# Patient Record
Sex: Female | Born: 1950 | Race: White | Hispanic: No | Marital: Married | Smoking: Former smoker
Health system: Southern US, Community
[De-identification: ages and names within clinical notes are randomized; demographics above are authoritative.]

## PROBLEM LIST (undated history)

## (undated) DIAGNOSIS — R112 Nausea with vomiting, unspecified: Secondary | ICD-10-CM

## (undated) DIAGNOSIS — K219 Gastro-esophageal reflux disease without esophagitis: Secondary | ICD-10-CM

## (undated) DIAGNOSIS — R51 Headache: Secondary | ICD-10-CM

## (undated) DIAGNOSIS — J302 Other seasonal allergic rhinitis: Secondary | ICD-10-CM

## (undated) DIAGNOSIS — F329 Major depressive disorder, single episode, unspecified: Secondary | ICD-10-CM

## (undated) DIAGNOSIS — J189 Pneumonia, unspecified organism: Secondary | ICD-10-CM

## (undated) DIAGNOSIS — F419 Anxiety disorder, unspecified: Secondary | ICD-10-CM

## (undated) DIAGNOSIS — M1990 Unspecified osteoarthritis, unspecified site: Secondary | ICD-10-CM

## (undated) DIAGNOSIS — D649 Anemia, unspecified: Secondary | ICD-10-CM

## (undated) DIAGNOSIS — Z8489 Family history of other specified conditions: Secondary | ICD-10-CM

## (undated) DIAGNOSIS — Z9889 Other specified postprocedural states: Secondary | ICD-10-CM

## (undated) DIAGNOSIS — M797 Fibromyalgia: Secondary | ICD-10-CM

## (undated) HISTORY — PX: SHOULDER ARTHROSCOPY: SHX128

## (undated) HISTORY — PX: BACK SURGERY: SHX140

## (undated) HISTORY — PX: NECK SURGERY: SHX720

---

## 1982-02-01 HISTORY — PX: ABDOMINAL HYSTERECTOMY: SHX81

## 1998-02-01 DIAGNOSIS — F419 Anxiety disorder, unspecified: Secondary | ICD-10-CM

## 1998-02-01 DIAGNOSIS — F32A Depression, unspecified: Secondary | ICD-10-CM

## 1998-02-01 HISTORY — DX: Depression, unspecified: F32.A

## 1998-02-01 HISTORY — DX: Anxiety disorder, unspecified: F41.9

## 1998-10-15 ENCOUNTER — Encounter: Payer: Self-pay | Admitting: Obstetrics and Gynecology

## 1998-10-15 ENCOUNTER — Ambulatory Visit (HOSPITAL_COMMUNITY): Admission: RE | Admit: 1998-10-15 | Discharge: 1998-10-15 | Payer: Self-pay | Admitting: Obstetrics and Gynecology

## 1999-04-09 ENCOUNTER — Encounter: Payer: Self-pay | Admitting: Neurosurgery

## 1999-04-09 ENCOUNTER — Inpatient Hospital Stay (HOSPITAL_COMMUNITY): Admission: RE | Admit: 1999-04-09 | Discharge: 1999-04-10 | Payer: Self-pay | Admitting: Neurosurgery

## 1999-04-28 ENCOUNTER — Encounter: Payer: Self-pay | Admitting: Neurosurgery

## 1999-04-28 ENCOUNTER — Encounter: Admission: RE | Admit: 1999-04-28 | Discharge: 1999-04-28 | Payer: Self-pay | Admitting: Neurosurgery

## 1999-05-26 ENCOUNTER — Encounter: Admission: RE | Admit: 1999-05-26 | Discharge: 1999-05-26 | Payer: Self-pay | Admitting: Neurosurgery

## 1999-05-26 ENCOUNTER — Encounter: Payer: Self-pay | Admitting: Neurosurgery

## 1999-07-15 ENCOUNTER — Encounter: Admission: RE | Admit: 1999-07-15 | Discharge: 1999-07-15 | Payer: Self-pay | Admitting: Neurosurgery

## 1999-07-15 ENCOUNTER — Encounter: Payer: Self-pay | Admitting: Neurosurgery

## 1999-07-16 ENCOUNTER — Encounter: Admission: RE | Admit: 1999-07-16 | Discharge: 1999-07-16 | Payer: Self-pay | Admitting: Neurosurgery

## 1999-07-16 ENCOUNTER — Encounter: Payer: Self-pay | Admitting: Neurosurgery

## 1999-10-26 ENCOUNTER — Encounter: Admission: RE | Admit: 1999-10-26 | Discharge: 1999-11-13 | Payer: Self-pay | Admitting: Neurosurgery

## 1999-11-20 ENCOUNTER — Ambulatory Visit (HOSPITAL_COMMUNITY): Admission: RE | Admit: 1999-11-20 | Discharge: 1999-11-20 | Payer: Self-pay | Admitting: Obstetrics and Gynecology

## 1999-11-20 ENCOUNTER — Encounter: Payer: Self-pay | Admitting: Obstetrics and Gynecology

## 1999-11-25 ENCOUNTER — Encounter: Admission: RE | Admit: 1999-11-25 | Discharge: 1999-11-25 | Payer: Self-pay | Admitting: Obstetrics and Gynecology

## 1999-11-25 ENCOUNTER — Encounter: Payer: Self-pay | Admitting: Obstetrics and Gynecology

## 2001-02-01 HISTORY — PX: CHOLECYSTECTOMY: SHX55

## 2002-09-14 ENCOUNTER — Encounter: Payer: Self-pay | Admitting: Orthopedic Surgery

## 2002-09-14 ENCOUNTER — Ambulatory Visit (HOSPITAL_COMMUNITY): Admission: RE | Admit: 2002-09-14 | Discharge: 2002-09-14 | Payer: Self-pay | Admitting: Orthopedic Surgery

## 2003-02-02 HISTORY — PX: ERCP W/ SPHINCTEROTOMY AND BALLOON DILATION: SHX1524

## 2003-02-13 ENCOUNTER — Emergency Department (HOSPITAL_COMMUNITY): Admission: EM | Admit: 2003-02-13 | Discharge: 2003-02-13 | Payer: Self-pay | Admitting: Emergency Medicine

## 2003-03-20 ENCOUNTER — Emergency Department (HOSPITAL_COMMUNITY): Admission: EM | Admit: 2003-03-20 | Discharge: 2003-03-20 | Payer: Self-pay | Admitting: Emergency Medicine

## 2003-03-20 ENCOUNTER — Encounter: Payer: Self-pay | Admitting: Gastroenterology

## 2003-03-28 ENCOUNTER — Inpatient Hospital Stay (HOSPITAL_COMMUNITY): Admission: RE | Admit: 2003-03-28 | Discharge: 2003-04-02 | Payer: Self-pay | Admitting: Gastroenterology

## 2003-03-28 ENCOUNTER — Encounter: Payer: Self-pay | Admitting: Gastroenterology

## 2003-04-02 ENCOUNTER — Encounter (INDEPENDENT_AMBULATORY_CARE_PROVIDER_SITE_OTHER): Payer: Self-pay | Admitting: *Deleted

## 2003-05-02 ENCOUNTER — Encounter: Payer: Self-pay | Admitting: Gastroenterology

## 2003-05-02 ENCOUNTER — Ambulatory Visit (HOSPITAL_COMMUNITY): Admission: RE | Admit: 2003-05-02 | Discharge: 2003-05-02 | Payer: Self-pay | Admitting: Gastroenterology

## 2003-10-14 ENCOUNTER — Encounter: Admission: RE | Admit: 2003-10-14 | Discharge: 2003-10-14 | Payer: Self-pay | Admitting: Orthopedic Surgery

## 2003-12-06 ENCOUNTER — Ambulatory Visit: Payer: Self-pay | Admitting: Gastroenterology

## 2004-11-09 ENCOUNTER — Encounter: Admission: RE | Admit: 2004-11-09 | Discharge: 2004-11-09 | Payer: Self-pay | Admitting: Specialist

## 2004-11-19 ENCOUNTER — Ambulatory Visit (HOSPITAL_COMMUNITY): Admission: RE | Admit: 2004-11-19 | Discharge: 2004-11-20 | Payer: Self-pay | Admitting: Specialist

## 2005-10-05 ENCOUNTER — Inpatient Hospital Stay (HOSPITAL_COMMUNITY): Admission: RE | Admit: 2005-10-05 | Discharge: 2005-10-06 | Payer: Self-pay | Admitting: Neurosurgery

## 2006-01-03 ENCOUNTER — Encounter: Admission: RE | Admit: 2006-01-03 | Discharge: 2006-01-03 | Payer: Self-pay | Admitting: Specialist

## 2006-01-21 ENCOUNTER — Encounter: Admission: RE | Admit: 2006-01-21 | Discharge: 2006-01-21 | Payer: Self-pay | Admitting: Specialist

## 2006-03-10 ENCOUNTER — Inpatient Hospital Stay (HOSPITAL_COMMUNITY): Admission: RE | Admit: 2006-03-10 | Discharge: 2006-03-15 | Payer: Self-pay | Admitting: Specialist

## 2007-04-25 ENCOUNTER — Encounter: Payer: Self-pay | Admitting: Gastroenterology

## 2007-10-27 ENCOUNTER — Encounter: Admission: RE | Admit: 2007-10-27 | Discharge: 2007-10-27 | Payer: Self-pay | Admitting: Occupational Medicine

## 2007-11-06 ENCOUNTER — Encounter: Admission: RE | Admit: 2007-11-06 | Discharge: 2007-11-06 | Payer: Self-pay | Admitting: Internal Medicine

## 2007-11-15 ENCOUNTER — Encounter: Admission: RE | Admit: 2007-11-15 | Discharge: 2007-11-15 | Payer: Self-pay | Admitting: Occupational Medicine

## 2007-12-13 ENCOUNTER — Encounter: Admission: RE | Admit: 2007-12-13 | Discharge: 2007-12-13 | Payer: Self-pay | Admitting: Specialist

## 2007-12-20 ENCOUNTER — Encounter: Admission: RE | Admit: 2007-12-20 | Discharge: 2007-12-20 | Payer: Self-pay | Admitting: Specialist

## 2008-11-04 ENCOUNTER — Encounter (INDEPENDENT_AMBULATORY_CARE_PROVIDER_SITE_OTHER): Payer: Self-pay | Admitting: *Deleted

## 2008-11-06 ENCOUNTER — Ambulatory Visit (HOSPITAL_COMMUNITY): Admission: RE | Admit: 2008-11-06 | Discharge: 2008-11-06 | Payer: Self-pay | Admitting: Specialist

## 2008-11-14 DIAGNOSIS — M5030 Other cervical disc degeneration, unspecified cervical region: Secondary | ICD-10-CM | POA: Insufficient documentation

## 2008-11-14 DIAGNOSIS — R51 Headache: Secondary | ICD-10-CM | POA: Insufficient documentation

## 2008-11-14 DIAGNOSIS — Z8659 Personal history of other mental and behavioral disorders: Secondary | ICD-10-CM | POA: Insufficient documentation

## 2008-11-14 DIAGNOSIS — R519 Headache, unspecified: Secondary | ICD-10-CM | POA: Insufficient documentation

## 2008-11-20 ENCOUNTER — Ambulatory Visit: Payer: Self-pay | Admitting: Gastroenterology

## 2008-11-20 DIAGNOSIS — K859 Acute pancreatitis without necrosis or infection, unspecified: Secondary | ICD-10-CM | POA: Insufficient documentation

## 2008-11-20 DIAGNOSIS — N9489 Other specified conditions associated with female genital organs and menstrual cycle: Secondary | ICD-10-CM | POA: Insufficient documentation

## 2009-11-10 IMAGING — CT CT HEAD W/O CM - CT MAXILLOFACIAL W/O CM
3 series · 17 of 47 positions shown, 20 images · non-contrast
Comparison: NONE

CLINICAL DATA: Chronic sinusitis. 

CT PARANASAL SINUSES
TECHNIQUE: Axial, sagittal, and coronal images were obtained.

[Series 3: 3x3 · axial · 0.24mm/px · z∈[+69,+165]mm · 11 of 38 slices shown, 14 images]
[im 3/38  brain]
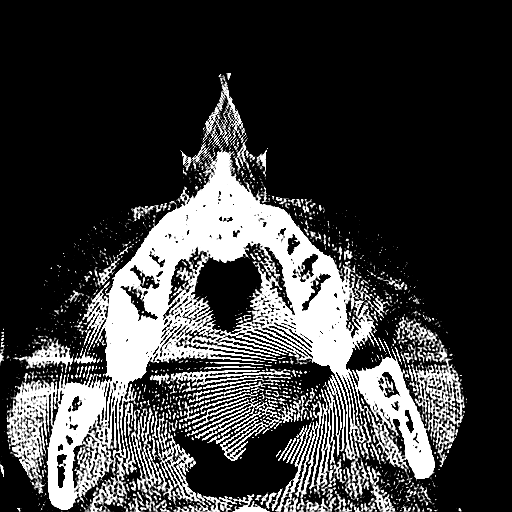
[im 3/38  bone]
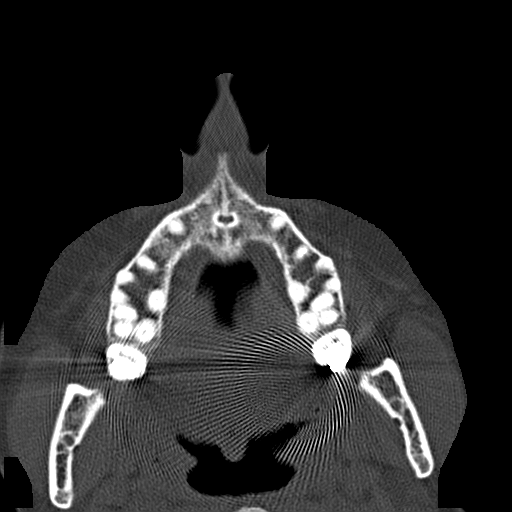
[im 6/38  bone]
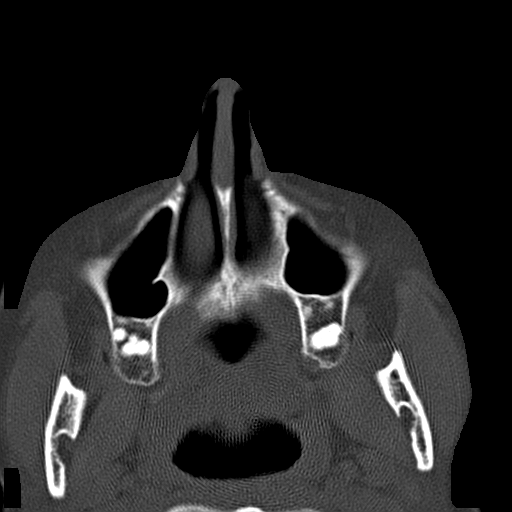
[im 8/38  bone]
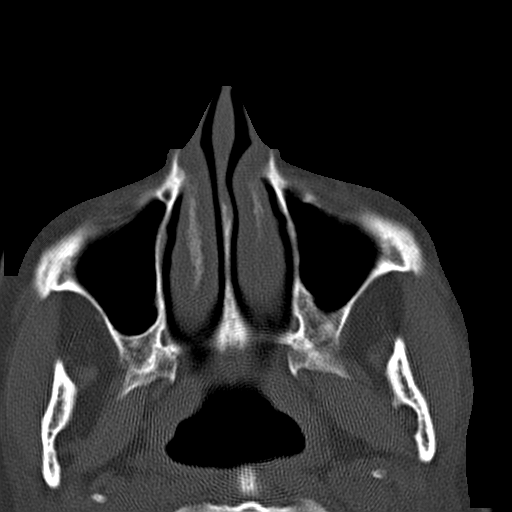
[im 11/38  bone]
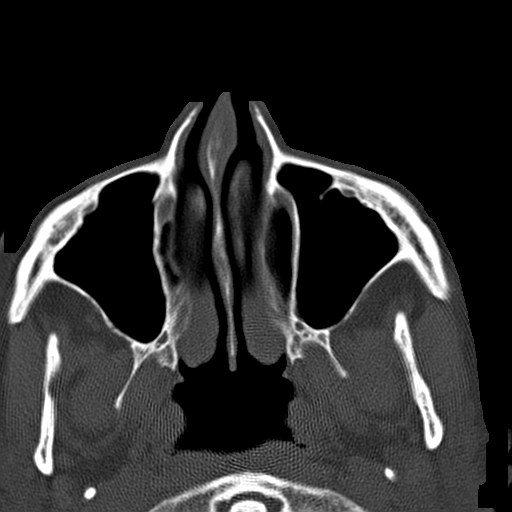
[im 15/38  brain]
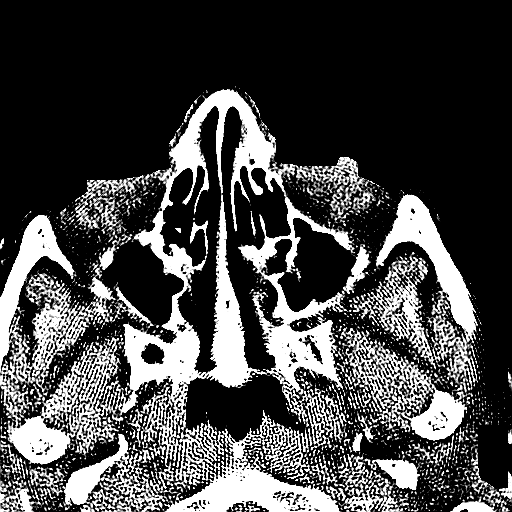
[im 15/38  bone]
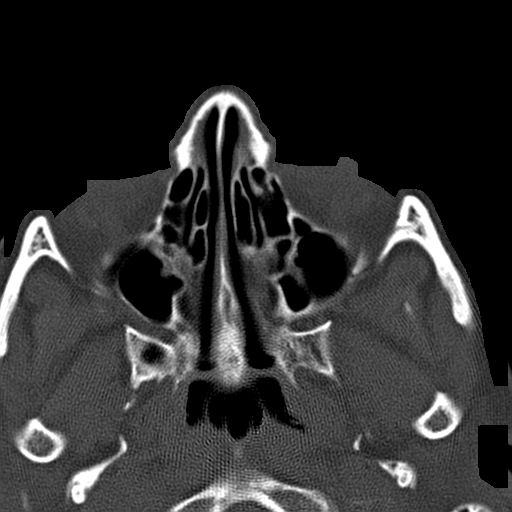
[im 17/38  bone]
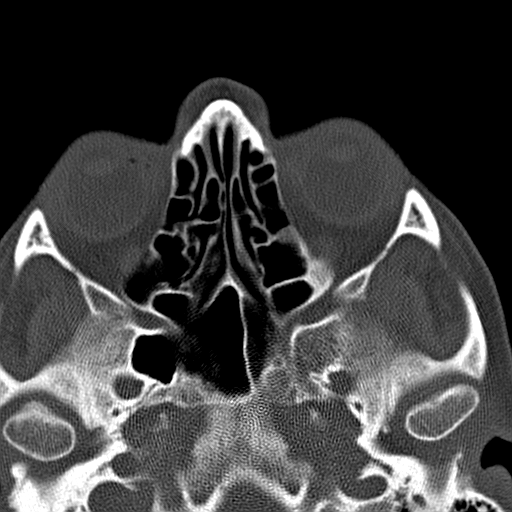
[im 20/38  bone]
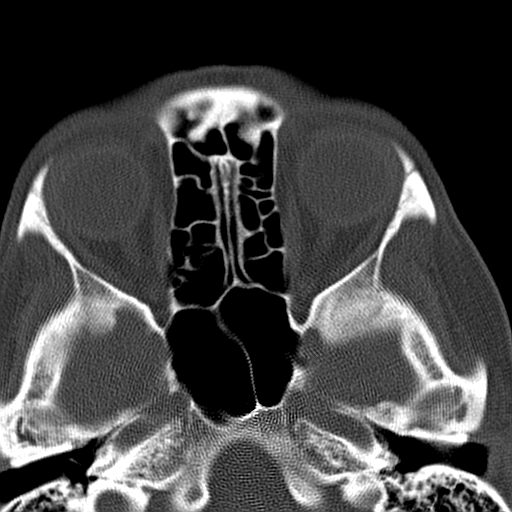
[im 23/38  bone]
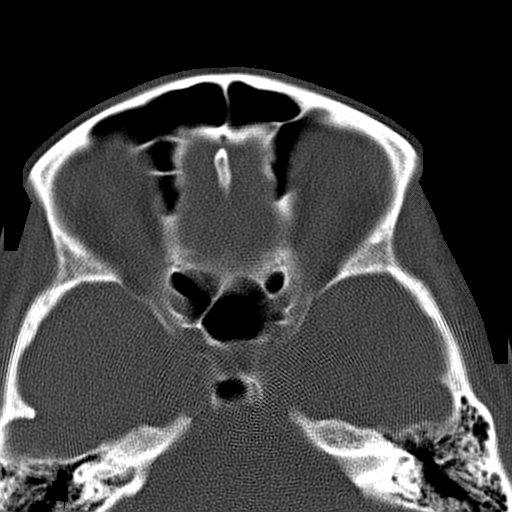
[im 26/38  brain]
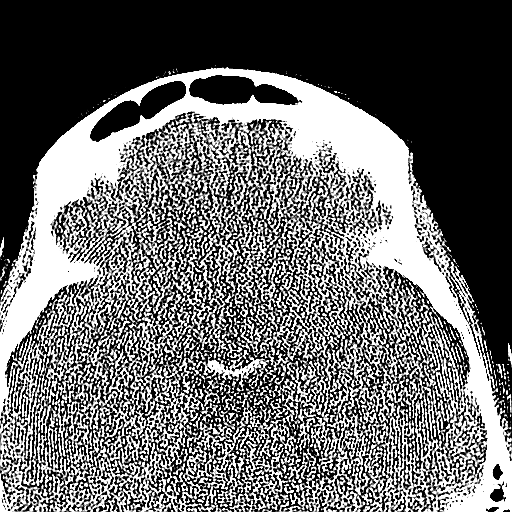
[im 26/38  bone]
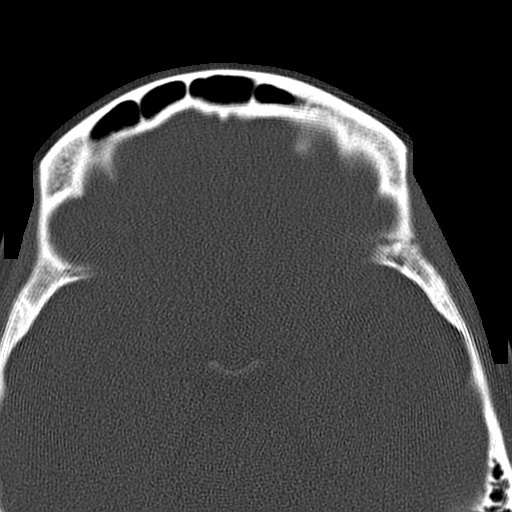
[im 32/38  bone]
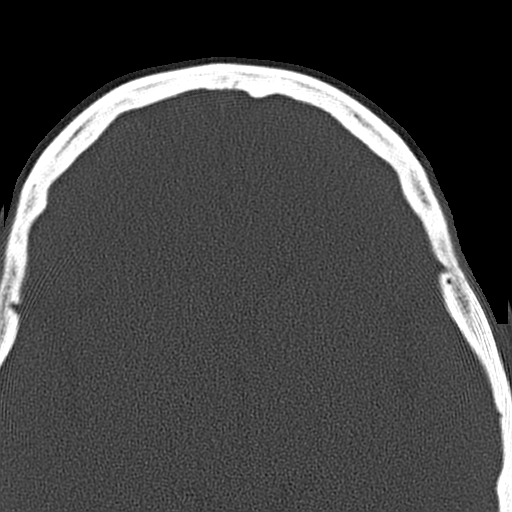
[im 35/38  bone]
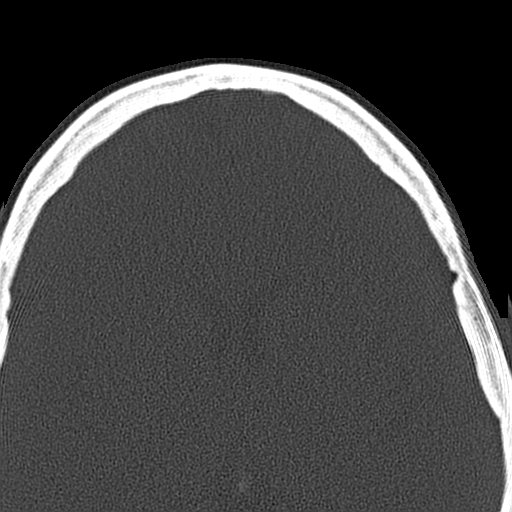

[coronals · coronal · 0.24mm/px · 3 of 32 slices shown]
[im 11/32  bone]
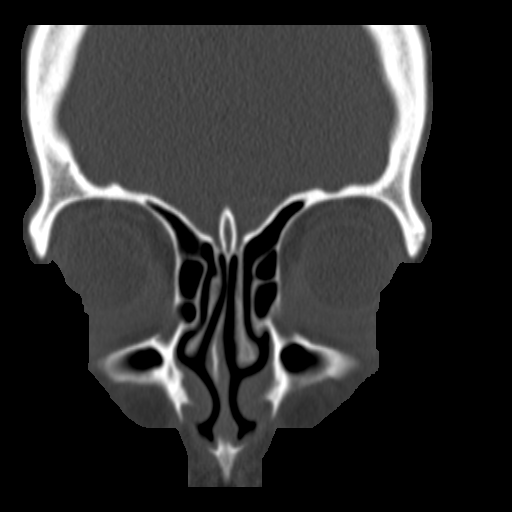
[im 14/32  bone]
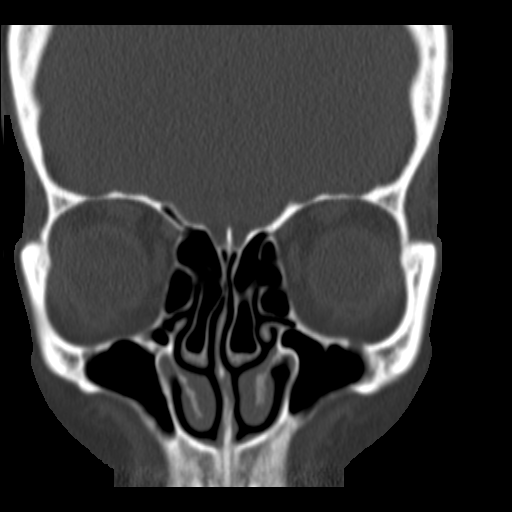
[im 18/32  bone]
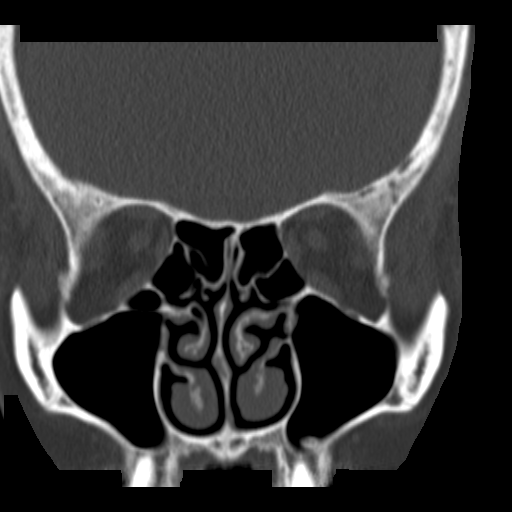

[sagittals · sagittal · 0.24mm/px · 3 of 41 slices shown]
[im 14/41  bone]
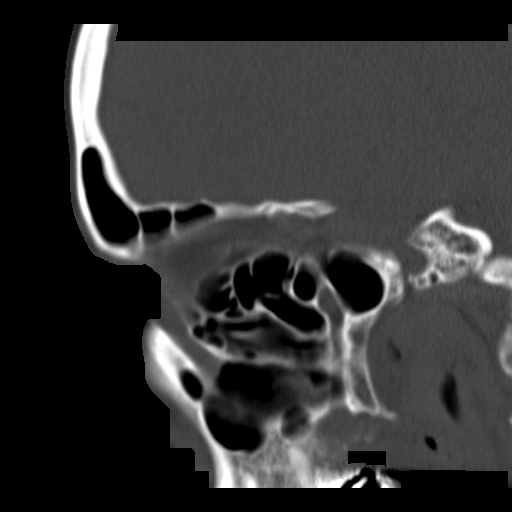
[im 21/41  bone]
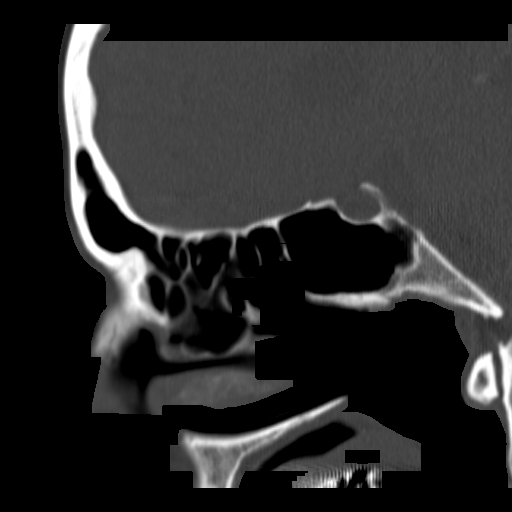
[im 27/41  bone]
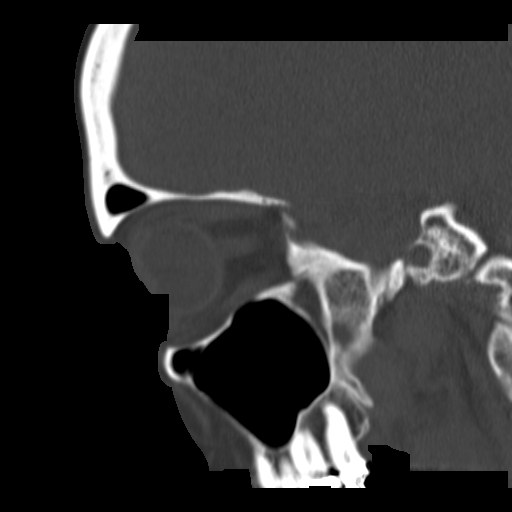

[17 of 47 positions shown; findings below may reference images not displayed]

FINDINGS: No evidence of mucosal thickening or air-fluid level in 
the paranasal sinuses.  Orbits and visualized portions of the 
mastoids are unremarkable. No evidence of occlusion or polyposis 
of the nasal passage.  Small concha bullosa are present.  Minimal 
rightward nasal septal deviation.  Infundibula at the OMC are 
patent bilaterally. No fractures, lytic or blastic lesions are 
identified.
IMPRESSION: Minimal rightward nasal septal deviation.  No 
air-fluid levels or mucosal thickening. Fladby, Hans Ragnar 
electronically reviewed on 08/28/2007 Dict Date: 08/26/2007  Tran 
Date:  08/28/2007 DAS  JLM

## 2010-02-21 ENCOUNTER — Encounter: Payer: Self-pay | Admitting: Gastroenterology

## 2010-05-07 LAB — BASIC METABOLIC PANEL
BUN: 12 mg/dL (ref 6–23)
CO2: 31 mEq/L (ref 19–32)
Calcium: 9.2 mg/dL (ref 8.4–10.5)
Chloride: 101 mEq/L (ref 96–112)
GFR calc Af Amer: >60 mL/min (ref >60)
Glucose, Bld: 111 mg/dL — ABNORMAL HIGH (ref 70–99)
Potassium: 4.6 mEq/L (ref 3.5–5.1)
Sodium: 137 mEq/L (ref 135–145)

## 2010-05-07 LAB — CBC: RDW: 13.1 % (ref 11.5–15.5)

## 2010-06-19 NOTE — H&P (Signed)
[place]  Patient:    [name], [name]                          MRN: [id number] Adm. Date:  [id number] Attending:  Colon Branch                         History and Physical  CHIEF COMPLAINT:  Neck pain and right upper extremity pain and numbness.  HISTORY:  Patient is a 60 year old woman who since the summer has been having more and more neck pain and as the months have been going by, it has been radiating o her right shoulder and down her right arm, usually stopping just past the elbow but she gets numbness into the thumb, index and middle fingers.  She never has left-sided symptoms.  If she moves her head fast in extension and tilting to the left, she can get electric shock-type feeling down her neck and to the right arm and leg.  She has noticed a change in her handwriting, with becoming a bit sloppier, and some problems doing small things with her hands and some instability in her gait and noted some right arm weakness also.  She has been on Vioxx p.r.n., which has not helped things.  Steroid Dosepak has not helped things either. MRI of the neck showed spondylosis at 4-5 and 5-6 with a left disc herniation at 4-5 with spinal stenosis at both levels due to a central disc herniation at 5-6 and a left-sided disc herniation at 4-5 and some foraminal narrowing bilaterally. When compared to films from 1999, there is a lot of worsening from those films. Patient is admitted for surgery.  PAST MEDICAL HISTORY:  Negative.  PREVIOUS SURGERIES:  Hysterectomy in 1986.  Cyst removed from the left arm in the past.  MEDICATIONS:  Paxil, Prevacid, estrogen replacement, Vioxx.  ALLERGIES:  No known drug allergies.  SOCIAL HISTORY:  She says she does not smoke.  REVIEW OF SYSTEMS:  Significant for some low back pain.  FAMILY HISTORY:  Noncontributory.  PHYSICAL EXAMINATION:  GENERAL:  Patient is pleasant.  HEENT:  Unremarkable.  LUNGS:   Clear.  HEART:  Regular rhythm.  ABDOMEN:  Soft and nontender.  EXTREMITIES:  Intact.  No edema.  NEUROLOGIC:  Exam shows range of motion of the neck decreased slightly in flexion and extension due to increased in neck pain.  Spurlings maneuver to the left causes a little left-sided neck pain but no radicular-type symptoms; to the right does cause right arm pain.  She also has some increased numbness in her fingers. There is some right brachial plexus tenderness; negative left.  Motor strength s 4+/5 in right biceps; 5/5 everywhere else.  Sensation:  Slight decrease to pinprick in the right C6 and 7 distribution.  Light touch is intact.  Deep tendon reflexes are 3+/4 in the upper extremities, maybe slightly decreased in the right biceps. No clonus.  Straight leg raise is negative.  Gait is normal.  Tandem gait is done fairly well.  Rapid alternating movements are decreased on the right side.  She is left-handed.  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  Patient is admitted for two-level anterior cervical diskectomy and fusion. DD:  04/09/99 TD:  04/09/99 Job: [id number]

## 2010-06-19 NOTE — H&P (Signed)
[name], [name]                 ACCOUNT NO.:  [id number]   MEDICAL RECORD NO.:  [id number]          PATIENT TYPE:  INP   LOCATION:  NA                           FACILITY:  [facility]   PHYSICIAN:  Jene Every, M.D.    DATE OF BIRTH:  October 06, 1950   DATE OF ADMISSION:  DATE OF DISCHARGE:                              HISTORY & PHYSICAL   CHIEF COMPLAINT:  Hip pain and pain down the right lower extremity.   HISTORY:  [name] is a pleasant 60 year old female who has been seeing  Dr. Shelle Iron for quite some time with back and right lower extremity pain.  She also, during this time, had some issues with her previous fusion in  her cervical spine that was done by Dr. Phoebe Perch and she actually had to  have a revision on this.  We were hopeful that this would rid her of her  symptoms, but unfortunately, it did not.  She had also had a lumbar  decompression back in 2006 and did well for quite some time.  The  patient has had persistent pain.  Multiple studies were ordered which  showed lateral re-stenosis dynamic at L5-S1.  She underwent an epidural  steroid injection with selective nerve root block with minimal relief.  We then proceeded with CT myelogram which showed nitrous gas adjacent to  the S1 nerve root and within the L5-S1 disc space. We also felt at this  time that she would need a discogram.  This was obtained and confirms.  She has a dynamic compression of the L5 nerve root as well as  retrolisthesis at L5-S1.  Exam does show positive straight leg raise  with EHL weakness on the right as compared to the left. She does have  pain with forward flexion and extension of the lumbar spine.  It is felt  at this point that she would benefit from a lumbar fusion utilizing  pedicle screw instrumentation.  The risks and benefits of this were  discussed with the patient and she does elect to proceed.   PAST MEDICAL HISTORY:  1. Gastroesophageal reflux disease.  2. Seasonal allergies.  3.  Migraines.   CURRENT MEDICATIONS:  1. Prilosec 20 mg 1 p.o. daily.  2. Premarin 0.625 mg 1 p.o. daily.  3. Allegra 180 mg 1 p.o. daily.  4. Vitamins.  5. Vicodin 7.5 mg 1-2 p.o. q 4-6 p.r.n. pain.  6. Imitrex 100 mg 1 p.o. p.r.n. migraines.   PREVIOUS SURGERIES:  1. Hysterectomy in 1986.  2. Microdiskectomy in the neck in 2001 as well as revision of her      fusion of the neck in September 2007.  3. ERCP and sphincterotomy in 2005.  4. Microdiskectomy lumbar in 2006.  5. Hemorrhoidectomy in 2000.  6. Cholecystectomy in 2003.   SOCIAL HISTORY:  The patient is divorced.  She works as a Chartered certified accountant.  She denies any tobacco or alcohol history.  She has one child.  She has  multiple care givers following her surgery.  She lives in a one story  home with two entry stairs.  FAMILY HISTORY:  Significant for coronary artery disease, diabetes, CVA  as well as bleeding ulcers.  These are all her mother's ailments.   REVIEW OF SYSTEMS:  GENERAL:  The patient denies any fever, chills,  night sweats or bleeding tendencies.  CNS:  No blurred or double vision,  seizures, headache or paralysis.  RESPIRATORY:  No shortness of breath,  productive cough or hemoptysis.  CARDIOVASCULAR:  Chest no angina or  orthopnea.  GU:  No dysuria, hematuria or discharge.  GI:  No nausea,  vomiting, diarrhea, constipation, melena or bloody stools.  MUSCULOSKELETAL:  Pertinent HPI.   PHYSICAL EXAMINATION:  VITAL SIGNS:  Pulse 60, respiratory 16, blood  pressure 98/68.  GENERAL:  This is a well-developed, well-nourished female in no acute  distress.  HEENT:  Atraumatic, normocephalic.  Pupils equal, round and reactive to  light.  EOMs intact.  NECK:  Supple.  No lymphadenopathy.  The patient does have globally  decreased range of motion.  CHEST:  Clear to auscultation bilaterally and no rhonchi, wheezes or  rales.  HEART:  Regular rate and rhythm without murmurs, rubs or gallops.  ABDOMEN:  Soft, nontender  and non-distended.  Bowel sounds times four.  SKIN:  No rashes or lesions are noted.  MUSCULOSKELETAL:  The patient does have global tenderness along the  lumbosacral junction.  She has a positive straight leg raise on the  right which produces buttock pain.  Motor is 5/5.  Sensation is intact.   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   PLAN:  The patient will be admitted to [facility] to undergo  decompression of L5-S1 with transluminal intra-body fusion using pedicle  screw instrumentation, lateral mass fusion with autologous and allograft  bone.      Roma Schanz, P.A.      Jene Every, M.D.  Electronically Signed    CS/MEDQ  D:  03/04/2006  T:  03/05/2006  Job:  [id number]

## 2010-06-19 NOTE — Op Note (Addendum)
[name], [name]                 ACCOUNT NO.:  [id number]   MEDICAL RECORD NO.:  [id number]          PATIENT TYPE:  INP   LOCATION:  2899                         FACILITY:  [facility]   PHYSICIAN:  Clydene Fake, M.D.  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   PROCEDURES:  Exploration of prior fusion, with removal of previous Codman  plate, with redo anterior cervical decompression, diskectomy and fusion of  C5-6, with Leibinger allograft bone and Eagle anterior cervical plate.   SURGEON:  Clydene Fake, M.D.   ASSISTANT:  Makaela [name], M.D.   ANESTHESIA:  General endotracheal tube anesthesia.   ESTIMATED BLOOD LOSS:  Minimal.   BLOOD GIVEN:  None.   DRAINS:  None.   COMPLICATIONS:  Dural rent occurred, repaired with Duragen and Tisseel.   REASON FOR PROCEDURE:  The patient is a 60 year old woman, who underwent 2-  level AICF in 1990, has had some neck pain since, but over the last year,  there has been an increase in neck pain with right-sided radicular symptoms,  more of the C6 type distribution.  X-rays in flexion and extension, and MRI  C spine showed a nonunion at 5-6, good fusion at 4-5, and some osteophytes  at ____ QA MARKER: 97 ____ at 5-  exploration of prior cervical fusion and probable redo fusion of 5-6.  Of  note, the screws at C6 in the plate on one side looked broken, and the other  had pulled out just slightly, also consistent with nonunion.   PROCEDURE IN DETAIL:  The patient was brought to the operating room and  general anesthesia was induced.  The  patient was placed in 5 pounds halter  traction, prepared and draped in a sterile fashion at the site of incision.  Incision was made at the site of the previous incision in the midline to the  anterior border of the sternocleidomastoid on the left side of the neck.  The incision was taken down to the platysma, and hemostasis was obtained  with the Bovie and was excellent.  The platysma was incised with the Bovie,  and blunt dissection taken through the anterior cervical fascia to the  anterior cervical spine.  When we dissected down to find the plate, it was  covered with some scar tissue.  I incised the scar tissue, carefully  dissected the scar tissue off the screw holes.  We removed the right C6  screw, which was partially already out and loose.  We then found a broken  screw on the left side at just C6, and we took out multiple fragments of the  fractured screwhead and the fractured interlocking screw.  We were not able  to remove the rest of the screw that remained embedded into bone, and we  left that there.  We removed the screws from C4 and removed the plate.  The  4-5 fusion area was explored.  No movement was seen, and good fusion.  C5-6,  on the other hand, had a soft area where I easily placed a needle.  We left  that in place and took an x-ray, and this showed this was at the 5-6 disk  space.  We had some motion around this area, and we incised this area and  did a partial diskectomy with pituitary rongeurs and curets. A self-  retaining retractor was then placed, removing the longus colli laterally  using the Bovie.  Placed distraction pins into C5 and C6 and distracted the  interspace; it easily opened up at the area of nonunion.  A high-speed drill  was then used to drill through this scar tissue down to the posterior  vertebra, where 1 and 2-mm Kerrison punches and nerve hooks and curets were  used to continue the diskectomy.  We found the dura and dissected out  laterally  to the right side, performing a foraminotomy.  As we did this, we  noticed on the left side that a dural rent occurred.  At first, there was  just an outpouching of arachnoid with no CSF.  We did a foraminotomy over  the nerve roots.  When we were finished, we had good decompression of the  central canal and bilateral foramina.  No CSF did eventually leak through  this arachnoid outpouching.  We drilled the endplates back to good bone, and  noted the height of the disk space to be 4 mm.  We had good hemostasis with  Gelfoam and thrombin.  With irrigated with antibiotic solution.  We then  placed a piece of Duragen over the area of the dural rent, and then placed a  small amount of Tisseel over this.  A 4-mm length of allograft was then  tapped into place and countersunk by about 0.5 mm.  And we placed Tisseel in  our posterior interbody gutters over the bone graft.  We had no further leak  of CSF throughout the case.  The distraction pins were removed.  Hemostasis  was obtained with Gelfoam and thrombin.  The weight was removed from the  traction and we had good position of bone graft, and an Eagle anterior  cervical plate was placed over the anterior cervical spine.  Two screws were  placed into C5 and two into C6, and these were all tightened down.  X-rays  were obtained, showing good position of plate and screws and bone plug.  The  distractors were removed.  Hemostasis was obtained with bipolar  cauterization and Gelfoam and thrombin.  The Gelfoam was irrigated out with  ____ QA MARKER: 377 ____, and   plate was removed and clean hemostasis.  When we were finished, we had good  hemostasis.  We irrigated with antibiotic solution and the platysma was then  closed with 3-0 Vicryl interrupted suture.  The subcutaneous tissue was  closed with the same.  The skin was closed with benzoin Steri-Strips. Dressing was placed.  The patient was placed back into a cervical collar,  woken up from  anesthesia and transferred to the recovery room in stable  condition.           ______________________________  Clydene Fake, M.D.     JRH/MEDQ  D:  10/05/2005  T:  10/05/2005  Job:  [id number]   cc:   [name], M.D.

## 2010-06-19 NOTE — Discharge Summary (Signed)
NAME:  [name], [name]                           ACCOUNT NO.:  [id number]   MEDICAL RECORD NO.:  [id number]                   PATIENT TYPE:  INP   LOCATION:  0451                                 FACILITY:  [facility]   PHYSICIAN:  Malcolm T. Russella Dar, M.D. [facility]          DATE OF BIRTH:  24-Jun-1950   DATE OF ADMISSION:  03/28/2003  DATE OF DISCHARGE:  04/02/2003                                 DISCHARGE SUMMARY   ADMISSION DIAGNOSES:  7. A 60 year old female with acute post ERCP pancreatitis.  2. History of biliary dyskinesia with elevated liver function studies     associated with colicky abdominal pain, now status post ERCP,     sphincterotomy, and balloon pull through earlier on the day of admission.  3. History of depression.  4. History of peripheral neuropathy.  5. Gastroesophageal reflux disease with significant symptom history,     breakthrough symptoms, and intermittent dysphagia consistent with     stricture.  6. Status post cholecystectomy in 2000.   DISCHARGE DIAGNOSES:  1. Resolved acute post ERCP pancreatitis.  2. Anemia, normocytic, secondary to above.  3. Low grade fever, resolved, secondary to above.   CONSULTATIONS:  None.   PROCEDURE:  None.   BRIEF HISTORY:  [name] is a pleasant 60 year old white female with long  history of GERD and has been treated with proton-pump inhibitors for the  past seven years. She has also been on Reglan over the past six months,  complains of intermittent heartburn and indigestion despite this, and also  has intermittent dysphagia. She was recently evaluated in the office by Dr.  Arlyce Dice for a more acute problem of episodic epigastric pain associated with  nausea, vomiting, and diaphoresis. The patient had an initial episode the  week before Christmas and then in mid February had another bout of very  similar symptoms. She was seen in the emergency room at [facility] where  she had an abdominal ultrasound showing her post cholecystectomy  state; no  other abnormalities.  She was noted to have mildly elevated LFTs with an  alkaline phosphatase of 123, AST 222, ALT 103, and a bilirubin of 0.5. She  was referred to Dr. Arlyce Dice, seen and evaluated, and felt to have symptoms  consistent with biliary dyskinesia. She was therefore set up for ERCP with  sphincterotomy which was done on the day of this admission. The procedure  was uncomplicated. Sphincterotomy was done and balloon sweep of the bile  duct. There was no evidence of choledocholithiasis. The patient developed  acute abdominal pain fairly immediately post procedure which she described  as being similar to her prior attacks. She was given pain medication and  antiemetics without relief, and was therefore admitted to the hospital with  probable post ERCP pancreatitis for supportive management and pain control.   LABORATORY STUDIES:  On March 28, 2003, WBC of 11.9, hemoglobin 11.9,  hematocrit 35.8, MCV 96.2, platelet count  212,000. Serial values were  obtained on April 01, 2003. WBC was 8, hemoglobin 9.9, hematocrit 29.6.  On April 02, 2003, WBC 6.5, hemoglobin 10.4, hematocrit 31.5, platelet count  314,000. On admission, March 28, 2003, potassium was 3.2 and this was  corrected to 4.6, albumin 3.8. Liver function studies on admission were  within normal limits. Amylase on admission was 1015, lipase 1450.  Follow-up  on March 29, 2003, lipase of 690; follow-up of March 31, 2003, lipase  of 20 and amylase 28; follow-up on April 02, 2003, lipase of 22.   X-RAY STUDIES:  KUB on admission was negative. There was gaseous distention  of the stomach and a few loops of small bowel felt related to insufflation  at the time of ERCP and mild bibasilar atelectasis.   HOSPITAL COURSE:  The patient was admitted to the service of Dr. Melvia Heaps. She was initially kept NPO. She was continued on IV fluids at 200 cc  an hour. She was given a Demerol PCA and Phenergan as  needed for nausea as  well as IV Reglan.  Her initial labs were consistent with post ERCP  pancreatitis. She had a fairly benign hospital course. She felt much better  the following day. Her PCA was discontinued and she was given p.r.n. Demerol  for pain control. On March 30, 2003, she continued to complain of  abdominal pain, had a low grade temperature of 100, and was not felt ready  for discharge due to continued pain diet was backed off.  On March 31, 2003, she was improving. She had a low grade temperature, but was overall  feeling stronger. She was placed back on clear liquids. On April 01, 2003, she was advance to full liquids, seemed to tolerate this without  difficulty, remained afebrile, and was able to be up and ambulatory. On  April 02, 2003, she was feeling much better, tolerating oral Vicodin for pain  and full liquids without difficulty. She was afebrile, white count normal at  6.5, and lipase normalized at 22. She was discharged in stable and improved  condition with instructions to follow up with Dr. Arlyce Dice in the office on  April 23, 2003, to call for any problems in the interim. She is to maintain  a  low fat diet.   MEDICATIONS:  Vicodin 5/500 one p.o. q.6h. p.r.n. pain, Phenergan 25 q.6h.  p.r.n., and her other home medications as previous including Nexium,  imipramine, Neurontin, Reglan, and Premarin.   She is advised to remain out of work for the remainder of the week.     [name], P.A.-C. [facility]                Malcolm T. Russella Dar, M.D. [facility]    AE/MEDQ  D:  04/08/2003  T:  04/08/2003  Job:  [id number]

## 2010-06-19 NOTE — Op Note (Signed)
[place]  Patient:    [name], [name]                          MRN: [id number] Proc. Date: 04/09/99 Adm. Date:  [id number] Disc. 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  PROCEDURE:  Anterior cervical diskectomy and fusion at C4-5 and 5-6, allograft anterior cervical plate and tong traction.  SURGEON:  Clydene Fake, M.D.  ANESTHESIA:  INDICATIONS FOR PROCEDURE:  The patient has had progressive symptoms of neck pain, and some right-sided radiculopathy, and mild myelopathic problems with change in hearing, and mild gait instability.  Gotten progressive over the last couple of  years.  MRI has shown worsening canal stenosis and central herniation that is causing pressure on the cord, and the patient is brought in for decompressive surgery.  DESCRIPTION OF PROCEDURE:  The patient was brought to the operating room and general anesthesia was induced.  The patient was placed in Gardner-Wells tongs, and was prepped and draped in a sterile fashion.  The site of the incision on the left side of the neck was injected with 8 cc of 1% lidocaine with epinephrine.  An incision was then made from the midline to the anterior border of the sternocleidomastoid and the left side of the neck.  Dissection taken down to the platysma.  Hemostasis was obtained with Bovie cauterization.  The Bovie was used to incise the platysma, and then blunt dissection was taken down through the cervical fascia to the anterior cervical spine.  A needle was placed in the disk space and x-ray obtained showing this was the 4-5 interspace.  This interspace was then incised with a 15  blade and partial diskectomy performed.  The longus colli muscle was then elevated bilaterally, and reflected laterally on each side from C4 through C6.  Self-retaining retractor system was then placed, and then both C4-5 and 5-6 disk spaces were incised with a 15 blade and diskectomy performed with pituitary rongeurs.  The Cloward drill guide was placed over the 4-5 interspace and then  10 mm power drill was used to drill through the disk space into the endplates, leaving a thin rim of posterior cortex.  This was repeated at the C5-6 level.  The microscope was brought into the case at this point for the microdissection.  The rest of the diskectomy was then performed using curets and pituitary rongeurs, and 1 mm and 2 mm Kerrison punches were then used to remove the thin rim of the  posterior cortex, posterior longitudinal ligament, and to complete decompressing the spinal cord, and then performing bilateral foraminotomies.  After this, there was no further pressure centrally, and both foramen were open.  This was done at C4-5 and then repeated at 5-6.  A depth gauge was used to measure the depth of vertebral bodies at both levels, and a 12 mm bone dial was cut to a few millimeters smaller in size for each level, and then tapped into place with traction on the  Gardner-Wells tongs.  We then checked under the bone graft and there was plenty of  room between dura and back of bone graft at each level.  The wound was irrigated with antibiotic solution.  We then used 37 mm Synthes small statured plate, placed over the C4 through C6 vertebral bodies, and two screws ere placed in C4 and two in the C6.  X-rays were obtained showing good position of oth bone grafts, plate, and screws.  Locking screws were then placed.  The wound was again irrigated with antibiotic solution, retractors removed, and hemostasis obtained with Gelfoam and thrombin, and bipolar cauterization.  The Gelfoam  was  then irrigated out with antibiotic solution.  The platysma was then closed with  3-0 Vicryl interrupted sutures, and the subcutaneous tissue was closed with the  same, and the skin was closed with a running 4-0 subcuticular Vicryl suture.  Steri-Strips were placed and a dressing was placed.  The patient had a soft cervical collar placed, the Gardner-Wells tongs removed, and she was awakened from anesthesia, and transferred to the recovery room in stable condition.DD: 04/09/99 TD:  04/11/99 Job: [id number]

## 2010-06-19 NOTE — Op Note (Signed)
[name], [name]                 ACCOUNT NO.:  [id number]   MEDICAL RECORD NO.:  [id number]          PATIENT TYPE:  AMB   LOCATION:  DAY                          FACILITY:  [facility]   PHYSICIAN:  [name], M.D.    DATE OF BIRTH:  01-07-1951   DATE OF PROCEDURE:  11/19/2004  DATE OF DISCHARGE:                                 OPERATIVE REPORT   PREOPERATIVE DIAGNOSIS:  Spinal stenosis and herniated nucleus pulposus at  L5-S1, right.   POSTOPERATIVE DIAGNOSIS:  Spinal stenosis and herniated nucleus pulposus at  L5-S1, right, epidural venous plexus.   PROCEDURES PERFORMED:  1.  Lateral recess decompression and foraminotomy of L5-S1, right.  2.  Microdiscectomy of L5-S1, right.  3.  Lysis of epidural venous plexus at L5-S1, right.   ANESTHESIA:  General.   ASSISTANT:  [name]. [name], M.D.   INDICATIONS:  This is a 60 year old with persistent right lower extremity  radicular pain in the S1 nerve root distribution with positive neural  tension sign refractory to conservative treatment for 2 years.  An epidural  steroid injection at L5-S1 temporarily relieved her symptoms.  MRI and  myelogram indicating a small disc protrusion at L5-S1 paracentral left  contacting the S1 nerve root.  The patient had persistent disabling  symptomatology, and based on known severe compression of the S1 nerve root  noted by CT myelogram and MRI, her clinical symptomatology, temporary relief  from a selective nerve root block, and a persistent disabling  symptomatology, I offered her a decompression at L5-S1 to the right.  I felt  that with the increased lumbosacral angle with slight retrolisthesis at L5  on L1, she most likely was demonstrating more positional neural compression.  The risks and benefits were discussed including infection, hemorrhage,  respiratory, CSF leakage, epidural fibrosis, need for fusion, anesthetic  complications, etc.   TECHNIQUE:  With the patient in the supine  position and after the induction  of adequate anesthesia and 1 g of Kefzol, she was placed peon on the [place]  frame.  All bony prominences were well padded.  The lumbar region was  prepped and draped in the usual sterile fashion.  Two 18-gauge spinal  needles were utilized to localize the L5-S1 interspace, confirmed with x-  ray.  The incision was made from spinous process of L5-S1.  The subcutaneous  tissue was dissected and electrocautery used to achieve hemostasis.  The  dorsolumbar fascia identified and divided in line with the skin incision.  Paraspinous muscles elevated from lamina at L5-S1.  A McCullough retractor  was placed and Penfield 4 in the intralaminar space, confirmed with x-ray.  Operating microscope was draped and brought onto the surgical field.  Ligamentum of flavum detached from the cephalad edge of S1 utilizing the  straight curette.  Foraminotomy at S1 was performed.  Epidural lipomatosis  was noted and significant ligamentum flavum in the lateral recess,  compressing the S1 nerve root.  Following the foraminotomy, ligamentum  flavum removed from the interspace with the neural elements well protected.  He nerve root was gently mobilized medially  and extensive epidural venous  plexus was noted, tethering the S1 nerve root.  This was cauterized with  bipolar electrocautery and divided.  Following removal of the ligamentum,  there was a small focal HNP noted as well.  Annulotomy performed.  Copious  portions of disc material are removed from the disc space with straight and  upbiting pituitary and a micropituitary from the subannular space, further  mobilized with a nerve hook.  After thorough discectomy of herniated  material, I examined the axilla of the nerve root and the shoulder beneath  the thecal sac.  No evidence of residual compression upon the root.  I can  see it is freely into the S1 and L5 foramen.  She had at least 1 cm of  excursion of the S1 nerve root  medial to the pedicle without difficulty.   Next, disc space copiously irrigated with antibiotic irrigation.  No  evidence of CSF leakage or active bleeding.  Epidural fat was placed over  the laminotomy defect with thrombin-soaked Gelfoam over that.  The  McCullough retractor was removed and paraspinous muscles inspected with no  evidence of active bleeding.  Dorsolumbar fascia reapproximated with 0  Vicryl interrupted figure-of-8 sutures.  The subcutaneous tissue  reapproximated with 2-0 Vicryl subcuticular sutures.  The skin is  reapproximated with 4-0 subcuticular Prolene.  Steri-Strips and sterile  dressing applied.  She was placed supine on the hospital bed, extubated  without difficulty, and transported to the recovery room in satisfactory  condition.   The patient tolerated the procedure well, no complication.  Minimal blood  loss.      [name], M.D.  Electronically Signed     JB/MEDQ  D:  11/19/2004  T:  11/19/2004  Job:  [id number]

## 2010-06-19 NOTE — Discharge Summary (Signed)
[place]  Patient:    [name], [name]                          MRN: [id number] Adm. Date:  [id number] Disc. Date: [id number] Attending:  Colon Branch                           Discharge Summary  ADMISSION DIAGNOSIS:  Spondylosis and herniated nucleus pulposus at C4-5 and C5-6 causing myelopathy and right-sided radiculopathy.  DISCHARGE DIAGNOSIS:  Spondylosis and herniated nucleus pulposus at C4-5 and C5-6 causing myelopathy and right-sided radiculopathy.  PROCEDURES:  Anterior cervical diskectomy and fusion at C4-5 and C5-6 with allograft anterior cervical plate and tong traction.  REASON FOR ADMISSION:  The patient is a 60 year old woman who continues to have neck pain, right arm pain and numbness, and some mild myelopathic symptoms.  MRI showed spondylosis at C4-5 and C5-6 with a left-sided disk herniation at C4-5 abutting the spinal cord with loss of CSF around the cord there and a C5-6 central disk herniation more to the right with flattening of the spinal cord.  There was some frontal narrowing bilaterally.  The patient was admitted for surgery.  HOSPITAL COURSE:  The patient was admitted the day of surgery and underwent the procedure named above without complication.  Postoperatively, the patient was transferred to the recovery room and then to the floor.  There she ate dinner, has been up ambulating, and on the first postoperative day here was eating breakfast.  She complains of a little bit of soreness in her throat and a little posterior neck pain, but the arms are feeling better as far as less numbness and pain.  She has done.  The dressing is clean, dry, and intact. She will be discharged home in stable condition.  DISCHARGE MEDICATIONS:  Same as prehospitalization, except no NSAIDs plus Vicodin ES one to two p.o. q.4-6h., 61 given, and Robaxin 500 mg p.o. q.6h. p.r.n. spasms.  ACTIVITY:  Keep the C-collar on at all times.  May  shower.  Keep incision dry for five days.  No heavy lifting.  No driving with collar on.  DIET:  As tolerated.  FOLLOW-UP:  Will be in three weeks in my office. DD:  04/10/99 TD:  04/11/99 Job: [id number]

## 2010-06-19 NOTE — Op Note (Signed)
NAMETYRIA, SPRINGER                 ACCOUNT NO.:  [id number]   MEDICAL RECORD NO.:  [id number]          PATIENT TYPE:  INP   LOCATION:  5028                         FACILITY:  [facility]   PHYSICIAN:  Jene Every, M.D.    DATE OF BIRTH:  December 06, 1950   DATE OF PROCEDURE:  03/10/2006  DATE OF DISCHARGE:                               OPERATIVE REPORT   PREOPERATIVE DIAGNOSES:  Recurrent disk herniation, L5-1, progressive  disk degeneration, L5-S1.   POSTOPERATIVE DIAGNOSES:  Recurrent disk herniation, L5-1, progressive  disk degeneration, L5-S1.   PROCEDURES PERFORMED:  1. Redo decompression, L5-S1.  2. Transforaminal lumbar interbody fusion, L5-S1, utilizing a 9-mm      spacer with autologous and autograft bone graft.  3. Pedicle screw instrumentation, L5-S1.  4. Lateral mass fusion utilizing autologous and autograft bone.  5. Continuous free-running electromyographic neural monitoring, 3      hours.   SURGEON:  Jene Every, M.D.   ASSISTANT:  Kerrin Champagne, M.D.   ANESTHESIA:  General.   BRIEF HISTORY AND INDICATIONS:  This is a 60 year old female, who was  status post decompression at L5-S1 with recurrent L5-S1 radiculopathy  and neural foraminal stenosis.  Diskogram positive at L5-S1, negative at  L4-5.  Due to disabling symptomatology, she was indicated for  decompression and fusion.  The risks and benefits were discussed,  including bleeding, infection, damage to vital structures, CSF leak,  epidural fibrosis, adjacent segments needing fusion in the future,  anesthetic complications, etc.   TECHNIQUE:  The patient was placed in supine position.  After the  induction of adequate general anesthesia and 1 g of Kefzol, she was  placed prone on the [place] frame on the [place] table.  All bony  prominences were well padded.  Neural monitoring was engaged on the  upper and lower extremities.  The lumbar region was prepped and draped  in the usual sterile fashion.  An incision  was made from the spinous  process of 4 to S1.  The subcutaneous tissue was dissected.  Electrocautery was utilized to achieve hemostasis.  The dorsolumbar  fascia was identified and divided in line with the skin incision.  The  paraspinous muscle was elevated from the laminae of 4, 5 and S1.  We  obtained a confirmatory radiograph with a Kocher on the spinous process  of 5.  We skeletonized the facets at L5-S1.  We performed a medial  decompression of L5-S1 on the right with a 2 and a 3-mm Kerrison.  We  used an osteotome and removed the inferior process of 5 and the superior  articulating process of S1, exposing the foramen.  We further  decompressed this with a 2 and 3-mm Kerrison, decompressing the pressure  on the L5 nerve root.  We mobilized fibrotic tissue.  We identified the  5 and the S1 pedicles.  We performed an S1 foraminotomy.  We felt that  the 5 and S1 nerve roots were adequately decompressed.  We then exposed  the 5-1 disk.  Bipolar electrocautery was utilized to achieved  hemostasis.  We used an osteotome  down to the disk space, removing a  posterior osteophyte off of 5.  We then entered the disk space with a  pituitary and an 8-mm distractor.  This was then distracted to 9 mm,  which was felt to be optimal.  We used the C-arm for this evaluation.  We curetted the disk space utilizing up and downward curets, and we  fully removed the endplates and any disk material, and we copiously  irrigated this.  We used autologous and G[place]on bone graft chips and the  bone graft with BMP.  This was placed into the disk space in front of  the TLIF device.  The bone was placed in the TLIF.  This was impacted  into the space utilizing a lamina spreader to help distract the disk  space.  We placed it across the midline satisfactorily in the A/P and  lateral planes on x-ray.  This opened up the foramina, which we felt was  contributing to her pathology.  The L5 and the S1 nerve roots  were  freed.  The wound was copiously irrigated.  Next, we identified the  pedicles of 5 and 1 bilaterally, coming out over the facets, identifying  the transverse process and the ala of 5 and 1.  I then used a high-speed  bur and an awl to engage the starting point for the 5 pedicle at the  junction of the TP and the lateral aspect of the facet.  At 5  bilaterally, we placed 45 x 6.5-mm screws; and at S1, 7.0 x 40-mm  screws, after the appropriate drilling, tapping, measurement with a ball-  tipped probe.  We were in at the pedicle at all times, converging  appropriately, and we used C-arm x-ray for localization.  Excellent  purchase was obtained with all screws.  We tested each of the screws and  they were over 35 mA bilaterally.  Prior to placing the screws finally,  we decorticated the ala and the transverse process, and we placed  autologous and autograft bone out laterally.  Both screws were connected  with a short rod.  They were compressed bilaterally and torques were  utilized to torque down over the screwheads.  This was after the screw  caps were placed.  Again, the hockey stick probe passed freely out both  foramina.  Prior to that, we had decorticated the left-sided facet,  rongeured it as well, and left that bone graft in situ.  Extra bone  graft was placed out over the rods bilaterally, beneath the muscle.  We  reexamined the nerve roots; they were free.  The wound was copiously  irrigated.  No evidence of active bleeding or CSF leakage.  On A/P and  lateral planes, the pedicle planes and instrumentation were felt to be  satisfactory.  I removed the retractor.  Electrocautery was utilized to  achieve hemostasis.  I placed a Hemovac and brought it out through a  lateral stab wound in the skin.  I then repaired the dorsolumbar fascia  with #1 Vicryl interrupted, figure-of-eight sutures through the interspinous ligament of 4, 5 and at S1.  Then, the subcutaneous tissues  were  reapproximated with 2-0 Vicryl subcuticular.  The skin was  reapproximated with staples.  The wound was dressed sterilely.  She was  placed supine on the hospital bed and extubated without difficulty, and  transported to the recovery room in satisfactory condition.   The patient tolerated the procedure well with no complications.  There  was 250 mL of blood loss.  Dr. Vira Browns was the assistant.      Jene Every, M.D.  Electronically Signed     JB/MEDQ  D:  03/10/2006  T:  03/11/2006  Job:  [id number]

## 2010-06-19 NOTE — H&P (Signed)
NAME:  [name], [name]                           ACCOUNT NO.:  [id number]   MEDICAL RECORD NO.:  [id number]                   PATIENT TYPE:  AMB   LOCATION:  ENDO                                 FACILITY:  [facility]   PHYSICIAN:  Barbette Hair. Arlyce Dice, M.D. [facility]          DATE OF BIRTH:  05-11-1950   DATE OF ADMISSION:  03/28/2003  DATE OF DISCHARGE:                                HISTORY & PHYSICAL   REASON FOR ADMISSION:  Acute abdominal pain following ERCP with  sphincterotomy, probable post-ERCP pancreatitis.   HISTORY OF PRESENT ILLNESS:  [name] is a 60 year old white female with a  long history of gastroesophageal reflux disease treated with proton pump  inhibitors for the past 7 years, and also treated with Reglan 4 times day  for the past 6 months.  She still gets intermittent heartburn and  indigestion despite these medications, and has had intermittent dysphagia.  The patient was sent to Dr. Melvia Heaps for evaluation of a more acute  problem.  He saw her in the office on March 25, 2003.  About a week  before Christmas, the patient had the first episode of acute epigastric pain  associated with nausea and vomiting and diaphoresis.  It subsided after  about 3 hours, and this occurred while she was [place] the beach.  On March 20, 2003, she had another bout of the same symptoms, and went to the  emergency room at [facility], where she had an ultrasound showing her post-  cholecystectomy state.  The gallbladder had been removed in 2002.  It did  not show any other abnormalities.  However, her alkaline phosphatase was  123, her AST was 222, and her ALT was 103, with a total bilirubin normal at  0.5.  She was referred to Dr. Arlyce Dice, and he felt that she had biliary  dyskinesia and set her up for sphincterotomy on March 28, 2003.  This  procedure was performed and took approximately 45 minutes to complete.  He  performed an ERCP with sphincterotomy and balloon sweeps.  On balloon  sweep,  no debris or stones emerged.  In the postoperative area, upon awakening, she  developed acute epigastric pain, similar to her prior attacks described  above.  However, at this time, she is not complaining of nausea, just severe  pain.  During the procedure itself, she had received 175 mg of fentanyl and  10 of Versed.  When the pain recurred, she got another 25 of fentanyl  without relief.  She was given 4 mg of morphine without relief, and finally,  after 50 mg of Demerol the pain finally subsided.  Her gallbladder was  removed in the year 2000, but I am not sure where this was done, because  there is no record of it in the [facility] chart system, and  she has been in such pain that she is not able  to tell us where this was  done.  However, from the office note from Dr. Arlyce Dice, it is related that she  had been told that her gallbladder was dysfunctional, and that she did not  have stones at the time of her gallbladder surgery.   PAST MEDICAL HISTORY:  1. Gastroesophageal reflux disease.  2. Status post hemorrhoidectomy in 2000.  3. Status post hysterectomy in 1985.  4. Para 1 depression, treated in 1999.  5. Chronic headaches.  6. Cervical herniated nucleus pulposus.  7. Status post C4-5 and C5-6 diskectomy.  8. Peripheral neuropathy treated with Neurontin and imipramine by her     primary care doctor, Dr. Chauncy Passy in [place].  9. Status post cholecystectomy in 2002.   MEDICATIONS:  1. Nexium 40 mg daily.  2. Imipramine 50 mg daily.  3. Neurontin 600 mg q.h.s.  4. Reglan 10 mg p.o. q.i.d.  5. Premarin 0.625 mg daily.   ALLERGIES:  No known drug allergies.   REVIEW OF SYSTEMS:  Difficult to obtain, as the patient was in a lot of  pain, and once she got her pain medications, she was quite sleepy, but she  does have a history of stress urinary incontinence and tingling neuropathic-  type pain in her feet.   FAMILY HISTORY:  There is no history  of gallbladder disease, colon cancer,  peptic ulcer disease, or anemia.  Mother has had coronary artery disease,  and an aunt had breast cancer.   SOCIAL HISTORY:  Remotely smoked.  Does not consume alcohol.  She is  divorced and has an attentive boyfriend; however, they do not live together.  She has one adult child.   PHYSICAL EXAMINATION:  GENERAL:  The patient is a distress white female  moaning in pain.  VITAL SIGNS:  Blood pressure 122/77, pulse 100, saturations on room air 95%,  respirations 16.  HEENT:  No scleral icterus.  No conjunctival pallor.  Oropharynx - mucosa is  moist and clear.  Teeth in fair repair.  NECK:  Without adenopathy, thyromegaly, or JVD.  CHEST:  Clear to auscultation and percussion, though poor breath sounds  secondary to poor effort.  COR:  Regular rate and rhythm.  No murmurs, rubs, or gallops audible.  ABDOMEN:  Soft with active bowel sounds.  She is diffusely tender.  No  guarding or rebound associated with this.  No hepatosplenomegaly, no masses.  RECTAL/GU/BREAST EXAMS:  Deferred.  EXTREMITIES:  No clubbing, cyanosis, or edema.  DERMATOLOGIC:  No spider angiomata.  No significant purpura.  No petechiae.  No rashes.  SKIN:  The skin does look as if it has been exposed excessively to sun with  significant tan present for this time of year.  NEUROLOGIC:  The patient was distracted by her pain during the exam, not  answering her questions well, but moving all 4 and able to sit up and lay  down on the bed without assistance.  PSYCHIATRIC:  She is distressed, fretful, and moaning.  However, once she  got the Demerol, she was laying down snoring, and difficult to arouse, but  saturations on room air were well into the 90's.   LABORATORY DATA:  All pending at this time, and include lipase, amylase,  CMET, CBC.  Labs from the office on March 25, 2003 showed her LFT's to have normalized with total bilirubin 0.4, alkaline phosphatase 82, AST of  22,  and ALT of 28, and lipase of 18.   IMPRESSION:  1. Post ERCP  pancreatitis is the most likely etiology of her acute symptoms.  2. History of biliary dyskinesia with elevated liver function tests     associated with colicky-type pain.  She is now status post ERCP and     sphincterotomy with balloon sweep earlier today.  3. History of depression.  4. Peripheral neuropathy.  5. Gastroesophageal reflux disease with significant symptom history and     breakthrough symptoms despite PPI's and Reglan therapy.  At ERCP survey,     there was no acid peptic-related injury noted.   PLAN:  1. The patient is to be admitted for supportive care including a PCA Demerol     pump, as this medication gave her the most relief of her symptoms.  2. The patient is to be kept NPO except ice chips and continuation of     necessary outpatient medications.  3. The patient received Cipro prior to her ERCP, and we will continue this     for the time being.  4. The patient to have acute abdominal series to right upper possible     perforation associated with a sphincterotomy.     Jennye Moccasin, P.A. [facility]                   Robert D. Arlyce Dice, M.D. [facility]    SG/MEDQ  D:  03/28/2003  T:  03/28/2003  Job:  [id number]

## 2010-06-19 NOTE — Discharge Summary (Signed)
NAMEASTRA, GREGG                 ACCOUNT NO.:  [id number]   MEDICAL RECORD NO.:  [id number]          PATIENT TYPE:  INP   LOCATION:  5028                         FACILITY:  [facility]   PHYSICIAN:  Roma Schanz, P.A.DATE OF BIRTH:  04/25/1950   DATE OF ADMISSION:  03/10/2006  DATE OF DISCHARGE:  03/15/2006                               DISCHARGE SUMMARY   ADMISSION DIAGNOSES:  1. Degenerative disk disease.  2. Spondylostenosis L5-S1.  3. Seasonal allergies.  4. Gastroesophageal reflux disease.   DISCHARGE DIAGNOSES:  1. Degenerative disk disease.  2. Spondylostenosis L5-S1.  3. Seasonal allergies.  4. Gastroesophageal reflux disease.  5. Status post decompression L5-S1 transluminal interbody fusion      utilizing pedicle screw instrumentation __________ fusion.  Purpose      pain control.   CONSULTS:  1. PT.  2. OT.  3. Care management.   PROCEDURES:  The patient was taken to the OR on March 10, 2006 to  undergo decompression L5-S1 with transluminal interbody fusion posterior  lateral fusion utilizing pedicle screw instrumentation.   SURGEON:  Dr. Jene Every.   ASSISTANT:  Everlene Balls.   ANESTHESIA:  General.   SPECIMENS:  None.   ESTIMATED BLOOD LOSS:  250 mL.   HISTORY:  Mrs. Salome is a pleasant 60 year old female with a long  standing history of lower extremity pain.  She has undergone  decompression in the past with a short term relief of her symptoms but  had progressive back and lower extremity pain.  Discogram studies  confirmed reported pain at L5-S1.  It was felt at this point due to her  disabling symptoms that she would benefit from a lumbar decompression.  The risks and benefits of this were discussed with the patient.  She  does elect to proceed.   LABORATORY DATA:  Preoperative CBC shows a white cell count of 7.1.  Hemoglobin 12.8.  Hematocrit 36.8.  These were followed throughout the  hospital course.  White cell count remained normal.   Hemoglobin did drop  to lowest of 9.4.  Hematocrit 27.1 but the patient remained  asymptomatic.  At the time of discharge the hemoglobin was 9.5.  Hematocrit 28.1 and coagulation staged on admission were normal.  Routine chemistries were down pre and postoperatively which remained  within normal range.  Urinalysis done preoperatively was negative but  was negative.  Blood type is A+.  Preoperative chest x-ray showed no  acute findings.  Preoperative EKG showed normal sinus rhythm.   HOSPITAL COURSE:  The patient was admitted.  Taken to the OR.  She  underwent the above stated procedure without difficulty.  She was then  transferred to the PACU and then to the Orthopedic Floor for continued  postoperative care.  Postoperatively she was placed on PCA for her pain  relief.  One Hemovac drain was also placed intraoperatively.  Neurovascular vital signs were __________ .  Postoperative day 1 patient  was doing fairly well.  She had been out of bed with chair with minimal  difficulty.  We discontinued her Foley as well as her Hemovac  at this  time.  Diet was slowly advanced to liquid only.  Postoperative day 2,  the patient continued to do well.  She noted some numbness in the right  lower extremity but overall no significant pain.  Vital signs and  laboratory studies remained stable.  PCA was discontinued.  Diet again  was advanced.  PT/OT was initiated.  Patient continued to do fairly  well.  Postoperative day 5 it was felt she was stable to be going home.  She was ambulating without significant trouble.  She had been out of  bed.  Had bowel movement.  Pain was well controlled on p.o. analgesics.  Incision was clean, dry and intact.  Motor and neurovascular function  was intact to the lower extremities.   DISPOSITION:  The patient discharged home with home health, PT, OT.   DIET:  Is as tolerated.  High fiber as recommended.   DISCHARGE MEDICATIONS:  1. Include Prilosec.  2. Premarin.   3. Allegra.  4. Imitrex.  5. Multivitamin.  6. Vicodin 1-2 p.o. q.4-6 p.r.n. pain.  7. Robaxin 500 mg 1 p.o. q.8 p.r.n. spasm.  8. Colace 1 p.o. b.i.d.   WOUND CARE:  Change her dressing daily.  It is okay for her to shower.  She should not bath and get the incision saturated.   ACTIVITY:  She is to walk as tolerated utilizing brace when out of bed.  She is obey __________ back precautions.   CONDITION ON DISCHARGE:  Stable.   FINAL DIAGNOSES:  __________ lumbar decompression lumbar fusion at L5-  S1.      Roma Schanz, P.A.     CS/MEDQ  D:  05/12/2006  T:  05/12/2006  Job:  [id number]

## 2011-12-29 DIAGNOSIS — Z90710 Acquired absence of both cervix and uterus: Secondary | ICD-10-CM | POA: Insufficient documentation

## 2011-12-29 DIAGNOSIS — G8929 Other chronic pain: Secondary | ICD-10-CM | POA: Insufficient documentation

## 2011-12-29 DIAGNOSIS — D649 Anemia, unspecified: Secondary | ICD-10-CM | POA: Insufficient documentation

## 2011-12-29 DIAGNOSIS — B009 Herpesviral infection, unspecified: Secondary | ICD-10-CM | POA: Insufficient documentation

## 2012-02-21 DIAGNOSIS — R002 Palpitations: Secondary | ICD-10-CM | POA: Insufficient documentation

## 2012-05-12 NOTE — Progress Notes (Signed)
Surgery scheduled for 05/31/12.  Preop appointment on 05/22/12 at 1030am. Need orders in EPIC.  Thanks.

## 2012-05-14 ENCOUNTER — Other Ambulatory Visit: Payer: Self-pay | Admitting: Orthopedic Surgery

## 2012-05-15 ENCOUNTER — Encounter (HOSPITAL_COMMUNITY): Payer: Self-pay

## 2012-05-22 ENCOUNTER — Encounter (HOSPITAL_COMMUNITY)
Admission: RE | Admit: 2012-05-22 | Discharge: 2012-05-22 | Disposition: A | Discharge status: Home / Self Care | Payer: Worker's Compensation | Source: Ambulatory Visit | Attending: Specialist | Admitting: Specialist

## 2012-05-22 ENCOUNTER — Ambulatory Visit (HOSPITAL_COMMUNITY)
Admission: RE | Admit: 2012-05-22 | Discharge: 2012-05-22 | Disposition: A | Discharge status: Home / Self Care | Payer: Worker's Compensation | Source: Ambulatory Visit | Attending: Orthopedic Surgery | Admitting: Orthopedic Surgery

## 2012-05-22 ENCOUNTER — Encounter (HOSPITAL_COMMUNITY): Payer: Self-pay

## 2012-05-22 DIAGNOSIS — M48061 Spinal stenosis, lumbar region without neurogenic claudication: Secondary | ICD-10-CM | POA: Diagnosis present

## 2012-05-22 DIAGNOSIS — G96198 Other disorders of meninges, not elsewhere classified: Secondary | ICD-10-CM | POA: Diagnosis not present

## 2012-05-22 DIAGNOSIS — Z981 Arthrodesis status: Secondary | ICD-10-CM | POA: Insufficient documentation

## 2012-05-22 DIAGNOSIS — Z01812 Encounter for preprocedural laboratory examination: Secondary | ICD-10-CM | POA: Diagnosis not present

## 2012-05-22 DIAGNOSIS — K219 Gastro-esophageal reflux disease without esophagitis: Secondary | ICD-10-CM | POA: Diagnosis not present

## 2012-05-22 HISTORY — DX: Other seasonal allergic rhinitis: J30.2

## 2012-05-22 HISTORY — DX: Gastro-esophageal reflux disease without esophagitis: K21.9

## 2012-05-22 HISTORY — DX: Major depressive disorder, single episode, unspecified: F32.9

## 2012-05-22 HISTORY — DX: Other specified postprocedural states: R11.2

## 2012-05-22 HISTORY — DX: Anxiety disorder, unspecified: F41.9

## 2012-05-22 HISTORY — DX: Headache: R51

## 2012-05-22 HISTORY — DX: Other specified postprocedural states: Z98.890

## 2012-05-22 HISTORY — DX: Anemia, unspecified: D64.9

## 2012-05-22 LAB — BASIC METABOLIC PANEL
Calcium: 9.8 mg/dL (ref 8.4–10.5)
GFR calc non Af Amer: >90 mL/min (ref >90)
Sodium: 140 mEq/L (ref 135–145)

## 2012-05-22 LAB — SURGICAL PCR SCREEN: Staphylococcus aureus: NEGATIVE

## 2012-05-22 LAB — CBC
Platelets: 269 10*3/uL (ref 150–400)
RBC: 3.91 MIL/uL (ref 3.87–5.11)
WBC: 5.4 10*3/uL (ref 4.0–10.5)

## 2012-05-22 NOTE — Patient Instructions (Signed)
[address] ARAIYAH CUMPTON  05/22/2012   Your procedure is scheduled on: 05/31/12  Report to [facility] at 0730 AM.  Call this number if you have problems the morning of surgery [phone]: [phone]   Remember:   Do not eat food or drink liquids After Midnight.     Take these medicines the morning of surgery with A SIP OF WATER: allegra, gabapentin, norco if needed   Do not wear jewelry, make-up or nail polish.  Do not wear lotions, powders, or perfumes. You may wear deodorant.  Do not shave 48 hours prior to surgery. Men may shave face and neck.  Do not bring valuables to the hospital.  Contacts, dentures or bridgework may not be worn into surgery.  Leave suitcase in the car. After surgery it may be brought to your room.  For patients admitted to the hospital, checkout time is 11:00 AM the day of discharge.    Please read over the following fact sheets that you were given: MRSA Information, incentive spirometry fact sheet Birdie Sons, RN  pre op nurse call if needed [phone]    FAILURE TO FOLLOW THESE INSTRUCTIONS MAY RESULT IN CANCELLATION OF YOUR SURGERY   Patient Signature: ___________________________________________

## 2012-05-22 NOTE — Progress Notes (Signed)
EKG 02/21/12 on chart

## 2012-05-30 ENCOUNTER — Other Ambulatory Visit: Payer: Self-pay | Admitting: Orthopedic Surgery

## 2012-05-30 NOTE — H&P (Signed)
[name] is an 61 y.o. female.   Chief Complaint: back and right leg pain HPI: Pt with ongoing R sided back pain and R leg pain x years. She reports pain, aching, throbbing, stiffness, weakness, numbness, burning, urinary incontinence, leg pain, pain with lying and pain with sitting. Refractory to home exercise program, pain medications and ESI. The following medication has been used for pain control: Hydrocodone and lidoderm patch.  After her injection (ESI L4-5), she had complete numbness in the distribution of her leg pain following the epidural at 4-5 on the right. The pain then returned and she has had persistent radiating pain. Her pain radiates into the buttocks, thigh, and into the dorsum of the foot including the great toe. She has minimal back pain.  Past Medical History  Diagnosis Date  . Seasonal allergies   . Anxiety 2000  . Depression 2000  . GERD (gastroesophageal reflux disease)   . Headache   . Anemia   . PONV (postoperative nausea and vomiting)     Past Surgical History  Procedure Laterality Date  . Abdominal hysterectomy  1984  . Cholecystectomy  2003  . Neck surgery  2001,2007    herniated disc  . Ercp w/ sphincterotomy and balloon dilation  2005    pancreatitis   . Back surgery  2006, 2008    lower back, fusion in l5-s1  . Shoulder arthroscopy Bilateral 2010, 2011    No family history on file. Social History:  reports that she quit smoking about 40 years ago. Her smoking use included Cigarettes. She has a 1.5 pack-year smoking history. She has never used smokeless tobacco. She reports that she does not drink alcohol or use illicit drugs.  Allergies: No Active Allergies   (Not in a hospital admission)  No results found for this or any previous visit (from the past 48 hour(s)). No results found.  Review of Systems  Constitutional: Negative.   HENT: Negative.   Eyes: Negative.   Respiratory: Negative.   Cardiovascular: Negative.    Gastrointestinal: Negative.   Genitourinary: Negative.   Musculoskeletal: Positive for back pain.  Skin: Negative.   Neurological: Positive for sensory change and focal weakness.  Endo/Heme/Allergies: Negative.   Psychiatric/Behavioral: Negative.     There were no vitals taken for this visit. Physical Exam  Constitutional: She is oriented to person, place, and time. She appears well-developed and well-nourished. She appears distressed.  HENT:  Head: Normocephalic and atraumatic.  Eyes: Conjunctivae and EOM are normal. Pupils are equal, round, and reactive to light.  Neck: Normal range of motion. Neck supple.  Cardiovascular: Normal rate and regular rhythm.   Respiratory: Effort normal and breath sounds normal.  GI: Soft. Bowel sounds are normal.  Musculoskeletal:  On exam mild distress. She walks with an antalgic gait. Straight leg raise produces buttock, thigh, and calf pain exacerbated with dorsal augmentation maneuver. EHL is 5-/5. She has pain with extension.  Lumbar spine exam reveals no evidence of soft tissue swelling, no evidence of soft tissue swelling or deformity or skin ecchymosis. On palpation there is no tenderness of the lumbar spine. No flank pain with percussion. The abdomen is soft and nontender. Nontender over the trochanters. No cellulitis or lymphadenopathy.  Motor is 5/5 including tibialis anterior, plantarflexion, quadriceps and hamstrings. The patient is normoreflexic. There is no Babinski or clonus. Sensory exam is intact to light touch. The patient has good distal pulses. No DVT. No pain and normal range of motion without instability of   the hips, knees and ankles.  Neurological: She is alert and oriented to person, place, and time. She has normal reflexes.  Skin: Skin is warm and dry.  Psychiatric: She has a normal mood and affect.    MRI shows lateral recess stenosis at 4-5 affecting the 5 root as it courses underneath the 4-5 facet and out the foramen  at 5-1, underneath the facet, which is abutted by the pedicle screw.  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 Neural tension signs, EHL weakness, myotomal weakness, dermatomal dysesthesias.  I had an extensive discussion with the patient regarding pathology, relevant anatomy, and treatment options, either living with the symptoms or considering decompression of the 5 root, which would include the adjacent segment at 4-5 and into 5-1 where she had her previous surgery and foraminotomy, possible hardware removal on the right. Hopefully that can be avoided. We can use microscopic decompression. I do feel that this is related to her initial injury as the 5 root courses out onto the facet at 4-5 and at the foramen at 5-1 where previous surgery was, and I feel that is where the area of compression is and it is common in adjacent segment injury. Certainly in the supine position, she does have this degree of stenosis, which is confirmed by her block, her diagnostic block. MRI does tend to underestimate the degree of stenosis, that is if it is in the unloaded position. Certainly if additional information is required, an upright flexion and extension, lateral bending CT myelogram would be appropriate. However, given the invasive procedure and the diagnostic test, the epidural and her symptomatology, I feel that we can forego that at this point.  I do not feel that an adjacent fusion is necessary given her disc at 4-5.  Dr. Beane previously discussed risks, complications, alternatives including but not limited to DVT, PE, infx, bleeding, failure of procedure, need for secondary procedure, CSF leak, dural tear, nerve injury, anesthesia risk.  Plan decompression L4-5 and L5-S1, possible hardware removal   Owenn Rothermel M. for Dr. Beane 05/30/2012, 9:19 PM    

## 2012-05-31 ENCOUNTER — Encounter (HOSPITAL_COMMUNITY)
Admission: RE | Disposition: A | Discharge status: Home / Self Care | Payer: Self-pay | Source: Ambulatory Visit | Attending: Specialist

## 2012-05-31 ENCOUNTER — Ambulatory Visit (HOSPITAL_COMMUNITY): Payer: Worker's Compensation | Admitting: Anesthesiology

## 2012-05-31 ENCOUNTER — Encounter (HOSPITAL_COMMUNITY): Payer: Self-pay | Admitting: *Deleted

## 2012-05-31 ENCOUNTER — Encounter (HOSPITAL_COMMUNITY): Payer: Self-pay | Admitting: Anesthesiology

## 2012-05-31 ENCOUNTER — Ambulatory Visit (HOSPITAL_COMMUNITY): Payer: Worker's Compensation

## 2012-05-31 ENCOUNTER — Ambulatory Visit (HOSPITAL_COMMUNITY)
Admission: RE | Admit: 2012-05-31 | Discharge: 2012-06-01 | Disposition: A | Discharge status: Home / Self Care | Payer: Worker's Compensation | Source: Ambulatory Visit | Attending: Specialist | Admitting: Specialist

## 2012-05-31 DIAGNOSIS — K219 Gastro-esophageal reflux disease without esophagitis: Secondary | ICD-10-CM | POA: Insufficient documentation

## 2012-05-31 DIAGNOSIS — Z01812 Encounter for preprocedural laboratory examination: Secondary | ICD-10-CM | POA: Insufficient documentation

## 2012-05-31 DIAGNOSIS — Z981 Arthrodesis status: Secondary | ICD-10-CM | POA: Insufficient documentation

## 2012-05-31 DIAGNOSIS — G96198 Other disorders of meninges, not elsewhere classified: Secondary | ICD-10-CM | POA: Insufficient documentation

## 2012-05-31 DIAGNOSIS — M48061 Spinal stenosis, lumbar region without neurogenic claudication: Secondary | ICD-10-CM | POA: Insufficient documentation

## 2012-05-31 HISTORY — PX: LUMBAR LAMINECTOMY/DECOMPRESSION MICRODISCECTOMY: SHX5026

## 2012-05-31 SURGERY — LUMBAR LAMINECTOMY/DECOMPRESSION MICRODISCECTOMY 2 LEVELS
Anesthesia: General | Site: Spine Lumbar | Wound class: Clean

## 2012-05-31 MED ORDER — VITAMIN C 500 MG PO TABS
500.0000 mg | ORAL_TABLET | Freq: Every day | ORAL | Status: DC
  Administered 2012-05-31 – 2012-06-01 (×2): 500 mg via ORAL
  Filled 2012-05-31 (×2): qty 1

## 2012-05-31 MED ORDER — SODIUM CHLORIDE 0.9 % IV SOLN: 250.0000 mL | INTRAVENOUS | Status: DC

## 2012-05-31 MED ORDER — THROMBIN 5000 UNITS EX SOLR
CUTANEOUS | Status: AC
  Filled 2012-05-31: qty 10000

## 2012-05-31 MED ORDER — CEFAZOLIN SODIUM-DEXTROSE 2-3 GM-% IV SOLR
2.0000 g | INTRAVENOUS | Status: AC
  Administered 2012-05-31: 2 g via INTRAVENOUS

## 2012-05-31 MED ORDER — FENTANYL CITRATE 0.05 MG/ML IJ SOLN
INTRAMUSCULAR | Status: DC | PRN
  Administered 2012-05-31: 50 ug via INTRAVENOUS
  Administered 2012-05-31: 100 ug via INTRAVENOUS
  Administered 2012-05-31: 50 ug via INTRAVENOUS

## 2012-05-31 MED ORDER — BUPIVACAINE HCL (PF) 0.5 % IJ SOLN
INTRAMUSCULAR | Status: AC
  Filled 2012-05-31: qty 30

## 2012-05-31 MED ORDER — HYDROMORPHONE HCL PF 1 MG/ML IJ SOLN: 0.2500 mg | INTRAMUSCULAR | Status: DC | PRN

## 2012-05-31 MED ORDER — ONDANSETRON HCL 4 MG/2ML IJ SOLN
INTRAMUSCULAR | Status: DC | PRN
  Administered 2012-05-31: 4 mg via INTRAVENOUS

## 2012-05-31 MED ORDER — CLINDAMYCIN PHOSPHATE 600 MG/50ML IV SOLN
600.0000 mg | Freq: Once | INTRAVENOUS | Status: AC
  Administered 2012-05-31: 600 mg via INTRAVENOUS
  Filled 2012-05-31: qty 50

## 2012-05-31 MED ORDER — PROMETHAZINE HCL 25 MG/ML IJ SOLN: 6.2500 mg | INTRAMUSCULAR | Status: DC | PRN

## 2012-05-31 MED ORDER — METHOCARBAMOL 500 MG PO TABS
500.0000 mg | ORAL_TABLET | Freq: Four times a day (QID) | ORAL | Status: DC | PRN
  Administered 2012-05-31 – 2012-06-01 (×4): 500 mg via ORAL
  Filled 2012-05-31 (×5): qty 1

## 2012-05-31 MED ORDER — OXYBUTYNIN CHLORIDE ER 15 MG PO TB24
15.0000 mg | ORAL_TABLET | Freq: Every day | ORAL | Status: DC
  Administered 2012-05-31 – 2012-06-01 (×2): 15 mg via ORAL
  Filled 2012-05-31 (×2): qty 1

## 2012-05-31 MED ORDER — METHOCARBAMOL 100 MG/ML IJ SOLN: 500.0000 mg | Freq: Four times a day (QID) | INTRAVENOUS | Status: DC | PRN

## 2012-05-31 MED ORDER — OXYCODONE-ACETAMINOPHEN 7.5-325 MG PO TABS: 1.0000 | ORAL_TABLET | ORAL | Status: DC | PRN

## 2012-05-31 MED ORDER — DEXAMETHASONE SODIUM PHOSPHATE 4 MG/ML IJ SOLN
INTRAMUSCULAR | Status: DC | PRN
  Administered 2012-05-31: 8 mg via INTRAVENOUS

## 2012-05-31 MED ORDER — GABAPENTIN 300 MG PO CAPS
300.0000 mg | ORAL_CAPSULE | Freq: Three times a day (TID) | ORAL | Status: DC
  Administered 2012-05-31 – 2012-06-01 (×3): 300 mg via ORAL
  Filled 2012-05-31 (×5): qty 1

## 2012-05-31 MED ORDER — LORATADINE 10 MG PO TABS
10.0000 mg | ORAL_TABLET | Freq: Every day | ORAL | Status: DC
  Administered 2012-06-01: 10 mg via ORAL
  Filled 2012-05-31: qty 1

## 2012-05-31 MED ORDER — METHOCARBAMOL 500 MG PO TABS: 500.0000 mg | ORAL_TABLET | Freq: Three times a day (TID) | ORAL | Status: DC

## 2012-05-31 MED ORDER — HYDROCODONE-ACETAMINOPHEN 5-325 MG PO TABS
1.0000 | ORAL_TABLET | ORAL | Status: DC | PRN
  Administered 2012-05-31 – 2012-06-01 (×7): 2 via ORAL
  Filled 2012-05-31 (×7): qty 2

## 2012-05-31 MED ORDER — OXYCODONE-ACETAMINOPHEN 5-325 MG PO TABS: 1.0000 | ORAL_TABLET | ORAL | Status: DC | PRN

## 2012-05-31 MED ORDER — HYDROMORPHONE HCL PF 1 MG/ML IJ SOLN: 0.5000 mg | INTRAMUSCULAR | Status: DC | PRN

## 2012-05-31 MED ORDER — THROMBIN 5000 UNITS EX SOLR
CUTANEOUS | Status: AC
  Filled 2012-05-31: qty 5000

## 2012-05-31 MED ORDER — KCL IN DEXTROSE-NACL 20-5-0.45 MEQ/L-%-% IV SOLN
INTRAVENOUS | Status: DC
  Administered 2012-05-31: 16:00:00 via INTRAVENOUS
  Filled 2012-05-31 (×2): qty 1000

## 2012-05-31 MED ORDER — MIDAZOLAM HCL 5 MG/5ML IJ SOLN
INTRAMUSCULAR | Status: DC | PRN
  Administered 2012-05-31: 2 mg via INTRAVENOUS

## 2012-05-31 MED ORDER — PHENYLEPHRINE HCL 10 MG/ML IJ SOLN
INTRAMUSCULAR | Status: DC | PRN
  Administered 2012-05-31: 60 ug via INTRAVENOUS

## 2012-05-31 MED ORDER — ACETAMINOPHEN 10 MG/ML IV SOLN
INTRAVENOUS | Status: DC | PRN
  Administered 2012-05-31: 1000 mg via INTRAVENOUS

## 2012-05-31 MED ORDER — EPHEDRINE SULFATE 50 MG/ML IJ SOLN
INTRAMUSCULAR | Status: DC | PRN
  Administered 2012-05-31 (×3): 5 mg via INTRAVENOUS

## 2012-05-31 MED ORDER — PHENOL 1.4 % MT LIQD: 1.0000 | OROMUCOSAL | Status: DC | PRN

## 2012-05-31 MED ORDER — SUCCINYLCHOLINE CHLORIDE 20 MG/ML IJ SOLN
INTRAMUSCULAR | Status: DC | PRN
  Administered 2012-05-31: 100 mg via INTRAVENOUS

## 2012-05-31 MED ORDER — HEMOSTATIC AGENTS (NO CHARGE) OPTIME
TOPICAL | Status: DC | PRN
  Administered 2012-05-31: 1 via TOPICAL

## 2012-05-31 MED ORDER — ACETAMINOPHEN 650 MG RE SUPP: 650.0000 mg | RECTAL | Status: DC | PRN

## 2012-05-31 MED ORDER — SODIUM CHLORIDE 0.9 % IJ SOLN: 3.0000 mL | INTRAMUSCULAR | Status: DC | PRN

## 2012-05-31 MED ORDER — GLYCOPYRROLATE 0.2 MG/ML IJ SOLN
INTRAMUSCULAR | Status: DC | PRN
  Administered 2012-05-31: 0.6 mg via INTRAVENOUS

## 2012-05-31 MED ORDER — ACETAMINOPHEN 10 MG/ML IV SOLN
INTRAVENOUS | Status: AC
  Filled 2012-05-31: qty 100

## 2012-05-31 MED ORDER — ACETAMINOPHEN 325 MG PO TABS: 650.0000 mg | ORAL_TABLET | ORAL | Status: DC | PRN

## 2012-05-31 MED ORDER — CEFAZOLIN SODIUM-DEXTROSE 2-3 GM-% IV SOLR
INTRAVENOUS | Status: AC
  Filled 2012-05-31: qty 50

## 2012-05-31 MED ORDER — LACTATED RINGERS IV SOLN
INTRAVENOUS | Status: DC
  Administered 2012-05-31 (×2): via INTRAVENOUS

## 2012-05-31 MED ORDER — NEOSTIGMINE METHYLSULFATE 1 MG/ML IJ SOLN
INTRAMUSCULAR | Status: DC | PRN
  Administered 2012-05-31: 3 mg via INTRAVENOUS

## 2012-05-31 MED ORDER — ROCURONIUM BROMIDE 100 MG/10ML IV SOLN
INTRAVENOUS | Status: DC | PRN
  Administered 2012-05-31: 20 mg via INTRAVENOUS
  Administered 2012-05-31 (×2): 10 mg via INTRAVENOUS

## 2012-05-31 MED ORDER — METOCLOPRAMIDE HCL 5 MG/ML IJ SOLN
INTRAMUSCULAR | Status: DC | PRN
  Administered 2012-05-31: 10 mg via INTRAVENOUS

## 2012-05-31 MED ORDER — CEFAZOLIN SODIUM-DEXTROSE 2-3 GM-% IV SOLR
2.0000 g | Freq: Three times a day (TID) | INTRAVENOUS | Status: AC
  Administered 2012-05-31 – 2012-06-01 (×2): 2 g via INTRAVENOUS
  Filled 2012-05-31 (×2): qty 50

## 2012-05-31 MED ORDER — BUPIVACAINE-EPINEPHRINE (PF) 0.5% -1:200000 IJ SOLN
INTRAMUSCULAR | Status: AC
  Filled 2012-05-31: qty 10

## 2012-05-31 MED ORDER — BUPIVACAINE-EPINEPHRINE 0.5% -1:200000 IJ SOLN
INTRAMUSCULAR | Status: DC | PRN
  Administered 2012-05-31: 5 mL

## 2012-05-31 MED ORDER — THROMBIN 5000 UNITS EX SOLR
CUTANEOUS | Status: DC | PRN
  Administered 2012-05-31: 10000 [IU] via TOPICAL

## 2012-05-31 MED ORDER — SODIUM CHLORIDE 0.9 % IJ SOLN: 3.0000 mL | Freq: Two times a day (BID) | INTRAMUSCULAR | Status: DC

## 2012-05-31 MED ORDER — BUPIVACAINE HCL (PF) 0.25 % IJ SOLN
INTRAMUSCULAR | Status: AC
  Filled 2012-05-31: qty 30

## 2012-05-31 MED ORDER — DOCUSATE SODIUM 100 MG PO CAPS
100.0000 mg | ORAL_CAPSULE | Freq: Two times a day (BID) | ORAL | Status: DC
  Administered 2012-05-31 – 2012-06-01 (×2): 100 mg via ORAL

## 2012-05-31 MED ORDER — SODIUM CHLORIDE 0.9 % IR SOLN
Status: DC | PRN
  Administered 2012-05-31: 11:00:00

## 2012-05-31 MED ORDER — SCOPOLAMINE 1 MG/3DAYS TD PT72
MEDICATED_PATCH | TRANSDERMAL | Status: DC | PRN
  Administered 2012-05-31: 1 via TRANSDERMAL

## 2012-05-31 MED ORDER — ONDANSETRON HCL 4 MG/2ML IJ SOLN: 4.0000 mg | INTRAMUSCULAR | Status: DC | PRN

## 2012-05-31 MED ORDER — PROPOFOL 10 MG/ML IV BOLUS
INTRAVENOUS | Status: DC | PRN
  Administered 2012-05-31: 150 mg via INTRAVENOUS

## 2012-05-31 MED ORDER — KETOROLAC TROMETHAMINE 30 MG/ML IJ SOLN: 15.0000 mg | Freq: Once | INTRAMUSCULAR | Status: DC | PRN

## 2012-05-31 MED ORDER — MENTHOL 3 MG MT LOZG: 1.0000 | LOZENGE | OROMUCOSAL | Status: DC | PRN

## 2012-05-31 MED ORDER — SCOPOLAMINE 1 MG/3DAYS TD PT72
MEDICATED_PATCH | TRANSDERMAL | Status: AC
  Filled 2012-05-31: qty 1

## 2012-05-31 SURGICAL SUPPLY — 52 items
APL SKNCLS STERI-STRIP NONHPOA (GAUZE/BANDAGES/DRESSINGS) ×2
BAG SPEC THK2 15X12 ZIP CLS (MISCELLANEOUS) ×1
BAG ZIPLOCK 12X15 (MISCELLANEOUS) ×2 IMPLANT
BENZOIN TINCTURE PRP APPL 2/3 (GAUZE/BANDAGES/DRESSINGS) ×4 IMPLANT
CHLORAPREP W/TINT 26ML (MISCELLANEOUS) IMPLANT
CLEANER TIP ELECTROSURG 2X2 (MISCELLANEOUS) ×2 IMPLANT
CLOTH 2% CHLOROHEXIDINE 3PK (PERSONAL CARE ITEMS) ×1 IMPLANT
CLOTH BEACON ORANGE TIMEOUT ST (SAFETY) ×2 IMPLANT
DECANTER SPIKE VIAL GLASS SM (MISCELLANEOUS) ×2 IMPLANT
DRAPE MICROSCOPE LEICA (MISCELLANEOUS) ×2 IMPLANT
DRAPE POUCH INSTRU U-SHP 10X18 (DRAPES) ×2 IMPLANT
DRAPE SURG 17X11 SM STRL (DRAPES) ×2 IMPLANT
DRSG AQUACEL AG ADV 3.5X 6 (GAUZE/BANDAGES/DRESSINGS) ×1 IMPLANT
DRSG EMULSION OIL 3X3 NADH (GAUZE/BANDAGES/DRESSINGS) IMPLANT
DRSG PAD ABDOMINAL 8X10 ST (GAUZE/BANDAGES/DRESSINGS) IMPLANT
DRSG TELFA 4X5 ISLAND ADH (GAUZE/BANDAGES/DRESSINGS) IMPLANT
DURAPREP 26ML APPLICATOR (WOUND CARE) ×2 IMPLANT
ELECT REM PT RETURN 9FT ADLT (ELECTROSURGICAL) ×2
ELECTRODE REM PT RTRN 9FT ADLT (ELECTROSURGICAL) ×1 IMPLANT
GLOVE BIOGEL PI IND STRL 7.5 (GLOVE) ×1 IMPLANT
GLOVE BIOGEL PI IND STRL 8 (GLOVE) ×1 IMPLANT
GLOVE BIOGEL PI INDICATOR 7.5 (GLOVE) ×1
GLOVE BIOGEL PI INDICATOR 8 (GLOVE) ×1
GLOVE SURG SS PI 7.5 STRL IVOR (GLOVE) ×2 IMPLANT
GLOVE SURG SS PI 8.0 STRL IVOR (GLOVE) ×4 IMPLANT
GOWN PREVENTION PLUS LG XLONG (DISPOSABLE) ×2 IMPLANT
GOWN STRL REIN XL XLG (GOWN DISPOSABLE) ×4 IMPLANT
KIT BASIN OR (CUSTOM PROCEDURE TRAY) ×2 IMPLANT
KIT POSITIONING SURG ANDREWS (MISCELLANEOUS) ×2 IMPLANT
MANIFOLD NEPTUNE II (INSTRUMENTS) ×2 IMPLANT
NDL SPNL 18GX3.5 QUINCKE PK (NEEDLE) ×3 IMPLANT
NEEDLE SPNL 18GX3.5 QUINCKE PK (NEEDLE) ×6 IMPLANT
PATTIES SURGICAL .5 X.5 (GAUZE/BANDAGES/DRESSINGS) IMPLANT
PATTIES SURGICAL .75X.75 (GAUZE/BANDAGES/DRESSINGS) IMPLANT
PATTIES SURGICAL 1X1 (DISPOSABLE) IMPLANT
SPONGE SURGIFOAM ABS GEL 100 (HEMOSTASIS) ×2 IMPLANT
STAPLER VISISTAT (STAPLE) IMPLANT
STRIP CLOSURE SKIN 1/2X4 (GAUZE/BANDAGES/DRESSINGS) ×1 IMPLANT
SUT PROLENE 3 0 PS 2 (SUTURE) ×1 IMPLANT
SUT VIC AB 0 CT1 27 (SUTURE)
SUT VIC AB 0 CT1 27XBRD ANTBC (SUTURE) IMPLANT
SUT VIC AB 1 CT1 27 (SUTURE) ×2
SUT VIC AB 1 CT1 27XBRD ANTBC (SUTURE) ×1 IMPLANT
SUT VIC AB 1-0 CT2 27 (SUTURE) ×1 IMPLANT
SUT VIC AB 2-0 CT1 27 (SUTURE) ×2
SUT VIC AB 2-0 CT1 TAPERPNT 27 (SUTURE) ×1 IMPLANT
SUT VIC AB 2-0 CT2 27 (SUTURE) ×5 IMPLANT
SUT VICRYL 0 27 CT2 27 ABS (SUTURE) ×4 IMPLANT
SUT VICRYL 0 UR6 27IN ABS (SUTURE) IMPLANT
SYRINGE 10CC LL (SYRINGE) ×4 IMPLANT
TRAY LAMINECTOMY (CUSTOM PROCEDURE TRAY) ×2 IMPLANT
YANKAUER SUCT BULB TIP NO VENT (SUCTIONS) ×2 IMPLANT

## 2012-05-31 NOTE — Brief Op Note (Signed)
05/31/2012  11:56 AM  PATIENT:  [name]  62 y.o. female  PRE-OPERATIVE DIAGNOSIS:  Stenosis L4 - L5  POST-OPERATIVE DIAGNOSIS:  Stenosis L4 - L5  PROCEDURE:  Procedure(s): MICRO LUMBAR DECOMPRESSION L4 - L5, L5 - S1  (N/A)  SURGEON:  Surgeon(s) and Role:    * Javier Docker, MD - Primary  PHYSICIAN ASSISTANT:   ASSISTANTS: Dawayne Cirri ANESTHESIA:   general  EBL:  Total I/O In: 1000 [I.V.:1000] Out: 70 [Urine:70]  BLOOD ADMINISTERED:none  DRAINS: none   LOCAL MEDICATIONS USED:  MARCAINE     SPECIMEN:  No Specimen  DISPOSITION OF SPECIMEN:  N/A  COUNTS:  YES  TOURNIQUET:  * No tourniquets in log *  DICTATION: .Other Dictation: Dictation Number    [id number]  PLAN OF CARE: Admit for overnight observation  PATIENT DISPOSITION:  PACU - hemodynamically stable.   Delay start of Pharmacological VTE agent (>24hrs) due to surgical blood loss or risk of bleeding: yes

## 2012-05-31 NOTE — Transfer of Care (Signed)
Immediate Anesthesia Transfer of Care Note  Patient: [name]  Procedure(s) Performed: Procedure(s): MICRO LUMBAR DECOMPRESSION L4 - L5, L5 - S1  (N/A)  Patient Location: PACU  Anesthesia Type:General  Level of Consciousness: awake, patient cooperative and responds to stimulation  Airway & Oxygen Therapy: Patient Spontanous Breathing and Patient connected to face mask oxygen  Post-op Assessment: Report given to PACU RN, Post -op Vital signs reviewed and stable and Patient moving all extremities X 4  Post vital signs: Reviewed and stable  Complications: No apparent anesthesia complications

## 2012-05-31 NOTE — Preoperative (Signed)
Beta Blockers   Reason not to administer Beta Blockers:Not Applicable, not on home BB 

## 2012-05-31 NOTE — Anesthesia Preprocedure Evaluation (Signed)

## 2012-05-31 NOTE — H&P (View-Only) (Signed)
[name] is an 62 y.o. female.   Chief Complaint: back and right leg pain HPI: Pt with ongoing R sided back pain and R leg pain x years. She reports pain, aching, throbbing, stiffness, weakness, numbness, burning, urinary incontinence, leg pain, pain with lying and pain with sitting. Refractory to home exercise program, pain medications and ESI. The following medication has been used for pain control: Hydrocodone and lidoderm patch.  After her injection (ESI L4-5), she had complete numbness in the distribution of her leg pain following the epidural at 4-5 on the right. The pain then returned and she has had persistent radiating pain. Her pain radiates into the buttocks, thigh, and into the dorsum of the foot including the great toe. She has minimal back pain.  Past Medical History  Diagnosis Date  . Seasonal allergies   . Anxiety 2000  . Depression 2000  . GERD (gastroesophageal reflux disease)   . Headache   . Anemia   . PONV (postoperative nausea and vomiting)     Past Surgical History  Procedure Laterality Date  . Abdominal hysterectomy  1984  . Cholecystectomy  2003  . Neck surgery  2001,2007    herniated disc  . Ercp w/ sphincterotomy and balloon dilation  2005    pancreatitis   . Back surgery  2006, 2008    lower back, fusion in l5-s1  . Shoulder arthroscopy Bilateral 2010, 2011    No family history on file. Social History:  reports that she quit smoking about 40 years ago. Her smoking use included Cigarettes. She has a 1.5 pack-year smoking history. She has never used smokeless tobacco. She reports that she does not drink alcohol or use illicit drugs.  Allergies: No Active Allergies   (Not in a hospital admission)  No results found for this or any previous visit (from the past 48 hour(s)). No results found.  Review of Systems  Constitutional: Negative.   HENT: Negative.   Eyes: Negative.   Respiratory: Negative.   Cardiovascular: Negative.    Gastrointestinal: Negative.   Genitourinary: Negative.   Musculoskeletal: Positive for back pain.  Skin: Negative.   Neurological: Positive for sensory change and focal weakness.  Endo/Heme/Allergies: Negative.   Psychiatric/Behavioral: Negative.     There were no vitals taken for this visit. Physical Exam  Constitutional: She is oriented to person, place, and time. She appears well-developed and well-nourished. She appears distressed.  HENT:  Head: Normocephalic and atraumatic.  Eyes: Conjunctivae and EOM are normal. Pupils are equal, round, and reactive to light.  Neck: Normal range of motion. Neck supple.  Cardiovascular: Normal rate and regular rhythm.   Respiratory: Effort normal and breath sounds normal.  GI: Soft. Bowel sounds are normal.  Musculoskeletal:  On exam mild distress. She walks with an antalgic gait. Straight leg raise produces buttock, thigh, and calf pain exacerbated with dorsal augmentation maneuver. EHL is 5-/5. She has pain with extension.  Lumbar spine exam reveals no evidence of soft tissue swelling, no evidence of soft tissue swelling or deformity or skin ecchymosis. On palpation there is no tenderness of the lumbar spine. No flank pain with percussion. The abdomen is soft and nontender. Nontender over the trochanters. No cellulitis or lymphadenopathy.  Motor is 5/5 including tibialis anterior, plantarflexion, quadriceps and hamstrings. The patient is normoreflexic. There is no Babinski or clonus. Sensory exam is intact to light touch. The patient has good distal pulses. No DVT. No pain and normal range of motion without instability of  the hips, knees and ankles.  Neurological: She is alert and oriented to person, place, and time. She has normal reflexes.  Skin: Skin is warm and dry.  Psychiatric: She has a normal mood and affect.    MRI shows lateral recess stenosis at 4-5 affecting the 5 root as it courses underneath the 4-5 facet and out the foramen  at 5-1, underneath the facet, which is abutted by the pedicle screw.  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 Neural tension signs, EHL weakness, myotomal weakness, dermatomal dysesthesias.  I had an extensive discussion with the patient regarding pathology, relevant anatomy, and treatment options, either living with the symptoms or considering decompression of the 5 root, which would include the adjacent segment at 4-5 and into 5-1 where she had her previous surgery and foraminotomy, possible hardware removal on the right. Hopefully that can be avoided. We can use microscopic decompression. I do feel that this is related to her initial injury as the 5 root courses out onto the facet at 4-5 and at the foramen at 5-1 where previous surgery was, and I feel that is where the area of compression is and it is common in adjacent segment injury. Certainly in the supine position, she does have this degree of stenosis, which is confirmed by her block, her diagnostic block. MRI does tend to underestimate the degree of stenosis, that is if it is in the unloaded position. Certainly if additional information is required, an upright flexion and extension, lateral bending CT myelogram would be appropriate. However, given the invasive procedure and the diagnostic test, the epidural and her symptomatology, I feel that we can forego that at this point.  I do not feel that an adjacent fusion is necessary given her disc at 4-5.  Dr. Shelle Iron previously discussed risks, complications, alternatives including but not limited to DVT, PE, infx, bleeding, failure of procedure, need for secondary procedure, CSF leak, dural tear, nerve injury, anesthesia risk.  Plan decompression L4-5 and L5-S1, possible hardware removal   [name], [name] M. for Dr. Shelle Iron 05/30/2012, 9:19 PM

## 2012-05-31 NOTE — Progress Notes (Signed)
Pt has a case Production designer, theatre/television/film from worker's comp handling DME and any home health d/c needs. Domenic Moras at [phone] can be contacted with any of these needs or questions.

## 2012-05-31 NOTE — Interval H&P Note (Signed)
History and Physical Interval Note:  05/31/2012 7:27 AM  [name]  has presented today for surgery, with the diagnosis of Stenosis L4 - L5  The various methods of treatment have been discussed with the patient and family. After consideration of risks, benefits and other options for treatment, the patient has consented to  Procedure(s): MICRO LUMBAR DECOMPRESSION L4 - L5, L5 - S1 POSSIBLE HARDWARE REMOVAL 2 LEVELS (N/A) as a surgical intervention .  The patient's history has been reviewed, patient examined, no change in status, stable for surgery.  I have reviewed the patient's chart and labs.  Questions were answered to the patient's satisfaction.     Clarice Zulauf C

## 2012-06-01 ENCOUNTER — Encounter (HOSPITAL_COMMUNITY): Payer: Self-pay | Admitting: Specialist

## 2012-06-01 DIAGNOSIS — M48061 Spinal stenosis, lumbar region without neurogenic claudication: Secondary | ICD-10-CM | POA: Diagnosis not present

## 2012-06-01 NOTE — Care Management Note (Signed)
    Page 1 of 2   06/01/2012     3:30:03 PM   CARE MANAGEMENT NOTE 06/01/2012  Patient:  NEVEA, SPIEWAK   Account Number:  [id number]  Date Initiated:  06/01/2012  Documentation initiated by:  Colleen Can  Subjective/Objective Assessment:   dx stenosis L4-L5; redo lumbar decompression  Worker's comp claim thru [place]  'Worker's comp case manager;  Twanna Hy [phone] fx [phone]     Action/Plan:   CM spoke with patient. plans are for her to return to her home in [place] [facility] where she will have caregiver. She will need RW, 3n1, shower/tub seat, reacher, sock aid, long shoe horn, long bath sponge.   Anticipated DC Date:  06/01/2012   Anticipated DC Plan:  HOME/SELF CARE      DC Planning Services  CM consult      PAC Choice  DURABLE MEDICAL EQUIPMENT   Choice offered to / List presented to:  C-1 Patient   DME arranged  3-N-1  WALKER - ROLLING  TUB BENCH  OTHER - SEE COMMENT      DME agency  [facility]        Status of service:  Completed, signed off Medicare Important Message given?   (If response is "NO", the following Medicare IM given date fields will be blank) Date Medicare IM given:   Date Additional Medicare IM given:    Discharge Disposition:  HOME/SELF CARE  Per UR Regulation:  Reviewed for med. necessity/level of care/duration of stay  If discussed at Long Length of Stay Meetings, dates discussed:    Comments:  06/01/2012 [facility] Reshawn Ostlund BSN RN CCM [phone] ORDERS FOR DME AND ADAPTIVE EQUIPENT FAXED AS REQUESTED. 1300 RECEIVED CALL FROM WORKER'S COMP CM WHO advised that she has gotten approval from adjuster ans would be calling back regarding who woud be delivering DME. 1315 received call from Daniell at 1 call managemnt who requested information contact number for [facility]. She will call back with results. 1400 DME was delivered to patient's room via [facility].  06/01/2012 Colleen Can  BSN RN CCM [phone] Tct Worker's comp case manager-Teresa Hartsell-notified of equipmenyt needs. CM is requesteing that orders be faxed to [phone]. She will handle dMe needs. CM will follow.

## 2012-06-01 NOTE — Evaluation (Signed)
Occupational Therapy Evaluation Patient Details Name: [name] MRN: [id number] DOB: April 04, 1950 Today's Date: 06/01/2012 Time: 1660-6301 OT Time Calculation (min): 33 min  OT Assessment / Plan / Recommendation Clinical Impression  Pt is recovering from lumbar decompression, foraminectomies, and lysis of adhesions.  Educated in back precautions.  Pt is performing at a supervision level in mobility and ADL.  Will have sister available for a few days upon d/c.      OT Assessment  Patient does not need any further OT services    Follow Up Recommendations  No OT follow up;Supervision - Intermittent    Barriers to Discharge      Equipment Recommendations  3 in 1 bedside comode;Tub/shower seat (reacher, sock aide, long shoe horn, long bath sponge, RW)    Recommendations for Other Services    Frequency       Precautions / Restrictions Precautions Precautions: Back Precaution Booklet Issued: Yes (comment) Precaution Comments: Pt recalled bending, twisting, and lifting precautions, cues for arching. Restrictions Weight Bearing Restrictions: No   Pertinent Vitals/Pain Back, did not rate, premedicated, repositioned    ADL  Eating/Feeding: Independent Where Assessed - Eating/Feeding: Chair Grooming: Wash/dry hands;Modified independent Where Assessed - Grooming: Unsupported standing Upper Body Bathing: Set up Where Assessed - Upper Body Bathing: Unsupported sitting Lower Body Bathing: Minimal assistance Where Assessed - Lower Body Bathing: Unsupported sitting;Supported sit to stand Upper Body Dressing: Set up Where Assessed - Upper Body Dressing: Unsupported sitting;Supported sit to stand Lower Body Dressing: Minimal assistance Where Assessed - Lower Body Dressing: Unsupported sitting;Supported sit to stand Toilet Transfer: Modified independent Toilet Transfer Method: Sit to stand Toilet Transfer Equipment: Raised toilet seat with arms (or 3-in-1 over toilet) Toileting -  Clothing Manipulation and Hygiene: Supervision/safety (verbal cues to avoid twist) Where Assessed - Toileting Clothing Manipulation and Hygiene: Sit on 3-in-1 or toilet Equipment Used: Rolling walker;Reacher;Long-handled sponge;Long-handled shoe horn;Sock aid;Gait belt Transfers/Ambulation Related to ADLs: supervision with RW, verbal cues to avoid twisting ADL Comments: Instructed in back precautions related to ADL  and IADL and mobility during ADL. Instructed in use of AE for LB ADL, pt unable to access feet using alternative technique.    OT Diagnosis:    OT Problem List:   OT Treatment Interventions:     OT Goals    Visit Information  Last OT Received On: 06/01/12 Assistance Needed: +1    Subjective Data  Subjective: "I have had multiple back surgeries." Patient Stated Goal: Home with sister's assist for a few days.   Prior Functioning     Home Living Lives With: Alone Available Help at Discharge: Family Type of Home: House Home Access: Stairs to enter Secretary/administrator of Steps: 2 Entrance Stairs-Rails: Right Home Layout: One level Bathroom Shower/Tub: Forensic psychologist:  (unsure if she still has RW) Prior Function Level of Independence: Independent Able to Take Stairs?: Yes Driving: Yes Vocation: Full time employment Communication Communication: No difficulties Dominant Hand: Right         Vision/Perception     Cognition  Cognition Arousal/Alertness: Awake/alert Behavior During Therapy: WFL for tasks assessed/performed Overall Cognitive Status: Within Functional Limits for tasks assessed    Extremity/Trunk Assessment Right Upper Extremity Assessment RUE ROM/Strength/Tone: [facility] for tasks assessed Left Upper Extremity Assessment LUE ROM/Strength/Tone: WFL for tasks assessed Right Lower Extremity Assessment RLE ROM/Strength/Tone: [facility] for tasks assessed Left Lower Extremity Assessment LLE  ROM/Strength/Tone: [facility] for tasks assessed Trunk Assessment Trunk Assessment: Normal  Mobility Bed Mobility Bed Mobility: Not assessed Transfers Transfers: Sit to Stand;Stand to Sit Sit to Stand: With upper extremity assist;From chair/3-in-1;5: Supervision Stand to Sit: With upper extremity assist;To chair/3-in-1;5: Supervision Details for Transfer Assistance: min cues for use of UEs to self assist     Exercise     Balance     End of Session OT - End of Session Activity Tolerance: Patient limited by pain Patient left: in chair;with call bell/phone within reach  GO Functional Assessment Tool Used: clinical judgment Functional Limitation: Self care Self Care Current Status (R6045): At least 1 percent but less than 20 percent impaired, limited or restricted Self Care Goal Status (W0981): At least 1 percent but less than 20 percent impaired, limited or restricted Self Care Discharge Status [phone]): At least 1 percent but less than 20 percent impaired, limited or restricted   Evern Bio 06/01/2012, 9:29 AM [phone]

## 2012-06-01 NOTE — Op Note (Signed)
[name], [name]                 ACCOUNT NO.:  [id number]  MEDICAL RECORD NO.:  [id number]  LOCATION:  1606                         FACILITY:  [facility]  PHYSICIAN:  Jene Every, M.D.    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  PROCEDURE PERFORMED:  Redo lumbar decompression at L4-5, left foraminotomies of 4 and 5.  Lysis of adhesions at 4-5 on the right, and evaluation of fusion.  ANESTHESIA:  General.  ASSISTANT:  Lanna Poche, PA.  HISTORY:  This is a 62 year old with a history of lumbar fusion at 5-1 who has persisted right lower extremity radicular pain and myelogram and MRI indicating adjacent segment stenosis at 4-5.  She had a history of pedicle screws placed at 5-1 and solid fusion was noted and temporarily relief from L5 selective nerve root block.  It was felt that adjacent segment ligamentum flavum hypertrophy and lateral recess stenosis noted and he was refractory to conservative treatment, neurotension signs, and EHL weakness.  He had temporary relief from a fibrotic indicated for decompression, possible hardware removal if pedicle screw was noted, the impingement on the 5 root, evaluation of the fusion.  Risk and benefits were discussed with bleeding, infection, damage to neurovascular structure, no change in symptoms, worsening symptoms, need for adjacent segment fusion.  TECHNIQUE:  With the patient in supine position, after induction of adequate general anesthesia, 2 g Kefzol, included due to history of surgery, inpatient hospitalization, and instrumentation, placed prone on the Elkins frame.  All bony  prominences were well padded.  Two 18-gauge spinal needles were utilized, localized with the spinous process of L5- S1.  Subcutaneous tissue was dissected by electrocautery to achieve hemostasis.  Scar tissue was encountered.  We incised the dorsal lumbar fascia in the midline from L4 to below L5.  We elevated paraspinous musculature of 4 and 5.  Scar tissue was encountered at the L4-5 interspace into the lamina of 4.  We confirmed the spinous process 4 and 5 by x-ray.  Operating microscope was draped along the surgical field. Removed a hemilaminotomy with caudad edge of 4 was performed with 2 mm Kerrison, the cephalad edge of remaining lamina of 5 with a straight curette.  Ligamentum flavum removed from the interspace as well as the scar tissue and epidural fibrosis with a combination of the 2.  We skeletonized the facet medially.  We detached the ligamentum flavum from the attachment on the caudad edge of 4.  We decompressed lateral recess to medial border pedicle secondary to ligamentum flavum hypertrophy remainder at the L4-5 interspace in the lateral recess in combination with facet hypertrophy compressing the 5 root and also extending out into the 4 root.  We performed foraminotomies of 4 and a generous foraminotomy of 5.  Removed ligamentum flavum from the interspace and gently mobilized the 5 root from the epidural fibrosis.  There  was no epidural fibrosis encasing the 4 root out.  The disk was identified. There was no evidence of rupture.  There was generous epidural venous plexus and this was cauterized, further mobilized the 5 root as this was tethered by the plexus.  It was felt the pedicle L5 medially, superiorly, and inferiorly, there was no evidence of hardware encroachment to the pedicle of L4 and onto the 5 root.  There was no evidence of loosening of hardware.  Kocher placed in the spinous process of 5 showed it to be no motion between 5-1 indicated solid fusion. There  was no hardware medially in the site for compression at 4-5 facet medially and I felt therefore that additional dissection for hardware removal was not necessary.  It would only increase the morbidity of the procedure.  Felt that the pathology noted in the lateral recess of the foramen of 5 was significant and consistent with her symptoms in the study was noted but 1-cm excursion of the 5 root medial pedicle without tension following decompression and probe was passed easily out of the foramen of 4 and 5.  No evidence of CSF leakage or active bleeding in the disk herniation.  After copious irrigation, thrombin-soaked Gelfoam was placed in the laminotomy defect.  Confirmatory radiograph obtained with probes in the foramen of 4 and 5.  McCullough retractor was removed.  Paraspinous muscles inspected and electrocautery was used to achieve hemostasis.  Copiously irrigated and closed with 1 Vicryl in a figure-of-eight sutures, subcu with 2-0.  Skin was reapproximated with 4-0 subcuticular Prolene.  Wound reinforced with Steri-Strips.  Sterile dressing applied.  Placed supine on the hospital bed, extubated without difficulty, and transported to recovery in satisfactory.  The patient tolerated the procedure well.  No complications.  BLOOD LOSS:  50 mL.     Jene Every, M.D.     Cordelia Pen  D:  05/31/2012  T:  06/01/2012  Job:  [id number]

## 2012-06-01 NOTE — Progress Notes (Signed)
Subjective: 1 Day Post-Op Procedure(s) (LRB): MICRO LUMBAR DECOMPRESSION L4 - L5, L5 - S1  (N/A) Patient reports pain as mild.  Notes incisional back pain this AM. No leg pain, numbness, tingling. Voiding without difficulty. No other c/o. Had not been OOB yet prior to AM rounds, has since done PT which she tolerated well.  Objective: Vital signs in last 24 hours: Temp:  [96.8 F (36 C)-98.5 F (36.9 C)] 97.7 F (36.5 C) (05/01 0526) Pulse Rate:  [62-99] 62 (05/01 0526) Resp:  [13-20] 16 (05/01 0526) BP: (92-118)/(43-75) 117/75 mmHg (05/01 0526) SpO2:  [97 %-100 %] 97 % (05/01 0526) Weight:  [66.588 kg (146 lb 12.8 oz)] 66.588 kg (146 lb 12.8 oz) (04/30 1317)  Intake/Output from previous day: 04/30 0701 - 05/01 0700 In: 3307.5 [P.O.:840; I.V.:2367.5; IV Piggyback:100] Out: 3820 [Urine:3770; Blood:50] Intake/Output this shift: Total I/O In: 240 [P.O.:240] Out: 225 [Urine:225]  No results found for this basename: HGB,  in the last 72 hours No results found for this basename: WBC, RBC, HCT, PLT,  in the last 72 hours No results found for this basename: NA, K, CL, CO2, BUN, CREATININE, GLUCOSE, CALCIUM,  in the last 72 hours No results found for this basename: LABPT, INR,  in the last 72 hours  Neurologically intact ABD soft Neurovascular intact Sensation intact distally Intact pulses distally Dorsiflexion/Plantar flexion intact Incision: dressing C/D/I and no drainage No cellulitis present Compartment soft No calf pain or sign of DVT  Assessment/Plan: 1 Day Post-Op Procedure(s) (LRB): MICRO LUMBAR DECOMPRESSION L4 - L5, L5 - S1  (N/A) Advance diet Up with therapy D/C IV fluids Discussed D/C instructions D/C home today  [name] M. 06/01/2012, 10:04 AM

## 2012-06-01 NOTE — Evaluation (Signed)
Physical Therapy Evaluation Patient Details Name: [name] MRN: [id number] DOB: 04/22/1950 Today's Date: 06/01/2012 Time: 8657-8469 PT Time Calculation (min): 22 min  PT Assessment / Plan / Recommendation Clinical Impression  Pt s/p L4-5 Lumbar decompression presents at supervision with assist device for ambulation and mobility.  Pt with good knowledge of precautions and limitations 2* several prior surgeries and suitable for d/c to home with family assist.    PT Assessment  Patent does not need any further PT services    Follow Up Recommendations  No PT follow up    Does the patient have the potential to tolerate intense rehabilitation      Barriers to Discharge        Equipment Recommendations  None recommended by PT    Recommendations for Other Services OT consult   Frequency      Precautions / Restrictions Precautions Precautions: Back Precaution Booklet Issued: Yes (comment) Precaution Comments: Pt able to recall 2/4 back precautions without cues Restrictions Weight Bearing Restrictions: No   Pertinent Vitals/Pain 3/10 with activity; pt premedicated      Mobility  Bed Mobility Bed Mobility: Rolling Left;Supine to Sit Rolling Left: 5: Supervision Supine to Sit: 5: Supervision Details for Bed Mobility Assistance: MIn VC for technique Transfers Transfers: Sit to Stand;Stand to Sit Sit to Stand: From bed;5: Supervision Stand to Sit: 5: Supervision;To chair/3-in-1 Details for Transfer Assistance: min cues for use of UEs to self assist Ambulation/Gait Ambulation/Gait Assistance: 4: Min guard;5: Supervision Ambulation Distance (Feet): 400 Feet Assistive device: Rolling walker Ambulation/Gait Assistance Details: min cues for position from RW Gait Pattern: Within Functional Limits Stairs:  (Reviewed verbally, pt comfortable with ability)    Exercises     PT Diagnosis:    PT Problem List:   PT Treatment Interventions:     PT Goals    Visit  Information  Last PT Received On: 06/01/12 Assistance Needed: +1    Subjective Data  Subjective: My leg feels much better than before surgery Patient Stated Goal: Resume previous lifestyle with decreased pain   Prior Functioning  Home Living Lives With: Alone Available Help at Discharge: Family Type of Home: House Home Access: Stairs to enter Secretary/administrator of Steps: 2 Entrance Stairs-Rails: Right Home Layout: One level Home Adaptive Equipment: Walker - rolling Prior Function Level of Independence: Independent Able to Take Stairs?: Yes Driving: Yes Vocation: On disability Communication Communication: No difficulties    Cognition  Cognition Arousal/Alertness: Awake/alert Behavior During Therapy: WFL for tasks assessed/performed Overall Cognitive Status: Within Functional Limits for tasks assessed    Extremity/Trunk Assessment Right Upper Extremity Assessment RUE ROM/Strength/Tone: [facility] for tasks assessed Left Upper Extremity Assessment LUE ROM/Strength/Tone: WFL for tasks assessed Right Lower Extremity Assessment RLE ROM/Strength/Tone: WFL for tasks assessed Left Lower Extremity Assessment LLE ROM/Strength/Tone: WFL for tasks assessed Trunk Assessment Trunk Assessment: Normal   Balance    End of Session PT - End of Session Activity Tolerance: Patient tolerated treatment well Patient left: in chair;with call bell/phone within reach Nurse Communication: Mobility status  GP Functional Assessment Tool Used: clinical judgement Functional Limitation: Mobility: Walking and moving around Mobility: Walking and Moving Around Current Status (G2952): At least 1 percent but less than 20 percent impaired, limited or restricted Mobility: Walking and Moving Around Goal Status [phone]): At least 1 percent but less than 20 percent impaired, limited or restricted Mobility: Walking and Moving Around Discharge Status [phone]): At least 1 percent but less than 20 percent impaired,  limited or restricted  River Mckercher 06/01/2012, 8:55 AM

## 2012-06-02 NOTE — Discharge Summary (Signed)
Physician Discharge Summary   Patient ID: [name] MRN: [id number] DOB/AGE: Mar 14, 1950 62 y.o.  Admit date: 05/31/2012 Discharge date: 06/02/2012  Primary Diagnosis:   Stenosis L4 - L5  Admission Diagnoses:  Past Medical History  Diagnosis Date  . Seasonal allergies   . Anxiety 2000  . Depression 2000  . GERD (gastroesophageal reflux disease)   . Headache   . Anemia   . PONV (postoperative nausea and vomiting)    Discharge Diagnoses:   Principal Problem:   Spinal stenosis, lumbar region, without neurogenic claudication  Procedure:  Procedure(s) (LRB): MICRO LUMBAR DECOMPRESSION L4 - L5, L5 - S1  (N/A)   Consults: None  HPI:  see H&P    Laboratory Data: Hospital Outpatient Visit on 05/22/2012  Component Date Value Range Status  . MRSA, PCR 05/22/2012 NEGATIVE  NEGATIVE Final  . Staphylococcus aureus 05/22/2012 NEGATIVE  NEGATIVE Final   Comment:                                 The Xpert SA Assay (FDA                          approved for NASAL specimens                          in patients over 29 years of age),                          is one component of                          a comprehensive surveillance                          program.  Test performance has                          been validated by [employer] for patients greater                          than or equal to 85 year old.                          It is not intended                          to diagnose infection nor to                          guide or monitor treatment.  . Sodium 05/22/2012 140  135 - 145 mEq/L Final  . Potassium 05/22/2012 4.9  3.5 - 5.1 mEq/L Final  . Chloride 05/22/2012 105  96 - 112 mEq/L Final  . CO2 05/22/2012 30  19 - 32 mEq/L Final  . Glucose, Bld 05/22/2012 95  70 - 99 mg/dL Final  . BUN 40/98/1191 8  6 - 23 mg/dL Final  . Creatinine, Ser 05/22/2012 0.76  0.50 - 1.10 mg/dL Final  .  Calcium 05/22/2012 9.8  8.4 - 10.5 mg/dL Final  .  GFR calc non Af Amer 05/22/2012 >90  >90 mL/min Final  . GFR calc Af Amer 05/22/2012 >90  >90 mL/min Final   Comment:                                 The eGFR has been calculated                          using the CKD EPI equation.                          This calculation has not been                          validated in all clinical                          situations.                          eGFR's persistently                          <90 mL/min signify                          possible Chronic Kidney Disease.  . WBC 05/22/2012 5.4  4.0 - 10.5 K/uL Final  . RBC 05/22/2012 3.91  3.87 - 5.11 MIL/uL Final  . Hemoglobin 05/22/2012 12.7  12.0 - 15.0 g/dL Final  . HCT 62/13/0865 39.2  36.0 - 46.0 % Final  . MCV 05/22/2012 100.3* 78.0 - 100.0 fL Final  . MCH 05/22/2012 32.5  26.0 - 34.0 pg Final  . MCHC 05/22/2012 32.4  30.0 - 36.0 g/dL Final  . RDW 78/46/9629 12.4  11.5 - 15.5 % Final  . Platelets 05/22/2012 269  150 - 400 K/uL Final   No results found for this basename: HGB,  in the last 72 hours No results found for this basename: WBC, RBC, HCT, PLT,  in the last 72 hours No results found for this basename: NA, K, CL, CO2, BUN, CREATININE, GLUCOSE, CALCIUM,  in the last 72 hours No results found for this basename: LABPT, INR,  in the last 72 hours  X-Rays:Dg Lumbar Spine 2-3 Views  05/22/2012  *RADIOLOGY REPORT*  Clinical Data: Preoperative films.  Patient for lumbar decompression.  LUMBAR SPINE - 2-3 VIEW  Comparison: Post-myelogram CT scan 12/13/2007.  Findings: The patient is status post L5-S1 laminectomy and fusion. Vertebral body height and alignment are maintained.  Intervertebral disc space height appears maintained.  IMPRESSION: No acute finding.  Status post L5-S1 fusion.   Original Report Authenticated By: Holley Dexter, M.D.    Dg Spine Portable 1 View  05/31/2012  *RADIOLOGY REPORT*  Clinical Data: Intraoperative localization.  PORTABLE SPINE - 1 VIEW  Comparison:  Previous films, same date.  Findings: There are surgical instruments marking the L4-5 and L5-S1 disc spaces.  IMPRESSION: L4-5 and L5-S1 marked intraoperatively.   Original Report Authenticated By: Rudie Meyer, M.D.    Dg Spine Portable 1 View  05/31/2012  *RADIOLOGY REPORT*  Clinical Data: Lumbar surgery.  PORTABLE SPINE - 1 VIEW  Comparison:  Intraoperative exam 05/31/2012 and preoperative CT 12/13/2007.  Findings: Prior fusion L5-S1.  Level assignment as on prior exams.  Superior metallic probe L4 spinous process level.  Metallic rakes immediately posterior to this region.  Inferior metallic probe L5 spinous process level.  IMPRESSION: Prior fusion L5-S1.  Level assignment as on prior exams.  Superior metallic probe L4 spinous process level.  Metallic rakes immediately posterior to this region.  Inferior metallic probe L5 spinous process level.   Original Report Authenticated By: Lacy Duverney, M.D.    Dg Spine Portable 1 View  05/31/2012  *RADIOLOGY REPORT*  Clinical Data: Intraoperative localization for spine surgery.  PORTABLE SPINE - 1 VIEW  Comparison: 05/22/2012.  Findings: Surgical fusion hardware noted at L5-S1.  There is a spinal needle projecting at the L3-4 disc space level and a second needle projecting at the L5-S1 level.  IMPRESSION: L3-4 and L5-S1 localized.   Original Report Authenticated By: Rudie Meyer, M.D.     EKG:No orders found for this or any previous visit.   Hospital Course: Patient was admitted to [facility] and taken to the OR and underwent the above state procedure without complications.  Patient tolerated the procedure well and was later transferred to the recovery room and then to the orthopaedic floor for postoperative care.  They were given PO and IV analgesics for pain control following their surgery.  They were given 24 hours of postoperative antibiotics.   PT was consulted postop to assist with mobility and transfers.  The patient was allowed to be WBAT with  therapy and was taught back precautions. Discharge planning was consulted to help with postop disposition and equipment needs.  Patient had a fair night on the evening of surgery and started to get up OOB with therapy on day one. Patient was seen in rounds and was ready to go home on day one.  They were given discharge instructions and dressing directions.  They were instructed on when to follow up in the office with Dr. Shelle Iron.  Discharge Medications: Prior to Admission medications   Medication Sig Start Date End Date Taking? Authorizing Provider  Calcium Carbonate-Vitamin D (CALCIUM 500 + D PO) Take 1 tablet by mouth daily.   Yes Historical Provider, MD  cholecalciferol (VITAMIN D) 1000 UNITS tablet Take 1,000 Units by mouth daily.   Yes Historical Provider, MD  estrogens, conjugated, (PREMARIN) 0.625 MG tablet Take 0.625 mg by mouth every morning.   Yes Historical Provider, MD  fexofenadine (ALLEGRA) 180 MG tablet Take 180 mg by mouth daily.   Yes Historical Provider, MD  fish oil-omega-3 fatty acids 1000 MG capsule Take 2 g by mouth daily.   Yes Historical Provider, MD  gabapentin (NEURONTIN) 300 MG capsule Take 300 mg by mouth 3 (three) times daily.   Yes Historical Provider, MD  lidocaine (LIDODERM) 5 % Place 1 patch onto the skin daily. Remove & Discard patch within 12 hours or as directed by MD   Yes Historical Provider, MD  niacin (SLO-NIACIN) 500 MG tablet Take 500 mg by mouth daily. Vitamin B 3   Yes Historical Provider, MD  oxybutynin (DITROPAN XL) 15 MG 24 hr tablet Take 15 mg by mouth daily.   Yes Historical Provider, MD  POTASSIUM GLUCONATE PO Take 1 tablet by mouth daily.   Yes Historical Provider, MD  vitamin C (ASCORBIC ACID) 500 MG tablet Take 500 mg by mouth daily.   Yes Historical Provider, MD  methocarbamol (ROBAXIN) 500 MG tablet Take 1 tablet (  500 mg total) by mouth 3 (three) times daily. 05/31/12   Javier Docker, MD  oxyCODONE-acetaminophen (PERCOCET) 7.5-325 MG per tablet  Take 1-2 tablets by mouth every 4 (four) hours as needed for pain. 05/31/12   Javier Docker, MD    Diet: low sodium heart healthy Activity:WBAT Follow-up:in 10-14 days Disposition - Home Discharged Condition: good   Discharge Orders   Future Orders Complete By Expires     Call MD / Call 911  As directed     Comments:      If you experience chest pain or shortness of breath, CALL 911 and be transported to the hospital emergency room.  If you develope a fever above 101 F, pus (white drainage) or increased drainage or redness at the wound, or calf pain, call your surgeon's office.    Constipation Prevention  As directed     Comments:      Drink plenty of fluids.  Prune juice may be helpful.  You may use a stool softener, such as Colace (over the counter) 100 mg twice a day.  Use MiraLax (over the counter) for constipation as needed.    Diet - low sodium heart healthy  As directed     Increase activity slowly as tolerated  As directed         Medication List    STOP taking these medications       HYDROcodone-acetaminophen 7.5-325 MG per tablet  Commonly known as:  NORCO      TAKE these medications       CALCIUM 500 + D PO  Take 1 tablet by mouth daily.     cholecalciferol 1000 UNITS tablet  Commonly known as:  VITAMIN D  Take 1,000 Units by mouth daily.     estrogens (conjugated) 0.625 MG tablet  Commonly known as:  PREMARIN  Take 0.625 mg by mouth every morning.     fexofenadine 180 MG tablet  Commonly known as:  ALLEGRA  Take 180 mg by mouth daily.     fish oil-omega-3 fatty acids 1000 MG capsule  Take 2 g by mouth daily.     gabapentin 300 MG capsule  Commonly known as:  NEURONTIN  Take 300 mg by mouth 3 (three) times daily.     lidocaine 5 %  Commonly known as:  LIDODERM  Place 1 patch onto the skin daily. Remove & Discard patch within 12 hours or as directed by MD     methocarbamol 500 MG tablet  Commonly known as:  ROBAXIN  Take 1 tablet (500 mg total)  by mouth 3 (three) times daily.     niacin 500 MG tablet  Commonly known as:  SLO-NIACIN  Take 500 mg by mouth daily. Vitamin B 3     oxybutynin 15 MG 24 hr tablet  Commonly known as:  DITROPAN XL  Take 15 mg by mouth daily.     oxyCODONE-acetaminophen 7.5-325 MG per tablet  Commonly known as:  PERCOCET  Take 1-2 tablets by mouth every 4 (four) hours as needed for pain.     POTASSIUM GLUCONATE PO  Take 1 tablet by mouth daily.     vitamin C 500 MG tablet  Commonly known as:  ASCORBIC ACID  Take 500 mg by mouth daily.           Follow-up Information   Follow up with BEANE,JEFFREY C, MD In 2 weeks.   Contact information:   [address] [place]  [id number] [phone]       Signed: Dorothy Spark. 06/02/2012, 7:35 AM

## 2012-06-08 NOTE — Anesthesia Postprocedure Evaluation (Signed)
  Anesthesia Post-op Note  Patient: [name]  Procedure(s) Performed: Procedure(s) (LRB): MICRO LUMBAR DECOMPRESSION L4 - L5, L5 - S1  (N/A)  Patient Location: PACU  Anesthesia Type: General  Level of Consciousness: awake and alert   Airway and Oxygen Therapy: Patient Spontanous Breathing  Post-op Pain: mild  Post-op Assessment: Post-op Vital signs reviewed, Patient's Cardiovascular Status Stable, Respiratory Function Stable, Patent Airway and No signs of Nausea or vomiting  Last Vitals:  Filed Vitals:   06/01/12 1019  BP: 126/75  Pulse: 69  Temp: 36.5 C  Resp: 16    Post-op Vital Signs: stable   Complications: No apparent anesthesia complications

## 2012-12-07 ENCOUNTER — Other Ambulatory Visit: Payer: Self-pay

## 2012-12-09 ENCOUNTER — Emergency Department (HOSPITAL_BASED_OUTPATIENT_CLINIC_OR_DEPARTMENT_OTHER)
Admission: EM | Admit: 2012-12-09 | Discharge: 2012-12-09 | Disposition: A | Discharge status: Home / Self Care | Payer: Worker's Compensation | Attending: Emergency Medicine | Admitting: Emergency Medicine

## 2012-12-09 ENCOUNTER — Encounter (HOSPITAL_BASED_OUTPATIENT_CLINIC_OR_DEPARTMENT_OTHER): Payer: Self-pay | Admitting: Emergency Medicine

## 2012-12-09 DIAGNOSIS — F411 Generalized anxiety disorder: Secondary | ICD-10-CM | POA: Insufficient documentation

## 2012-12-09 DIAGNOSIS — Z79899 Other long term (current) drug therapy: Secondary | ICD-10-CM | POA: Insufficient documentation

## 2012-12-09 DIAGNOSIS — S81009A Unspecified open wound, unspecified knee, initial encounter: Secondary | ICD-10-CM | POA: Insufficient documentation

## 2012-12-09 DIAGNOSIS — Z87891 Personal history of nicotine dependence: Secondary | ICD-10-CM | POA: Insufficient documentation

## 2012-12-09 DIAGNOSIS — Y990 Civilian activity done for income or pay: Secondary | ICD-10-CM | POA: Insufficient documentation

## 2012-12-09 DIAGNOSIS — Z8709 Personal history of other diseases of the respiratory system: Secondary | ICD-10-CM | POA: Insufficient documentation

## 2012-12-09 DIAGNOSIS — Y9289 Other specified places as the place of occurrence of the external cause: Secondary | ICD-10-CM | POA: Insufficient documentation

## 2012-12-09 DIAGNOSIS — W268XXA Contact with other sharp object(s), not elsewhere classified, initial encounter: Secondary | ICD-10-CM | POA: Insufficient documentation

## 2012-12-09 DIAGNOSIS — W208XXA Other cause of strike by thrown, projected or falling object, initial encounter: Secondary | ICD-10-CM | POA: Insufficient documentation

## 2012-12-09 DIAGNOSIS — S81812A Laceration without foreign body, left lower leg, initial encounter: Secondary | ICD-10-CM

## 2012-12-09 DIAGNOSIS — Z23 Encounter for immunization: Secondary | ICD-10-CM | POA: Insufficient documentation

## 2012-12-09 DIAGNOSIS — F329 Major depressive disorder, single episode, unspecified: Secondary | ICD-10-CM | POA: Insufficient documentation

## 2012-12-09 DIAGNOSIS — F3289 Other specified depressive episodes: Secondary | ICD-10-CM | POA: Insufficient documentation

## 2012-12-09 DIAGNOSIS — Z862 Personal history of diseases of the blood and blood-forming organs and certain disorders involving the immune mechanism: Secondary | ICD-10-CM | POA: Insufficient documentation

## 2012-12-09 DIAGNOSIS — Z8719 Personal history of other diseases of the digestive system: Secondary | ICD-10-CM | POA: Insufficient documentation

## 2012-12-09 DIAGNOSIS — Y939 Activity, unspecified: Secondary | ICD-10-CM | POA: Insufficient documentation

## 2012-12-09 MED ORDER — TETANUS-DIPHTH-ACELL PERTUSSIS 5-2.5-18.5 LF-MCG/0.5 IM SUSP
0.5000 mL | Freq: Once | INTRAMUSCULAR | Status: AC
  Administered 2012-12-09: 0.5 mL via INTRAMUSCULAR
  Filled 2012-12-09: qty 0.5

## 2012-12-09 NOTE — ED Notes (Signed)
Suture cart is at the bedside set up and ready for the doctor to use. 

## 2012-12-09 NOTE — ED Provider Notes (Signed)
CSN: [id number]     Arrival date & time 12/09/12  1452 History  This chart was scribed for Geoffery Lyons, MD by Dorothey Baseman, ED Scribe. This patient was seen in room MH01/MH01 and the patient's care was started at 4:20 PM.    Chief Complaint  Patient presents with  . Extremity Laceration   The history is provided by the patient. No language interpreter was used.   HPI Comments: [name] is a 62 y.o. female who presents to the Emergency Department complaining of a laceration to the left, lower leg that occurred PTA on a metal bracket when she states that a cart fell over onto her leg while she was at work. A bandage was applied PTA by EMS and the bleeding is well-controlled at this time. Patient reports an associated pain to the area. She states that she has been ambulatory since the incident, but that it seems to aggravate the bleeding. Patient reports that she does not know when her last tetanus vaccination was. She denies any allergies to medications. Patient reports a history of anemia.   Past Medical History  Diagnosis Date  . Seasonal allergies   . Anxiety 2000  . Depression 2000  . GERD (gastroesophageal reflux disease)   . Headache(784.0)   . Anemia   . PONV (postoperative nausea and vomiting)    Past Surgical History  Procedure Laterality Date  . Abdominal hysterectomy  1984  . Cholecystectomy  2003  . Neck surgery  2001,2007    herniated disc  . Ercp w/ sphincterotomy and balloon dilation  2005    pancreatitis   . Back surgery  2006, 2008    lower back, fusion in l5-s1  . Shoulder arthroscopy Bilateral 2010, 2011  . Lumbar laminectomy/decompression microdiscectomy N/A 05/31/2012    Procedure: MICRO LUMBAR DECOMPRESSION L4 - L5, L5 - S1 ;  Surgeon: Javier Docker, MD;  Location: [facility] ORS;  Service: Orthopedics;  Laterality: N/A;   No family history on file. History  Substance Use Topics  . Smoking status: Former Smoker -- 0.50 packs/day for 3 years    Types:  Cigarettes    Quit date: 02/02/1972  . Smokeless tobacco: Never Used  . Alcohol Use: No   OB History   Grav Para Term Preterm Abortions TAB SAB Ect Mult Living                 Review of Systems  A complete 10 system review of systems was obtained and all systems are negative except as noted in the HPI and PMH.   Allergies  Review of patient's allergies indicates no active allergies.  Home Medications   Current Outpatient Rx  Name  Route  Sig  Dispense  Refill  . Calcium Carbonate-Vitamin D (CALCIUM 500 + D PO)   Oral   Take 1 tablet by mouth daily.         . cholecalciferol (VITAMIN D) 1000 UNITS tablet   Oral   Take 1,000 Units by mouth daily.         Marland Kitchen estrogens, conjugated, (PREMARIN) 0.625 MG tablet   Oral   Take 0.625 mg by mouth every morning.         . fexofenadine (ALLEGRA) 180 MG tablet   Oral   Take 180 mg by mouth daily.         . fish oil-omega-3 fatty acids 1000 MG capsule   Oral   Take 2 g by mouth daily.         Marland Kitchen  gabapentin (NEURONTIN) 300 MG capsule   Oral   Take 300 mg by mouth 3 (three) times daily.         Marland Kitchen lidocaine (LIDODERM) 5 %   Transdermal   Place 1 patch onto the skin daily. Remove & Discard patch within 12 hours or as directed by MD         . methocarbamol (ROBAXIN) 500 MG tablet   Oral   Take 1 tablet (500 mg total) by mouth 3 (three) times daily.   40 tablet   1   . niacin (SLO-NIACIN) 500 MG tablet   Oral   Take 500 mg by mouth daily. Vitamin B 3         . oxybutynin (DITROPAN XL) 15 MG 24 hr tablet   Oral   Take 15 mg by mouth daily.         Marland Kitchen oxyCODONE-acetaminophen (PERCOCET) 7.5-325 MG per tablet   Oral   Take 1-2 tablets by mouth every 4 (four) hours as needed for pain.   60 tablet   0   . POTASSIUM GLUCONATE PO   Oral   Take 1 tablet by mouth daily.         . vitamin C (ASCORBIC ACID) 500 MG tablet   Oral   Take 500 mg by mouth daily.          Triage Vitals: BP 132/66  Temp(Src)  98.1 F (36.7 C) (Oral)  Resp 18  Ht 5\' 5"  (1.651 m)  Wt 150 lb (68.04 kg)  BMI 24.96 kg/m2  SpO2 99%  Physical Exam  Nursing note and vitals reviewed. Constitutional: She is oriented to person, place, and time. She appears well-developed and well-nourished. No distress.  HENT:  Head: Normocephalic and atraumatic.  Eyes: Conjunctivae are normal.  Neck: Normal range of motion. Neck supple.  Pulmonary/Chest: Effort normal. No respiratory distress.  Abdominal: She exhibits no distension.  Musculoskeletal: Normal range of motion.  Neurological: She is alert and oriented to person, place, and time.  Skin: Skin is warm and dry.  2.5 cm laceration to the anterior, left, lower leg without active bleeding.   Psychiatric: She has a normal mood and affect. Her behavior is normal.    ED Course  Procedures (including critical care time)  DIAGNOSTIC STUDIES: Oxygen Saturation is 99% on room air, normal by my interpretation.    COORDINATION OF CARE: 4:23 PM- Will irrigate the wound and repair it with sutures. Advised patient to keep the area clean and dry and to apply topical antibiotics. Advised patient to have the sutures removed in about 10 days. Advised patient to follow up if there are any new or worsening symptoms, especially pus drainage, fever, or red streaking. Discussed treatment plan with patient at bedside and patient verbalized agreement.   LACERATION REPAIR PROCEDURE NOTE The patient's identification was confirmed and consent was obtained. This procedure was performed by Geoffery Lyons, MD at 4:24 PM. Site: left, anterior, lower leg Sterile procedures observed Anesthetic used (type and amt): 2% lidocaine without epinephrine, 5 cc Suture type/size: 4.0 Ethylon  Length: 2.5 cm # of Sutures: 4 Technique: simple interrupted  Complexity: simple Antibx ointment applied Tetanus ordered Site anesthetized, irrigated with NS, explored without evidence of foreign body, wound well  approximated, site covered with dry, sterile dressing.  Patient tolerated procedure well without complications. Instructions for care discussed verbally and patient provided with additional written instructions for homecare and f/u.   Labs Review Labs Reviewed - No data  to display Imaging Review No results found.  EKG Interpretation   None       MDM  No diagnosis found. Laceration repaired with sutures. Please see procedure note and as recorded above. She is advised to perform local wound care and have sutures removed in the next 10 days. She is return as needed for signs or symptoms of infection. Tetanus updated.  I personally performed the services described in this documentation, which was scribed in my presence. The recorded information has been reviewed and is accurate.       Geoffery Lyons, MD 12/09/12 2144

## 2012-12-09 NOTE — ED Notes (Signed)
Sustained laceration to left lower leg-a cart fell over onto her leg and a metal bracket caused the laceration.  Bleeding controlled, bandage in place from EMS.

## 2012-12-23 ENCOUNTER — Encounter (HOSPITAL_BASED_OUTPATIENT_CLINIC_OR_DEPARTMENT_OTHER): Payer: Self-pay | Admitting: Emergency Medicine

## 2012-12-23 ENCOUNTER — Emergency Department (HOSPITAL_BASED_OUTPATIENT_CLINIC_OR_DEPARTMENT_OTHER)
Admission: EM | Admit: 2012-12-23 | Discharge: 2012-12-23 | Disposition: A | Discharge status: Home / Self Care | Payer: Worker's Compensation | Attending: Emergency Medicine | Admitting: Emergency Medicine

## 2012-12-23 DIAGNOSIS — Z87891 Personal history of nicotine dependence: Secondary | ICD-10-CM | POA: Insufficient documentation

## 2012-12-23 DIAGNOSIS — F329 Major depressive disorder, single episode, unspecified: Secondary | ICD-10-CM | POA: Insufficient documentation

## 2012-12-23 DIAGNOSIS — Z4801 Encounter for change or removal of surgical wound dressing: Secondary | ICD-10-CM | POA: Insufficient documentation

## 2012-12-23 DIAGNOSIS — Z79899 Other long term (current) drug therapy: Secondary | ICD-10-CM | POA: Insufficient documentation

## 2012-12-23 DIAGNOSIS — T8131XA Disruption of external operation (surgical) wound, not elsewhere classified, initial encounter: Secondary | ICD-10-CM

## 2012-12-23 DIAGNOSIS — F411 Generalized anxiety disorder: Secondary | ICD-10-CM | POA: Insufficient documentation

## 2012-12-23 DIAGNOSIS — IMO0001 Reserved for inherently not codable concepts without codable children: Secondary | ICD-10-CM | POA: Insufficient documentation

## 2012-12-23 DIAGNOSIS — F3289 Other specified depressive episodes: Secondary | ICD-10-CM | POA: Insufficient documentation

## 2012-12-23 DIAGNOSIS — Z862 Personal history of diseases of the blood and blood-forming organs and certain disorders involving the immune mechanism: Secondary | ICD-10-CM | POA: Insufficient documentation

## 2012-12-23 NOTE — ED Provider Notes (Signed)
CSN: [id number]     Arrival date & time 12/23/12  8295 History   First MD Initiated Contact with Patient 12/23/12 1012     Chief Complaint  Patient presents with  . Wound Check   (Consider location/radiation/quality/duration/timing/severity/associated sxs/prior Treatment) HPI Comments: Patient is here for a wound check on a laceration that she sustained on November 8. She was seen on November 8 after a cart fell on her leg and part of the metal corner caused a laceration to her left pretibial area. The laceration was repaired with sutures on November 8. She subsequently had them removed on November 10. She states at that time she asked them to put Steri-Strips across the wound but they wouldn't and today he she noticed that the wound is separating. She's been cleaning the wound regularly and using antibiotic ointment. She still has some swelling and pain to her left leg since the accident that happened at work. She's requesting a referral to followup with her orthopedist for a recheck. She did state she had x-rays done of her leg by her workers comp physician that were negative.Her TDAP has been updated.  Patient is a 62 y.o. female presenting with wound check.  Wound Check    Past Medical History  Diagnosis Date  . Seasonal allergies   . Anxiety 2000  . Depression 2000  . GERD (gastroesophageal reflux disease)   . Headache(784.0)   . Anemia   . PONV (postoperative nausea and vomiting)    Past Surgical History  Procedure Laterality Date  . Abdominal hysterectomy  1984  . Cholecystectomy  2003  . Neck surgery  2001,2007    herniated disc  . Ercp w/ sphincterotomy and balloon dilation  2005    pancreatitis   . Back surgery  2006, 2008    lower back, fusion in l5-s1  . Shoulder arthroscopy Bilateral 2010, 2011  . Lumbar laminectomy/decompression microdiscectomy N/A 05/31/2012    Procedure: MICRO LUMBAR DECOMPRESSION L4 - L5, L5 - S1 ;  Surgeon: Javier Docker, MD;  Location: [facility]  [facility];  Service: Orthopedics;  Laterality: N/A;   No family history on file. History  Substance Use Topics  . Smoking status: Former Smoker -- 0.50 packs/day for 3 years    Types: Cigarettes    Quit date: 02/02/1972  . Smokeless tobacco: Never Used  . Alcohol Use: No   OB History   Grav Para Term Preterm Abortions TAB SAB Ect Mult Living                 Review of Systems  Constitutional: Negative for fever.  Gastrointestinal: Negative for nausea and vomiting.  Musculoskeletal: Positive for myalgias. Negative for joint swelling.  Skin: Positive for wound.  Neurological: Negative for weakness and numbness.    Allergies  Review of patient's allergies indicates no active allergies.  Home Medications   Current Outpatient Rx  Name  Route  Sig  Dispense  Refill  . Calcium Carbonate-Vitamin D (CALCIUM 500 + D PO)   Oral   Take 1 tablet by mouth daily.         . cholecalciferol (VITAMIN D) 1000 UNITS tablet   Oral   Take 1,000 Units by mouth daily.         Marland Kitchen estrogens, conjugated, (PREMARIN) 0.625 MG tablet   Oral   Take 0.625 mg by mouth every morning.         . fexofenadine (ALLEGRA) 180 MG tablet   Oral   Take 180  mg by mouth daily.         . fish oil-omega-3 fatty acids 1000 MG capsule   Oral   Take 2 g by mouth daily.         Marland Kitchen gabapentin (NEURONTIN) 300 MG capsule   Oral   Take 300 mg by mouth 3 (three) times daily.         Marland Kitchen lidocaine (LIDODERM) 5 %   Transdermal   Place 1 patch onto the skin daily. Remove & Discard patch within 12 hours or as directed by MD         . methocarbamol (ROBAXIN) 500 MG tablet   Oral   Take 1 tablet (500 mg total) by mouth 3 (three) times daily.   40 tablet   1   . niacin (SLO-NIACIN) 500 MG tablet   Oral   Take 500 mg by mouth daily. Vitamin B 3         . oxybutynin (DITROPAN XL) 15 MG 24 hr tablet   Oral   Take 15 mg by mouth daily.         Marland Kitchen oxyCODONE-acetaminophen (PERCOCET) 7.5-325 MG per tablet    Oral   Take 1-2 tablets by mouth every 4 (four) hours as needed for pain.   60 tablet   0   . POTASSIUM GLUCONATE PO   Oral   Take 1 tablet by mouth daily.         . vitamin C (ASCORBIC ACID) 500 MG tablet   Oral   Take 500 mg by mouth daily.          BP 133/79  Pulse 81  Temp(Src) 97.8 F (36.6 C) (Oral)  Resp 18  Ht 5' 5.5" (1.664 m)  Wt 145 lb (65.772 kg)  BMI 23.75 kg/m2  SpO2 98% Physical Exam  Constitutional: She is oriented to person, place, and time. She appears well-developed and well-nourished.  Cardiovascular: Normal rate.   Pulmonary/Chest: Effort normal.  Musculoskeletal:  There is a 2 cm healing laceration to the left lower leg. There is no signs of infection. No erythema or drainage from the wound. There is a small amount of dehiscence to the skin layer. There is some ecchymosis along the medial aspect of the lower leg and some tenderness primarily along the musculature in the pretibial area. There's also some tenderness along the mid tibia. No step-offs or deformities are noted.  Neurological: She is alert and oriented to person, place, and time.    ED Course  Procedures (including critical care time) Labs Review Labs Reviewed - No data to display Imaging Review No results found.  EKG Interpretation   None       MDM   1. Wound dehiscence, initial encounter    Steri-Strips were applied to the wound although I did advise the patient that it's going to have to continue healing by secondary intention. She's to continue her ongoing wound care. She states she's had recent x-rays of the leg so I  did not feel that repeat imaging is needed today. She is requesting a referral to follow with her orthopedist I did give her that. Advised to return here as needed for any worsening symptoms or signs of infection.    Rolan Bucco, MD 12/23/12 1029

## 2012-12-23 NOTE — ED Notes (Signed)
Patient here to have previous sutured laceration to left lower leg rechecked. Had sutures 2 weeks ago and sutures removed after 10 days. Reports itching from wound, no bleeding or drainage noted. Small area at top of laceration appears superficially dehisced. Has been using antibiotic ointment as prescribed

## 2012-12-23 NOTE — ED Notes (Signed)
MD at bedside. 

## 2012-12-25 ENCOUNTER — Emergency Department (HOSPITAL_BASED_OUTPATIENT_CLINIC_OR_DEPARTMENT_OTHER)
Admission: EM | Admit: 2012-12-25 | Discharge: 2012-12-25 | Disposition: A | Discharge status: Home / Self Care | Payer: Worker's Compensation | Attending: Emergency Medicine | Admitting: Emergency Medicine

## 2012-12-25 ENCOUNTER — Encounter (HOSPITAL_BASED_OUTPATIENT_CLINIC_OR_DEPARTMENT_OTHER): Payer: Self-pay | Admitting: Emergency Medicine

## 2012-12-25 DIAGNOSIS — W208XXA Other cause of strike by thrown, projected or falling object, initial encounter: Secondary | ICD-10-CM | POA: Insufficient documentation

## 2012-12-25 DIAGNOSIS — Z87891 Personal history of nicotine dependence: Secondary | ICD-10-CM | POA: Insufficient documentation

## 2012-12-25 DIAGNOSIS — F329 Major depressive disorder, single episode, unspecified: Secondary | ICD-10-CM | POA: Insufficient documentation

## 2012-12-25 DIAGNOSIS — S81802D Unspecified open wound, left lower leg, subsequent encounter: Secondary | ICD-10-CM

## 2012-12-25 DIAGNOSIS — Z862 Personal history of diseases of the blood and blood-forming organs and certain disorders involving the immune mechanism: Secondary | ICD-10-CM | POA: Insufficient documentation

## 2012-12-25 DIAGNOSIS — Y929 Unspecified place or not applicable: Secondary | ICD-10-CM | POA: Insufficient documentation

## 2012-12-25 DIAGNOSIS — F3289 Other specified depressive episodes: Secondary | ICD-10-CM | POA: Insufficient documentation

## 2012-12-25 DIAGNOSIS — F411 Generalized anxiety disorder: Secondary | ICD-10-CM | POA: Insufficient documentation

## 2012-12-25 DIAGNOSIS — Y939 Activity, unspecified: Secondary | ICD-10-CM | POA: Insufficient documentation

## 2012-12-25 DIAGNOSIS — S81009A Unspecified open wound, unspecified knee, initial encounter: Secondary | ICD-10-CM | POA: Insufficient documentation

## 2012-12-25 DIAGNOSIS — Z79899 Other long term (current) drug therapy: Secondary | ICD-10-CM | POA: Insufficient documentation

## 2012-12-25 DIAGNOSIS — T148XXD Other injury of unspecified body region, subsequent encounter: Secondary | ICD-10-CM

## 2012-12-25 DIAGNOSIS — K219 Gastro-esophageal reflux disease without esophagitis: Secondary | ICD-10-CM | POA: Insufficient documentation

## 2012-12-25 NOTE — ED Provider Notes (Signed)
CSN: [id number]     Arrival date & time 12/25/12  1555 History  This chart was scribed for Candyce Churn, MD by Leone Payor, ED Scribe. This patient was seen in room MH04/MH04 and the patient's care was started 4:42 PM.    Chief Complaint  Patient presents with  . Wound Check    Patient is a 62 y.o. female presenting with wound check. The history is provided by the patient. No language interpreter was used.  Wound Check This is a recurrent problem. The current episode started more than 1 week ago. The problem has not changed since onset.Nothing aggravates the symptoms. Nothing relieves the symptoms.    HPI Comments: [name] is a 62 y.o. female who presents to the Emergency Department requesting a wound check on a laceration she sustained on 12/09/12. Pt states it was caused by a metal bracket on a cart that fell onto her left lower leg. Pt states she had sutures placed at the initial visit and had them removed 10 days later. Pt states she requested them to apply steri-strips over the wound but was refused. Pt returned here on 12/23/12 because the wound began to open and separate. On her second visit here, Steri-Strips were applied and she was told to continue wound care. Pt states she was at a store today and was squatting down when she noticed the wound re-open and begin to bleed again. She continues to have pain in the surrounding area and has noticed some mild swelling. She denies numbness or weakness.   Past Medical History  Diagnosis Date  . Seasonal allergies   . Anxiety 2000  . Depression 2000  . GERD (gastroesophageal reflux disease)   . Headache(784.0)   . Anemia   . PONV (postoperative nausea and vomiting)    Past Surgical History  Procedure Laterality Date  . Abdominal hysterectomy  1984  . Cholecystectomy  2003  . Neck surgery  2001,2007    herniated disc  . Ercp w/ sphincterotomy and balloon dilation  2005    pancreatitis   . Back surgery  2006, 2008   lower back, fusion in l5-s1  . Shoulder arthroscopy Bilateral 2010, 2011  . Lumbar laminectomy/decompression microdiscectomy N/A 05/31/2012    Procedure: MICRO LUMBAR DECOMPRESSION L4 - L5, L5 - S1 ;  Surgeon: Javier Docker, MD;  Location: [facility];  Service: Orthopedics;  Laterality: N/A;   No family history on file. History  Substance Use Topics  . Smoking status: Former Smoker -- 0.50 packs/day for 3 years    Types: Cigarettes    Quit date: 02/02/1972  . Smokeless tobacco: Never Used  . Alcohol Use: No   OB History   Grav Para Term Preterm Abortions TAB SAB Ect Mult Living                 Review of Systems  Skin: Positive for wound.  Neurological: Negative for weakness and numbness.  All other systems reviewed and are negative.    Allergies  Review of patient's allergies indicates no active allergies.  Home Medications   Current Outpatient Rx  Name  Route  Sig  Dispense  Refill  . HYDROcodone-acetaminophen (NORCO) 7.5-325 MG per tablet   Oral   Take 1 tablet by mouth every 6 (six) hours as needed for moderate pain.         . Calcium Carbonate-Vitamin D (CALCIUM 500 + D PO)   Oral   Take 1 tablet by mouth daily.         Marland Kitchen  cholecalciferol (VITAMIN D) 1000 UNITS tablet   Oral   Take 1,000 Units by mouth daily.         Marland Kitchen estrogens, conjugated, (PREMARIN) 0.625 MG tablet   Oral   Take 0.625 mg by mouth every morning.         . fexofenadine (ALLEGRA) 180 MG tablet   Oral   Take 180 mg by mouth daily.         . fish oil-omega-3 fatty acids 1000 MG capsule   Oral   Take 2 g by mouth daily.         Marland Kitchen gabapentin (NEURONTIN) 300 MG capsule   Oral   Take 300 mg by mouth 3 (three) times daily.         Marland Kitchen lidocaine (LIDODERM) 5 %   Transdermal   Place 1 patch onto the skin daily. Remove & Discard patch within 12 hours or as directed by MD         . methocarbamol (ROBAXIN) 500 MG tablet   Oral   Take 1 tablet (500 mg total) by mouth 3 (three) times  daily.   40 tablet   1   . niacin (SLO-NIACIN) 500 MG tablet   Oral   Take 500 mg by mouth daily. Vitamin B 3         . oxybutynin (DITROPAN XL) 15 MG 24 hr tablet   Oral   Take 15 mg by mouth daily.         Marland Kitchen oxyCODONE-acetaminophen (PERCOCET) 7.5-325 MG per tablet   Oral   Take 1-2 tablets by mouth every 4 (four) hours as needed for pain.   60 tablet   0   . POTASSIUM GLUCONATE PO   Oral   Take 1 tablet by mouth daily.         . vitamin C (ASCORBIC ACID) 500 MG tablet   Oral   Take 500 mg by mouth daily.          BP 121/59  Pulse 74  Temp(Src) 98.8 F (37.1 C)  Resp 18  Ht 5' 5.5" (1.664 m)  Wt 145 lb (65.772 kg)  BMI 23.75 kg/m2  SpO2 100% Physical Exam  Nursing note and vitals reviewed. Constitutional: She is oriented to person, place, and time. She appears well-developed and well-nourished. No distress.  HENT:  Head: Normocephalic and atraumatic.  Eyes: Conjunctivae are normal. No scleral icterus.  Neck: Neck supple.  Cardiovascular: Normal rate and intact distal pulses.   Pulmonary/Chest: Effort normal. No stridor. No respiratory distress.  Abdominal: Normal appearance. She exhibits no distension.  Neurological: She is alert and oriented to person, place, and time.  Skin: Skin is warm and dry. No rash noted.  Wound to left shin.  No active bleeding.  No surrounding erythema.  No fluctuance or masses.  Wound is slightly open.    Psychiatric: She has a normal mood and affect. Her behavior is normal.    ED Course  Procedures   DIAGNOSTIC STUDIES: Oxygen Saturation is 100% on RA, normal by my interpretation.    COORDINATION OF CARE: 4:42 PM Discussed treatment plan with pt at bedside and pt agreed to plan.   Labs Review Labs Reviewed - No data to display Imaging Review No results found.  EKG Interpretation   None       MDM   1. Delayed wound healing   2. Leg wound, left, subsequent encounter    62 yo with two week old wound.  Steristripped a few days ago.  Wound continues to be poorly healing, although it shows no signs of active infection.  Will place steri stips again today, but she will need close follow up with the wound center or her primary doctor.    I personally performed the services described in this documentation, which was scribed in my presence. The recorded information has been reviewed and is accurate.     Candyce Churn, MD 12/25/12 [phone]

## 2012-12-25 NOTE — ED Notes (Signed)
Pt. Was seen for wound on Sat. AM here at med center and had strei strips placed on wound on the lower L leg.

## 2013-01-11 ENCOUNTER — Encounter (HOSPITAL_BASED_OUTPATIENT_CLINIC_OR_DEPARTMENT_OTHER): Payer: Worker's Compensation

## 2013-11-16 ENCOUNTER — Other Ambulatory Visit: Payer: Self-pay

## 2014-01-30 ENCOUNTER — Ambulatory Visit: Payer: Self-pay | Admitting: Orthopedic Surgery

## 2014-02-04 ENCOUNTER — Ambulatory Visit: Payer: Self-pay | Admitting: Orthopedic Surgery

## 2014-02-04 NOTE — Patient Instructions (Signed)
[name]  02/04/2014   Your procedure is scheduled on: 02/13/14   Report to [facility]  Entrance and follow signs to               [place] at 9:00 AM.   Call this number if you have problems the morning of surgery [phone]   Remember:  Do not eat food or drink liquids :After Midnight.     Take these medicines the morning of surgery with A SIP OF WATER:                                You may not have any metal on your body including hair pins and              piercings  Do not wear jewelry, make-up, lotions, powders or perfumes.             Do not wear nail polish.  Do not shave  48 hours prior to surgery.              Men may shave face and neck.   Do not bring valuables to the hospital. [name].  Contacts, dentures or bridgework may not be worn into surgery.  Leave suitcase in the car. After surgery it may be brought to your room.     Patients discharged the day of surgery will not be allowed to drive home.  Name and phone number of your driver:  Special Instructions: N/A              Please read over the following fact sheets you were given: _____________________________________________________________________                                                     [name]  Before surgery, you can play an important role.  Because skin is not sterile, your skin needs to be as free of germs as possible.  You can reduce the number of germs on your skin by washing with CHG (chlorahexidine gluconate) soap before surgery.  CHG is an antiseptic cleaner which kills germs and bonds with the skin to continue killing germs even after washing. Please DO NOT use if you have an allergy to CHG or antibacterial soaps.  If your skin becomes reddened/irritated stop using the CHG and inform your nurse when you arrive at Short Stay. Do not shave (including legs and underarms) for  at least 48 hours prior to the first CHG shower.  You may shave your face. Please follow these instructions carefully:   1.  Shower with CHG Soap the night before surgery and the  morning of Surgery.   2.  If you choose to wash your hair, wash your hair first as usual with your  normal  Shampoo.   3.  After you shampoo, rinse your hair and body thoroughly to remove the  shampoo.  4.  Use CHG as you would any other liquid soap.  You can apply chg directly  to the skin and wash . Gently wash with scrungie or clean wascloth    5.  Apply the CHG Soap to your body ONLY FROM THE NECK DOWN.   Do not use on open                           Wound or open sores. Avoid contact with eyes, ears mouth and genitals (private parts).                        Genitals (private parts) with your normal soap.              6.  Wash thoroughly, paying special attention to the area where your surgery  will be performed.   7.  Thoroughly rinse your body with warm water from the neck down.   8.  DO NOT shower/wash with your normal soap after using and rinsing off  the CHG Soap .                9.  Pat yourself dry with a clean towel.             10.  Wear clean pajamas.             11.  Place clean sheets on your bed the night of your first shower and do not  sleep with pets.  Day of Surgery : Do not apply any lotions/deodorants the morning of surgery.  Please wear clean clothes to the hospital/surgery center.  FAILURE TO FOLLOW THESE INSTRUCTIONS MAY RESULT IN THE CANCELLATION OF YOUR SURGERY    PATIENT SIGNATURE_________________________________  ______________________________________________________________________     [name]  An incentive spirometer is a tool that can help keep your lungs clear and active. This tool measures how well you are filling your lungs with each breath. Taking long deep breaths may help reverse or decrease the chance of  developing breathing (pulmonary) problems (especially infection) following:  A long period of time when you are unable to move or be active. BEFORE THE PROCEDURE   If the spirometer includes an indicator to show your best effort, your nurse or respiratory therapist will set it to a desired goal.  If possible, sit up straight or lean slightly forward. Try not to slouch.  Hold the incentive spirometer in an upright position. INSTRUCTIONS FOR USE  1. Sit on the edge of your bed if possible, or sit up as far as you can in bed or on a chair. 2. Hold the incentive spirometer in an upright position. 3. Breathe out normally. 4. Place the mouthpiece in your mouth and seal your lips tightly around it. 5. Breathe in slowly and as deeply as possible, raising the piston or the ball toward the top of the column. 6. Hold your breath for 3-5 seconds or for as long as possible. Allow the piston or ball to fall to the bottom of the column. 7. Remove the mouthpiece from your mouth and breathe out normally. 8. Rest for a few seconds and repeat Steps 1 through 7 at least 10 times every 1-2 hours when you are awake. Take your time and take a few normal breaths between deep breaths. 9. The spirometer may include an indicator to show your best effort. Use the indicator as a goal to work toward during  each repetition. 10. After each set of 10 deep breaths, practice coughing to be sure your lungs are clear. If you have an incision (the cut made at the time of surgery), support your incision when coughing by placing a pillow or rolled up towels firmly against it. Once you are able to get out of bed, walk around indoors and cough well. You may stop using the incentive spirometer when instructed by your caregiver.  RISKS AND COMPLICATIONS  Take your time so you do not get dizzy or light-headed.  If you are in pain, you may need to take or ask for pain medication before doing incentive spirometry. It is harder to take a  deep breath if you are having pain. AFTER USE  Rest and breathe slowly and easily.  It can be helpful to keep track of a log of your progress. Your caregiver can provide you with a simple table to help with this. If you are using the spirometer at home, follow these instructions: [place] IF:   You are having difficultly using the spirometer.  You have trouble using the spirometer as often as instructed.  Your pain medication is not giving enough relief while using the spirometer.  You develop fever of 100.5 F (38.1 C) or higher. SEEK IMMEDIATE MEDICAL CARE IF:   You cough up bloody sputum that had not been present before.  You develop fever of 102 F (38.9 C) or greater.  You develop worsening pain at or near the incision site. MAKE SURE YOU:   Understand these instructions.  Will watch your condition.  Will get help right away if you are not doing well or get worse. Document Released: 05/31/2006 Document Revised: 04/12/2011 Document Reviewed: 08/01/2006 [facility] Patient Information 2014 [place], [place].   ________________________________________________________________________

## 2014-02-05 ENCOUNTER — Encounter (HOSPITAL_COMMUNITY): Payer: Self-pay

## 2014-02-05 ENCOUNTER — Ambulatory Visit: Payer: Self-pay | Admitting: Orthopedic Surgery

## 2014-02-05 ENCOUNTER — Encounter (HOSPITAL_COMMUNITY)
Admission: RE | Admit: 2014-02-05 | Discharge: 2014-02-05 | Disposition: A | Discharge status: Home / Self Care | Payer: Worker's Compensation | Source: Ambulatory Visit | Attending: Specialist | Admitting: Specialist

## 2014-02-05 ENCOUNTER — Ambulatory Visit (HOSPITAL_COMMUNITY)
Admission: RE | Admit: 2014-02-05 | Discharge: 2014-02-05 | Disposition: A | Discharge status: Home / Self Care | Payer: Worker's Compensation | Source: Ambulatory Visit | Attending: Orthopedic Surgery | Admitting: Orthopedic Surgery

## 2014-02-05 DIAGNOSIS — Z981 Arthrodesis status: Secondary | ICD-10-CM | POA: Insufficient documentation

## 2014-02-05 DIAGNOSIS — Z22321 Carrier or suspected carrier of Methicillin susceptible Staphylococcus aureus: Secondary | ICD-10-CM | POA: Diagnosis not present

## 2014-02-05 DIAGNOSIS — Z01818 Encounter for other preprocedural examination: Secondary | ICD-10-CM | POA: Insufficient documentation

## 2014-02-05 DIAGNOSIS — M5126 Other intervertebral disc displacement, lumbar region: Secondary | ICD-10-CM

## 2014-02-05 LAB — CBC
HCT: 41.6 % (ref 36.0–46.0)
Hemoglobin: 13.3 g/dL (ref 12.0–15.0)
MCH: 32.4 pg (ref 26.0–34.0)
MCHC: 32 g/dL (ref 30.0–36.0)
MCV: 101.5 fL — AB (ref 78.0–100.0)
Platelets: 343 10*3/uL (ref 150–400)
RBC: 4.1 MIL/uL (ref 3.87–5.11)
RDW: 12.7 % (ref 11.5–15.5)
WBC: 8.8 10*3/uL (ref 4.0–10.5)

## 2014-02-05 LAB — BASIC METABOLIC PANEL
ANION GAP: 8 (ref 5–15)
BUN: 17 mg/dL (ref 6–23)
CHLORIDE: 103 meq/L (ref 96–112)
CO2: 28 mmol/L (ref 19–32)
Calcium: 9.6 mg/dL (ref 8.4–10.5)
Creatinine, Ser: 0.81 mg/dL (ref 0.50–1.10)
GFR, EST AFRICAN AMERICAN: 89 mL/min — AB (ref >90)
GFR, EST NON AFRICAN AMERICAN: 77 mL/min — AB (ref >90)
Glucose, Bld: 95 mg/dL (ref 70–99)
POTASSIUM: 5 mmol/L (ref 3.5–5.1)
Sodium: 139 mmol/L (ref 135–145)

## 2014-02-05 LAB — SURGICAL PCR SCREEN
MRSA, PCR: NEGATIVE
STAPHYLOCOCCUS AUREUS: POSITIVE — AB

## 2014-02-05 NOTE — Patient Instructions (Addendum)
[name]  02/05/2014   Your procedure is scheduled on:   02-06-2013 Wednesday  Enter through [facility] Entrance and follow signs to [facility]. Arrive at    0630    AM ..  Call this number if you have problems the morning of surgery: [phone]  Or Presurgical Testing [phone].   For Living Will and/or Health Care Power Attorney Forms: please provide copy for your medical record,may bring AM of surgery(Forms should be already notarized -we do not provide this service).(02-05-14  No, information preferred today and given).    Do not eat food/ or drink: After Midnight.     Take these medicines the morning of surgery with A SIP OF WATER: Premarin. Allegra. Gabapentin. Hydrocodone.   Do not wear jewelry, make-up or nail polish.  Do not wear deodorant, lotions, powders, or perfumes.   Do not shave legs and under arms- 48 hours(2 days) prior to first CHG shower.(Shaving face and neck okay.)  Do not bring valuables to the hospital.(Hospital is not responsible for lost valuables).  Contacts, dentures or removable bridgework, body piercing, hair pins may not be worn into surgery.  Leave suitcase in the car. After surgery it may be brought to your room.  For patients admitted to the hospital, checkout time is 11:00 AM the day of discharge.(Restricted visitors-Any Persons displaying flu-like symptoms or illness).    Patients discharged the day of surgery will not be allowed to drive home. Must have responsible person with you x 24 hours once discharged.  Name and phone number of your driver: [name], friend- [phone] cell     Please read over the following fact sheets that you were given:  CHG(Chlorhexidine Gluconate 4% Surgical Soap) use, MRSA Information,Incentive Spirometry Instruction.          [place] - Preparing for Surgery Before surgery, you can play an important role.  Because skin is not sterile, your skin needs to be as free of germs as  possible.  You can reduce the number of germs on your skin by washing with CHG (chlorahexidine gluconate) soap before surgery.  CHG is an antiseptic cleaner which kills germs and bonds with the skin to continue killing germs even after washing. Please DO NOT use if you have an allergy to CHG or antibacterial soaps.  If your skin becomes reddened/irritated stop using the CHG and inform your nurse when you arrive at Short Stay. Do not shave (including legs and underarms) for at least 48 hours prior to the first CHG shower.  You may shave your face/neck. Please follow these instructions carefully:  1.  Shower with CHG Soap the night before surgery and the  morning of Surgery.  2.  If you choose to wash your hair, wash your hair first as usual with your  normal  shampoo.  3.  After you shampoo, rinse your hair and body thoroughly to remove the  shampoo.                           4.  Use CHG as you would any other liquid soap.  You can apply chg directly  to the skin and wash                       Gently with a scrungie or clean washcloth.  5.  Apply the CHG Soap to your body ONLY FROM THE NECK DOWN.  Do not use on face/ open                           Wound or open sores. Avoid contact with eyes, ears mouth and genitals (private parts).                       Wash face,  Genitals (private parts) with your normal soap.             6.  Wash thoroughly, paying special attention to the area where your surgery  will be performed.  7.  Thoroughly rinse your body with warm water from the neck down.  8.  DO NOT shower/wash with your normal soap after using and rinsing off  the CHG Soap.                9.  Pat yourself dry with a clean towel.            10.  Wear clean pajamas.            11.  Place clean sheets on your bed the night of your first shower and do not  sleep with pets. Day of Surgery : Do not apply any lotions/deodorants the morning of surgery.  Please wear clean clothes to the hospital/surgery  center.  FAILURE TO FOLLOW THESE INSTRUCTIONS MAY RESULT IN THE CANCELLATION OF YOUR SURGERY PATIENT SIGNATURE_________________________________  NURSE SIGNATURE__________________________________  ________________________________________________________________________   [name]  An incentive spirometer is a tool that can help keep your lungs clear and active. This tool measures how well you are filling your lungs with each breath. Taking long deep breaths may help reverse or decrease the chance of developing breathing (pulmonary) problems (especially infection) following:  A long period of time when you are unable to move or be active. BEFORE THE PROCEDURE   If the spirometer includes an indicator to show your best effort, your nurse or respiratory therapist will set it to a desired goal.  If possible, sit up straight or lean slightly forward. Try not to slouch.  Hold the incentive spirometer in an upright position. INSTRUCTIONS FOR USE  1. Sit on the edge of your bed if possible, or sit up as far as you can in bed or on a chair. 2. Hold the incentive spirometer in an upright position. 3. Breathe out normally. 4. Place the mouthpiece in your mouth and seal your lips tightly around it. 5. Breathe in slowly and as deeply as possible, raising the piston or the ball toward the top of the column. 6. Hold your breath for 3-5 seconds or for as long as possible. Allow the piston or ball to fall to the bottom of the column. 7. Remove the mouthpiece from your mouth and breathe out normally. 8. Rest for a few seconds and repeat Steps 1 through 7 at least 10 times every 1-2 hours when you are awake. Take your time and take a few normal breaths between deep breaths. 9. The spirometer may include an indicator to show your best effort. Use the indicator as a goal to work toward during each repetition. 10. After each set of 10 deep breaths, practice coughing to be sure your lungs are  clear. If you have an incision (the cut made at the time of surgery), support your incision when coughing by placing a pillow or rolled up towels firmly against it. Once you are able to get out  of bed, walk around indoors and cough well. You may stop using the incentive spirometer when instructed by your caregiver.  RISKS AND COMPLICATIONS  Take your time so you do not get dizzy or light-headed.  If you are in pain, you may need to take or ask for pain medication before doing incentive spirometry. It is harder to take a deep breath if you are having pain. AFTER USE  Rest and breathe slowly and easily.  It can be helpful to keep track of a log of your progress. Your caregiver can provide you with a simple table to help with this. If you are using the spirometer at home, follow these instructions: [place] IF:   You are having difficultly using the spirometer.  You have trouble using the spirometer as often as instructed.  Your pain medication is not giving enough relief while using the spirometer.  You develop fever of 100.5 F (38.1 C) or higher. SEEK IMMEDIATE MEDICAL CARE IF:   You cough up bloody sputum that had not been present before.  You develop fever of 102 F (38.9 C) or greater.  You develop worsening pain at or near the incision site. MAKE SURE YOU:   Understand these instructions.  Will watch your condition.  Will get help right away if you are not doing well or get worse. Document Released: 05/31/2006 Document Revised: 04/12/2011 Document Reviewed: 08/01/2006 [facility] Patient Information 2014 [place], [place].   ________________________________________________________________________

## 2014-02-05 NOTE — Progress Notes (Signed)
02-05-14 1325 PCR positive for Staph aureus, RX for Mupirocin called to [facility] [phone] per pt request, pt to use as directed. Note per Epic to Dr. Reather Littler office pod [phone].

## 2014-02-05 NOTE — Pre-Procedure Instructions (Signed)
02-05-14 1330 Pt made aware Positive PCR for Staph-use Mupirocin as directed.

## 2014-02-05 NOTE — Pre-Procedure Instructions (Signed)
Lumbar spine Xray today per order.

## 2014-02-06 ENCOUNTER — Ambulatory Visit (HOSPITAL_COMMUNITY): Payer: Worker's Compensation

## 2014-02-06 ENCOUNTER — Ambulatory Visit (HOSPITAL_COMMUNITY): Payer: Worker's Compensation | Admitting: Anesthesiology

## 2014-02-06 ENCOUNTER — Encounter (HOSPITAL_COMMUNITY)
Admission: RE | Disposition: A | Discharge status: Home / Self Care | Payer: Self-pay | Source: Ambulatory Visit | Attending: Specialist

## 2014-02-06 ENCOUNTER — Ambulatory Visit (HOSPITAL_COMMUNITY)
Admission: RE | Admit: 2014-02-06 | Discharge: 2014-02-08 | Disposition: A | Discharge status: Home / Self Care | Payer: Worker's Compensation | Source: Ambulatory Visit | Attending: Specialist | Admitting: Specialist

## 2014-02-06 ENCOUNTER — Inpatient Hospital Stay (HOSPITAL_COMMUNITY)
Admission: RE | Admit: 2014-02-06 | Discharge: 2014-02-06 | Disposition: A | Discharge status: Home / Self Care | Payer: Self-pay | Source: Ambulatory Visit

## 2014-02-06 ENCOUNTER — Encounter (HOSPITAL_COMMUNITY): Payer: Self-pay | Admitting: Anesthesiology

## 2014-02-06 DIAGNOSIS — Z79899 Other long term (current) drug therapy: Secondary | ICD-10-CM | POA: Insufficient documentation

## 2014-02-06 DIAGNOSIS — K219 Gastro-esophageal reflux disease without esophagitis: Secondary | ICD-10-CM | POA: Insufficient documentation

## 2014-02-06 DIAGNOSIS — D649 Anemia, unspecified: Secondary | ICD-10-CM | POA: Diagnosis not present

## 2014-02-06 DIAGNOSIS — F419 Anxiety disorder, unspecified: Secondary | ICD-10-CM | POA: Insufficient documentation

## 2014-02-06 DIAGNOSIS — F329 Major depressive disorder, single episode, unspecified: Secondary | ICD-10-CM | POA: Insufficient documentation

## 2014-02-06 DIAGNOSIS — M4806 Spinal stenosis, lumbar region: Secondary | ICD-10-CM | POA: Insufficient documentation

## 2014-02-06 DIAGNOSIS — Z87891 Personal history of nicotine dependence: Secondary | ICD-10-CM | POA: Diagnosis not present

## 2014-02-06 DIAGNOSIS — M48061 Spinal stenosis, lumbar region without neurogenic claudication: Secondary | ICD-10-CM | POA: Diagnosis present

## 2014-02-06 DIAGNOSIS — Z419 Encounter for procedure for purposes other than remedying health state, unspecified: Secondary | ICD-10-CM

## 2014-02-06 HISTORY — PX: LUMBAR LAMINECTOMY/DECOMPRESSION MICRODISCECTOMY: SHX5026

## 2014-02-06 SURGERY — LUMBAR LAMINECTOMY/DECOMPRESSION MICRODISCECTOMY
Anesthesia: General | Site: Back | Laterality: Right

## 2014-02-06 MED ORDER — HYDROMORPHONE HCL 1 MG/ML IJ SOLN
INTRAMUSCULAR | Status: AC
  Administered 2014-02-06: 0.25 mg via INTRAVENOUS
  Filled 2014-02-06: qty 1

## 2014-02-06 MED ORDER — CEFAZOLIN SODIUM-DEXTROSE 2-3 GM-% IV SOLR
2.0000 g | Freq: Three times a day (TID) | INTRAVENOUS | Status: AC
  Administered 2014-02-06 – 2014-02-07 (×3): 2 g via INTRAVENOUS
  Filled 2014-02-06 (×3): qty 50

## 2014-02-06 MED ORDER — LACTATED RINGERS IV SOLN
INTRAVENOUS | Status: DC | PRN
  Administered 2014-02-06 (×2): via INTRAVENOUS

## 2014-02-06 MED ORDER — ACETAMINOPHEN 650 MG RE SUPP: 650.0000 mg | RECTAL | Status: DC | PRN

## 2014-02-06 MED ORDER — ESTROGENS CONJUGATED 0.45 MG PO TABS
0.4500 mg | ORAL_TABLET | Freq: Every morning | ORAL | Status: DC
  Administered 2014-02-07 – 2014-02-08 (×2): 0.45 mg via ORAL
  Filled 2014-02-06 (×2): qty 1

## 2014-02-06 MED ORDER — LACTATED RINGERS IV SOLN: INTRAVENOUS | Status: DC

## 2014-02-06 MED ORDER — CLINDAMYCIN PHOSPHATE 900 MG/50ML IV SOLN
900.0000 mg | INTRAVENOUS | Status: AC
  Administered 2014-02-06: 900 mg via INTRAVENOUS

## 2014-02-06 MED ORDER — SODIUM CHLORIDE 0.9 % IR SOLN
Status: DC | PRN
  Administered 2014-02-06: 500 mL

## 2014-02-06 MED ORDER — HYDROMORPHONE HCL 1 MG/ML IJ SOLN: 0.5000 mg | INTRAMUSCULAR | Status: DC | PRN

## 2014-02-06 MED ORDER — METOCLOPRAMIDE HCL 5 MG/ML IJ SOLN
INTRAMUSCULAR | Status: DC | PRN
  Administered 2014-02-06: 10 mg via INTRAVENOUS

## 2014-02-06 MED ORDER — ONDANSETRON HCL 4 MG/2ML IJ SOLN
INTRAMUSCULAR | Status: DC | PRN
  Administered 2014-02-06: 4 mg via INTRAVENOUS

## 2014-02-06 MED ORDER — OMEGA-3-ACID ETHYL ESTERS 1 G PO CAPS
1.0000 g | ORAL_CAPSULE | Freq: Two times a day (BID) | ORAL | Status: DC
  Administered 2014-02-06 – 2014-02-08 (×4): 1 g via ORAL
  Filled 2014-02-06 (×5): qty 1

## 2014-02-06 MED ORDER — VITAMIN D3 25 MCG (1000 UNIT) PO TABS
1000.0000 [IU] | ORAL_TABLET | Freq: Every day | ORAL | Status: DC
  Administered 2014-02-07 – 2014-02-08 (×2): 1000 [IU] via ORAL
  Filled 2014-02-06 (×2): qty 1

## 2014-02-06 MED ORDER — KCL IN DEXTROSE-NACL 20-5-0.45 MEQ/L-%-% IV SOLN
INTRAVENOUS | Status: AC
  Administered 2014-02-06: 15:00:00 via INTRAVENOUS
  Filled 2014-02-06 (×2): qty 1000

## 2014-02-06 MED ORDER — PANTOPRAZOLE SODIUM 40 MG PO TBEC
40.0000 mg | DELAYED_RELEASE_TABLET | Freq: Every day | ORAL | Status: DC
  Administered 2014-02-07 – 2014-02-08 (×2): 40 mg via ORAL
  Filled 2014-02-06 (×2): qty 1

## 2014-02-06 MED ORDER — OMEGA-3-ACID ETHYL ESTERS 1 G PO CAPS
1.0000 g | ORAL_CAPSULE | Freq: Two times a day (BID) | ORAL | Status: DC
  Filled 2014-02-06 (×2): qty 1

## 2014-02-06 MED ORDER — PROPOFOL 10 MG/ML IV BOLUS
INTRAVENOUS | Status: DC | PRN
  Administered 2014-02-06: 110 mg via INTRAVENOUS

## 2014-02-06 MED ORDER — OXYCODONE-ACETAMINOPHEN 7.5-325 MG PO TABS: 1.0000 | ORAL_TABLET | ORAL | Status: DC | PRN

## 2014-02-06 MED ORDER — MENTHOL 3 MG MT LOZG: 1.0000 | LOZENGE | OROMUCOSAL | Status: DC | PRN

## 2014-02-06 MED ORDER — ALUM & MAG HYDROXIDE-SIMETH 200-200-20 MG/5ML PO SUSP: 30.0000 mL | Freq: Four times a day (QID) | ORAL | Status: DC | PRN

## 2014-02-06 MED ORDER — SUCCINYLCHOLINE CHLORIDE 20 MG/ML IJ SOLN
INTRAMUSCULAR | Status: DC | PRN
  Administered 2014-02-06: 75 mg via INTRAVENOUS

## 2014-02-06 MED ORDER — OXYCODONE-ACETAMINOPHEN 5-325 MG PO TABS
1.0000 | ORAL_TABLET | ORAL | Status: DC | PRN
  Administered 2014-02-06 – 2014-02-08 (×10): 2 via ORAL
  Filled 2014-02-06 (×10): qty 2

## 2014-02-06 MED ORDER — GABAPENTIN 300 MG PO CAPS
300.0000 mg | ORAL_CAPSULE | Freq: Three times a day (TID) | ORAL | Status: DC
  Administered 2014-02-06 – 2014-02-08 (×6): 300 mg via ORAL
  Filled 2014-02-06 (×8): qty 1

## 2014-02-06 MED ORDER — SODIUM CHLORIDE 0.9 % IR SOLN
Status: AC
  Filled 2014-02-06: qty 1

## 2014-02-06 MED ORDER — ACETAMINOPHEN 325 MG PO TABS: 650.0000 mg | ORAL_TABLET | ORAL | Status: DC | PRN

## 2014-02-06 MED ORDER — MAGNESIUM HYDROXIDE 400 MG/5ML PO SUSP: 30.0000 mL | Freq: Every day | ORAL | Status: DC | PRN

## 2014-02-06 MED ORDER — OXYBUTYNIN CHLORIDE ER 15 MG PO TB24
15.0000 mg | ORAL_TABLET | Freq: Every day | ORAL | Status: DC
  Administered 2014-02-06 – 2014-02-08 (×3): 15 mg via ORAL
  Filled 2014-02-06 (×3): qty 1

## 2014-02-06 MED ORDER — GLYCOPYRROLATE 0.2 MG/ML IJ SOLN
INTRAMUSCULAR | Status: DC | PRN
  Administered 2014-02-06: 0.4 mg via INTRAVENOUS

## 2014-02-06 MED ORDER — MAGNESIUM CITRATE PO SOLN: 1.0000 | Freq: Once | ORAL | Status: AC | PRN

## 2014-02-06 MED ORDER — LORATADINE 10 MG PO TABS
10.0000 mg | ORAL_TABLET | Freq: Every day | ORAL | Status: DC
Start: 2014-02-07 — End: 2014-02-08
  Administered 2014-02-07 – 2014-02-08 (×2): 10 mg via ORAL
  Filled 2014-02-06 (×2): qty 1

## 2014-02-06 MED ORDER — SODIUM CHLORIDE 0.9 % IJ SOLN
3.0000 mL | Freq: Two times a day (BID) | INTRAMUSCULAR | Status: DC
  Administered 2014-02-07: 3 mL via INTRAVENOUS

## 2014-02-06 MED ORDER — NEOSTIGMINE METHYLSULFATE 10 MG/10ML IV SOLN
INTRAVENOUS | Status: DC | PRN
  Administered 2014-02-06: 3 mg via INTRAVENOUS

## 2014-02-06 MED ORDER — DEXAMETHASONE SODIUM PHOSPHATE 10 MG/ML IJ SOLN
INTRAMUSCULAR | Status: DC | PRN
  Administered 2014-02-06: 10 mg via INTRAVENOUS

## 2014-02-06 MED ORDER — DEXTROSE 5 % IV SOLN
500.0000 mg | Freq: Four times a day (QID) | INTRAVENOUS | Status: DC | PRN
  Administered 2014-02-06: 500 mg via INTRAVENOUS
  Filled 2014-02-06 (×2): qty 5

## 2014-02-06 MED ORDER — METHOCARBAMOL 500 MG PO TABS
500.0000 mg | ORAL_TABLET | Freq: Four times a day (QID) | ORAL | Status: DC | PRN
  Administered 2014-02-06 – 2014-02-08 (×2): 500 mg via ORAL
  Filled 2014-02-06 (×2): qty 1

## 2014-02-06 MED ORDER — HYDROMORPHONE HCL 1 MG/ML IJ SOLN
0.2500 mg | INTRAMUSCULAR | Status: DC | PRN
  Administered 2014-02-06 (×2): 0.25 mg via INTRAVENOUS

## 2014-02-06 MED ORDER — POLYETHYLENE GLYCOL 3350 17 G PO PACK
17.0000 g | PACK | Freq: Every day | ORAL | Status: DC
  Administered 2014-02-07: 17 g via ORAL

## 2014-02-06 MED ORDER — MEPERIDINE HCL 50 MG/ML IJ SOLN: 6.2500 mg | INTRAMUSCULAR | Status: DC | PRN

## 2014-02-06 MED ORDER — THROMBIN 5000 UNITS EX SOLR
OROMUCOSAL | Status: DC | PRN
  Administered 2014-02-06: 10 mL via TOPICAL

## 2014-02-06 MED ORDER — THROMBIN 5000 UNITS EX SOLR
CUTANEOUS | Status: AC
  Filled 2014-02-06: qty 10000

## 2014-02-06 MED ORDER — BUPIVACAINE-EPINEPHRINE 0.5% -1:200000 IJ SOLN
INTRAMUSCULAR | Status: DC | PRN
  Administered 2014-02-06: 7 mL

## 2014-02-06 MED ORDER — CISATRACURIUM BESYLATE (PF) 10 MG/5ML IV SOLN
INTRAVENOUS | Status: DC | PRN
  Administered 2014-02-06: 6 mg via INTRAVENOUS

## 2014-02-06 MED ORDER — TOPIRAMATE 25 MG PO TABS
75.0000 mg | ORAL_TABLET | Freq: Every day | ORAL | Status: DC
  Administered 2014-02-06 – 2014-02-07 (×2): 75 mg via ORAL
  Filled 2014-02-06 (×3): qty 3

## 2014-02-06 MED ORDER — EPHEDRINE SULFATE 50 MG/ML IJ SOLN
INTRAMUSCULAR | Status: DC | PRN
  Administered 2014-02-06 (×3): 10 mg via INTRAVENOUS

## 2014-02-06 MED ORDER — CEFAZOLIN SODIUM-DEXTROSE 2-3 GM-% IV SOLR
2.0000 g | INTRAVENOUS | Status: AC
  Administered 2014-02-06: 2 g via INTRAVENOUS

## 2014-02-06 MED ORDER — VITAMIN C 500 MG PO TABS
500.0000 mg | ORAL_TABLET | Freq: Every day | ORAL | Status: DC
  Administered 2014-02-07 – 2014-02-08 (×2): 500 mg via ORAL
  Filled 2014-02-06 (×2): qty 1

## 2014-02-06 MED ORDER — DOCUSATE SODIUM 100 MG PO CAPS
100.0000 mg | ORAL_CAPSULE | Freq: Two times a day (BID) | ORAL | Status: DC
  Administered 2014-02-06 – 2014-02-08 (×4): 100 mg via ORAL

## 2014-02-06 MED ORDER — TRAZODONE HCL 50 MG PO TABS
50.0000 mg | ORAL_TABLET | Freq: Every day | ORAL | Status: DC
  Administered 2014-02-06 – 2014-02-07 (×2): 50 mg via ORAL
  Filled 2014-02-06 (×3): qty 1

## 2014-02-06 MED ORDER — DOCUSATE SODIUM 100 MG PO CAPS: 100.0000 mg | ORAL_CAPSULE | Freq: Two times a day (BID) | ORAL | Status: DC | PRN

## 2014-02-06 MED ORDER — SUFENTANIL CITRATE 50 MCG/ML IV SOLN
INTRAVENOUS | Status: DC | PRN
  Administered 2014-02-06: 5 ug via INTRAVENOUS
  Administered 2014-02-06 (×2): 10 ug via INTRAVENOUS

## 2014-02-06 MED ORDER — SORBITOL 70 % SOLN: 30.0000 mL | Freq: Every day | Status: DC | PRN

## 2014-02-06 MED ORDER — OMEGA-3 FATTY ACIDS 1000 MG PO CAPS: 1.0000 g | ORAL_CAPSULE | Freq: Every day | ORAL | Status: DC

## 2014-02-06 MED ORDER — PHENOL 1.4 % MT LIQD: 1.0000 | OROMUCOSAL | Status: DC | PRN

## 2014-02-06 MED ORDER — PROMETHAZINE HCL 25 MG/ML IJ SOLN: 6.2500 mg | INTRAMUSCULAR | Status: DC | PRN

## 2014-02-06 MED ORDER — ONDANSETRON HCL 4 MG/2ML IJ SOLN: 4.0000 mg | INTRAMUSCULAR | Status: DC | PRN

## 2014-02-06 MED ORDER — LIDOCAINE HCL (CARDIAC) 20 MG/ML IV SOLN
INTRAVENOUS | Status: DC | PRN
  Administered 2014-02-06: 100 mg via INTRAVENOUS

## 2014-02-06 MED ORDER — SODIUM CHLORIDE 0.9 % IV SOLN: 250.0000 mL | INTRAVENOUS | Status: DC

## 2014-02-06 MED ORDER — SCOPOLAMINE 1 MG/3DAYS TD PT72
MEDICATED_PATCH | TRANSDERMAL | Status: DC | PRN
  Administered 2014-02-06: 1 via TRANSDERMAL

## 2014-02-06 MED ORDER — METHOCARBAMOL 500 MG PO TABS: 500.0000 mg | ORAL_TABLET | Freq: Three times a day (TID) | ORAL | Status: DC

## 2014-02-06 MED ORDER — MIDAZOLAM HCL 5 MG/5ML IJ SOLN
INTRAMUSCULAR | Status: DC | PRN
  Administered 2014-02-06: 2 mg via INTRAVENOUS

## 2014-02-06 MED ORDER — BUPIVACAINE-EPINEPHRINE (PF) 0.5% -1:200000 IJ SOLN
INTRAMUSCULAR | Status: AC
  Filled 2014-02-06: qty 30

## 2014-02-06 MED ORDER — HYDROCODONE-ACETAMINOPHEN 5-325 MG PO TABS: 1.0000 | ORAL_TABLET | ORAL | Status: DC | PRN

## 2014-02-06 MED ORDER — SODIUM CHLORIDE 0.9 % IJ SOLN: 3.0000 mL | INTRAMUSCULAR | Status: DC | PRN

## 2014-02-06 SURGICAL SUPPLY — 43 items
CLEANER TIP ELECTROSURG 2X2 (MISCELLANEOUS) ×2 IMPLANT
CLOTH 2% CHLOROHEXIDINE 3PK (PERSONAL CARE ITEMS) ×2 IMPLANT
DRAPE MICROSCOPE LEICA (MISCELLANEOUS) ×2 IMPLANT
DRAPE POUCH INSTRU U-SHP 10X18 (DRAPES) ×2 IMPLANT
DRAPE SURG 17X11 SM STRL (DRAPES) ×2 IMPLANT
DRAPE UTILITY XL STRL (DRAPES) ×2 IMPLANT
DRSG AQUACEL AG ADV 3.5X 6 (GAUZE/BANDAGES/DRESSINGS) ×4 IMPLANT
DURAPREP 26ML APPLICATOR (WOUND CARE) ×2 IMPLANT
ELECT BLADE TIP CTD 4 INCH (ELECTRODE) ×2 IMPLANT
ELECT REM PT RETURN 9FT ADLT (ELECTROSURGICAL) ×2
ELECTRODE REM PT RTRN 9FT ADLT (ELECTROSURGICAL) ×1 IMPLANT
GLOVE BIOGEL PI IND STRL 7.5 (GLOVE) ×1 IMPLANT
GLOVE BIOGEL PI INDICATOR 7.5 (GLOVE) ×1
GLOVE SURG SS PI 7.5 STRL IVOR (GLOVE) ×2 IMPLANT
GLOVE SURG SS PI 8.0 STRL IVOR (GLOVE) ×4 IMPLANT
GOWN STRL REUS W/TWL XL LVL3 (GOWN DISPOSABLE) ×6 IMPLANT
IV CATH 14GX2 1/4 (CATHETERS) ×2 IMPLANT
KIT BASIN OR (CUSTOM PROCEDURE TRAY) ×2 IMPLANT
KIT POSITIONING SURG ANDREWS (MISCELLANEOUS) ×2 IMPLANT
MANIFOLD NEPTUNE II (INSTRUMENTS) ×2 IMPLANT
NDL SPNL 18GX3.5 QUINCKE PK (NEEDLE) ×2 IMPLANT
NEEDLE SPNL 18GX3.5 QUINCKE PK (NEEDLE) ×4 IMPLANT
PACK LAMINECTOMY ORTHO (CUSTOM PROCEDURE TRAY) ×2 IMPLANT
PATTIES SURGICAL .75X.75 (GAUZE/BANDAGES/DRESSINGS) ×1 IMPLANT
SPONGE SURGIFOAM ABS GEL 100 (HEMOSTASIS) ×2 IMPLANT
STAPLER VISISTAT (STAPLE) ×1 IMPLANT
SUT PROLENE 3 0 PS 2 (SUTURE) ×1 IMPLANT
SUT VIC AB 0 CT1 27 (SUTURE)
SUT VIC AB 0 CT1 27XBRD ANTBC (SUTURE) IMPLANT
SUT VIC AB 1 CT1 27 (SUTURE) ×2
SUT VIC AB 1 CT1 27XBRD ANTBC (SUTURE) IMPLANT
SUT VIC AB 1-0 CT2 27 (SUTURE) ×3 IMPLANT
SUT VIC AB 2-0 CT1 27 (SUTURE)
SUT VIC AB 2-0 CT1 TAPERPNT 27 (SUTURE) ×1 IMPLANT
SUT VIC AB 2-0 CT2 27 (SUTURE) ×3 IMPLANT
SUT VICRYL 0 27 CT2 27 ABS (SUTURE) ×1 IMPLANT
SUT VICRYL 0 UR6 27IN ABS (SUTURE) IMPLANT
SYR 3ML LL SCALE MARK (SYRINGE) ×2 IMPLANT
SYRINGE 10CC LL (SYRINGE) ×1 IMPLANT
TOWEL OR 17X26 10 PK STRL BLUE (TOWEL DISPOSABLE) ×3 IMPLANT
TOWEL OR NON WOVEN STRL DISP B (DISPOSABLE) ×1 IMPLANT
TRAY FOLEY CATH 14FRSI W/METER (CATHETERS) ×1 IMPLANT
YANKAUER SUCT BULB TIP NO VENT (SUCTIONS) ×2 IMPLANT

## 2014-02-06 NOTE — Plan of Care (Signed)
Problem: Consults Goal: Diagnosis - Spinal Surgery Outcome: Completed/Met Date Met:  02/06/14 Lumbar Laminectomy (Complex)

## 2014-02-06 NOTE — Interval H&P Note (Signed)
History and Physical Interval Note:  02/06/2014 8:20 AM  [name]  has presented today for surgery, with the diagnosis of HERNIATED DISC  The various methods of treatment have been discussed with the patient and family. After consideration of risks, benefits and other options for treatment, the patient has consented to  Procedure(s): REDO MICRODISCECTOMY LUMBAR DECOMRPESSION OF L4-L5 RIGHT  (Right) as a surgical intervention .  The patient's history has been reviewed, patient examined, no change in status, stable for surgery.  I have reviewed the patient's chart and labs.  Questions were answered to the patient's satisfaction.     [name]

## 2014-02-06 NOTE — Discharge Instructions (Signed)

## 2014-02-06 NOTE — Transfer of Care (Signed)
Immediate Anesthesia Transfer of Care Note  Patient: [name]  Procedure(s) Performed: Procedure(s): LUMBAR DECOMPRESSION L3-L4, REDO LUMBAR DECOMPRESSION L4-L5 ON THE RIGHT (Right)  Patient Location: PACU  Anesthesia Type:General  Level of Consciousness: awake, alert  and oriented  Airway & Oxygen Therapy: Patient Spontanous Breathing and Patient connected to face mask oxygen  Post-op Assessment: Report given to PACU RN and Post -op Vital signs reviewed and stable  Post vital signs: Reviewed and stable  Complications: No apparent anesthesia complications

## 2014-02-06 NOTE — Anesthesia Procedure Notes (Signed)
Procedure Name: Intubation Date/Time: 02/06/2014 8:31 AM Performed by: Danley Danker L Patient Re-evaluated:Patient Re-evaluated prior to inductionOxygen Delivery Method: Circle system utilized Preoxygenation: Pre-oxygenation with 100% oxygen Intubation Type: IV induction Ventilation: Mask ventilation without difficulty Laryngoscope Size: Miller and 2 Grade View: Grade I Tube type: Oral Tube size: 7.5 mm Number of attempts: 1 Airway Equipment and Method: Stylet Placement Confirmation: ETT inserted through vocal cords under direct vision,  breath sounds checked- equal and bilateral and positive ETCO2 Secured at: 19 cm Tube secured with: Tape Dental Injury: Teeth and Oropharynx as per pre-operative assessment

## 2014-02-06 NOTE — Brief Op Note (Signed)
02/06/2014  9:52 AM  PATIENT:  [name]  64 y.o. 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  PROCEDURE:  Procedure(s): LUMBAR DECOMPRESSION L3-L4, REDO LUMBAR DECOMPRESSION L4-L5 ON THE RIGHT (Right)  SURGEON:  Surgeon(s) and Role:    * Johnn Hai, MD - Primary  PHYSICIAN ASSISTANT:   ASSISTANTS: [place]   ANESTHESIA:   general  EBL:  Total I/O In: 1000 [I.V.:1000] Out: 175 [Urine:125; Blood:50]  BLOOD ADMINISTERED:none  DRAINS: none   LOCAL MEDICATIONS USED:  MARCAINE     SPECIMEN:  No Specimen  DISPOSITION OF SPECIMEN:  N/A  COUNTS:  YES  TOURNIQUET:  * No tourniquets in log *  DICTATION: .Other Dictation: Dictation Number [id number]  PLAN OF CARE: Admit for overnight observation  PATIENT DISPOSITION:  PACU - hemodynamically stable.   Delay start of Pharmacological VTE agent (>24hrs) due to surgical blood loss or risk of bleeding: yes

## 2014-02-06 NOTE — H&P (Signed)
[name] is an 64 y.o. female.   Chief Complaint: Right leg pain HPI: Hx decompression fusion L5S1 decompression L45 recurrent stenosis.  Past Medical History  Diagnosis Date  . Seasonal allergies   . Anxiety 2000  . Depression 2000  . GERD (gastroesophageal reflux disease)   . Headache(784.0)   . Anemia   . PONV (postoperative nausea and vomiting)     Past Surgical History  Procedure Laterality Date  . Abdominal hysterectomy  1984  . Cholecystectomy  2003  . Neck surgery  2001,2007    herniated disc- cervical fusion(limited side to side mobility)  . Ercp w/ sphincterotomy and balloon dilation  2005    pancreatitis   . Back surgery  2006, 2008    lower back, fusion in l5-s1  . Shoulder arthroscopy Bilateral 2010, 2011    debridement  . Lumbar laminectomy/decompression microdiscectomy N/A 05/31/2012    Procedure: MICRO LUMBAR DECOMPRESSION L4 - L5, L5 - S1 ;  Surgeon: Javier Docker, MD;  Location: [facility] ORS;  Service: Orthopedics;  Laterality: N/A;    History reviewed. No pertinent family history. Social History:  reports that she quit smoking about 42 years ago. Her smoking use included Cigarettes. She has a 1.5 pack-year smoking history. She has never used smokeless tobacco. She reports that she does not drink alcohol or use illicit drugs.  Allergies: No Active Allergies  Medications Prior to Admission  Medication Sig Dispense Refill  . ascorbic acid (VITAMIN C) 1000 MG tablet Take 1,000 mg by mouth daily.    . Calcium Carb-Cholecalciferol (CALCIUM 600 + D PO) Take 1 tablet by mouth daily.    . cholecalciferol (VITAMIN D) 1000 UNITS tablet Take 1,000 Units by mouth daily.    Marland Kitchen estrogens, conjugated, (PREMARIN) 0.45 MG tablet Take 0.45 mg by mouth every morning.    . fexofenadine (ALLEGRA) 180 MG tablet Take 180 mg by mouth daily.    . fish oil-omega-3 fatty acids 1000 MG capsule Take 1 g by mouth daily.     Marland Kitchen gabapentin (NEURONTIN) 300 MG capsule Take 300 mg by mouth 3  (three) times daily.    Marland Kitchen HYDROcodone-acetaminophen (NORCO) 10-325 MG per tablet Take 1 tablet by mouth 4 (four) times daily as needed for moderate pain.    . iron polysaccharides (NIFEREX) 150 MG capsule Take 150 mg by mouth daily.    Marland Kitchen lidocaine (LIDODERM) 5 % Place 1 patch onto the skin daily. Remove & Discard patch within 12 hours or as directed by MD    . methocarbamol (ROBAXIN) 500 MG tablet Take 1 tablet (500 mg total) by mouth 3 (three) times daily. (Patient taking differently: Take 500 mg by mouth every 6 (six) hours as needed for muscle spasms. ) 40 tablet 1  . mupirocin nasal ointment (BACTROBAN) 2 % Place 1 application into the nose 2 (two) times daily. Use one-half of tube in each nostril twice daily for five (5) days. After application, press sides of nose together and gently massage.    . ondansetron (ZOFRAN) 4 MG tablet Take 4 mg by mouth every 8 (eight) hours as needed for nausea or vomiting.    Marland Kitchen oxybutynin (DITROPAN XL) 15 MG 24 hr tablet Take 15 mg by mouth daily.    . pantoprazole (PROTONIX) 40 MG tablet Take 40 mg by mouth daily.    . polyethylene glycol (MIRALAX / GLYCOLAX) packet Take 17 g by mouth daily.    Marland Kitchen POTASSIUM GLUCONATE PO Take 1 tablet by  mouth daily.    Marland Kitchen topiramate (TOPAMAX) 25 MG tablet Take 75 mg by mouth at bedtime.    . traZODone (DESYREL) 50 MG tablet Take 50 mg by mouth at bedtime.    Marland Kitchen oxyCODONE-acetaminophen (PERCOCET) 7.5-325 MG per tablet Take 1-2 tablets by mouth every 4 (four) hours as needed for pain. (Patient not taking: Reported on 02/05/2014) 60 tablet 0    Results for orders placed or performed during the hospital encounter of 02/05/14 (from the past 48 hour(s))  Surgical pcr screen     Status: Abnormal   Collection Time: 02/05/14 10:45 AM  Result Value Ref Range   MRSA, PCR NEGATIVE NEGATIVE   Staphylococcus aureus POSITIVE (A) NEGATIVE    Comment:        The Xpert SA Assay (FDA approved for NASAL specimens in patients over 21 years of  age), is one component of a comprehensive surveillance program.  Test performance has been validated by [employer] for patients greater than or equal to 1 year old. It is not intended to diagnose infection nor to guide or monitor treatment.   Basic metabolic panel     Status: Abnormal   Collection Time: 02/05/14 11:30 AM  Result Value Ref Range   Sodium 139 135 - 145 mmol/L    Comment: Please note change in reference range.   Potassium 5.0 3.5 - 5.1 mmol/L    Comment: Please note change in reference range.   Chloride 103 96 - 112 mEq/L   CO2 28 19 - 32 mmol/L   Glucose, Bld 95 70 - 99 mg/dL   BUN 17 6 - 23 mg/dL   Creatinine, Ser 0.81 0.50 - 1.10 mg/dL   Calcium 9.6 8.4 - 10.5 mg/dL   GFR calc non Af Amer 77 (L) >90 mL/min   GFR calc Af Amer 89 (L) >90 mL/min    Comment: (NOTE) The eGFR has been calculated using the CKD EPI equation. This calculation has not been validated in all clinical situations. eGFR's persistently <90 mL/min signify possible Chronic Kidney Disease.    Anion gap 8 5 - 15  CBC     Status: Abnormal   Collection Time: 02/05/14 11:30 AM  Result Value Ref Range   WBC 8.8 4.0 - 10.5 K/uL   RBC 4.10 3.87 - 5.11 MIL/uL   Hemoglobin 13.3 12.0 - 15.0 g/dL   HCT 41.6 36.0 - 46.0 %   MCV 101.5 (H) 78.0 - 100.0 fL   MCH 32.4 26.0 - 34.0 pg   MCHC 32.0 30.0 - 36.0 g/dL   RDW 12.7 11.5 - 15.5 %   Platelets 343 150 - 400 K/uL   Dg Lumbar Spine 2-3 Views  02/05/2014   CLINICAL DATA:  Lumbar decompression.  EXAM: LUMBAR SPINE - 2-3 VIEW  COMPARISON:  05/31/2012.  FINDINGS: Patient has had prior L5-S1 posterior interbody fusion. Prior laminectomy at this level. Normal alignment to No acute bony abnormality. Surgical clips right upper quadrant.  IMPRESSION: Prior L5-S1 posterior/ interbody fusion and laminectomy.   Electronically Signed   By: Marcello Moores  Register   On: 02/05/2014 14:19    Review of Systems  Musculoskeletal: Positive for back pain.  Neurological:  Positive for tingling and focal weakness.  All other systems reviewed and are negative.   Blood pressure 131/68, pulse 73, temperature 97.5 F (36.4 C), temperature source Oral, resp. rate 18, height $RemoveBe'5\' 5"'DepJzfTJS$  (1.651 m), weight 67.586 kg (149 lb), SpO2 100 %. Physical Exam  Constitutional: She is oriented  to person, place, and time. She appears well-developed.  HENT:  Head: Normocephalic.  Eyes: Pupils are equal, round, and reactive to light.  Neck: Normal range of motion.  Cardiovascular: Normal rate.   Respiratory: Effort normal.  GI: Soft.  Musculoskeletal:  +SLR right. EHL 4+/5 Decreased sensation L5. No DVT. 2+ pulses  Neurological: She is alert and oriented to person, place, and time.  Skin: Skin is warm and dry.  Psychiatric: She has a normal mood and affect.    CT myelogram stenosis L45 right.  Assessment/Plan Stenosis L45 recurrent. Hx fusion L5 S1 Refractory. Plan decompression L45. Risks discussed.  Aerie Donica C 02/06/2014, 8:15 AM

## 2014-02-06 NOTE — Anesthesia Preprocedure Evaluation (Signed)
Anesthesia Evaluation  Patient identified by MRN, date of birth, ID band Patient awake    Reviewed: Allergy & Precautions, H&P , NPO status , Patient's Chart, lab work & pertinent test results  History of Anesthesia Complications (+) PONV  Airway Mallampati: II  TM Distance: >3 FB Neck ROM: Full    Dental no notable dental hx.    Pulmonary neg pulmonary ROS, former smoker,  breath sounds clear to auscultation  Pulmonary exam normal       Cardiovascular negative cardio ROS  Rhythm:Regular Rate:Normal     Neuro/Psych negative neurological ROS  negative psych ROS   GI/Hepatic negative GI ROS, Neg liver ROS,   Endo/Other  negative endocrine ROS  Renal/GU negative Renal ROS  negative genitourinary   Musculoskeletal negative musculoskeletal ROS (+)   Abdominal   Peds negative pediatric ROS (+)  Hematology negative hematology ROS (+)   Anesthesia Other Findings   Reproductive/Obstetrics negative OB ROS                             Anesthesia Physical  Anesthesia Plan  ASA: II  Anesthesia Plan: General   Post-op Pain Management:    Induction: Intravenous  Airway Management Planned: Oral ETT  Additional Equipment:   Intra-op Plan:   Post-operative Plan: Extubation in OR  Informed Consent: I have reviewed the patients History and Physical, chart, labs and discussed the procedure including the risks, benefits and alternatives for the proposed anesthesia with the patient or authorized representative who has indicated his/her understanding and acceptance.   Dental advisory given  Plan Discussed with: CRNA and Surgeon  Anesthesia Plan Comments:         Anesthesia Quick Evaluation

## 2014-02-06 NOTE — Anesthesia Postprocedure Evaluation (Signed)
  Anesthesia Post-op Note  Patient: [name]  Procedure(s) Performed: Procedure(s) (LRB): LUMBAR DECOMPRESSION L3-L4, REDO LUMBAR DECOMPRESSION L4-L5 ON THE RIGHT (Right)  Patient Location: PACU  Anesthesia Type: General  Level of Consciousness: awake and alert   Airway and Oxygen Therapy: Patient Spontanous Breathing  Post-op Pain: mild  Post-op Assessment: Post-op Vital signs reviewed, Patient's Cardiovascular Status Stable, Respiratory Function Stable, Patent Airway and No signs of Nausea or vomiting  Last Vitals:  Filed Vitals:   02/06/14 1228  BP: 109/52  Pulse: 81  Temp: 36.6 C  Resp: 18    Post-op Vital Signs: stable   Complications: No apparent anesthesia complications

## 2014-02-07 ENCOUNTER — Encounter (HOSPITAL_COMMUNITY): Payer: Self-pay | Admitting: Specialist

## 2014-02-07 DIAGNOSIS — M4806 Spinal stenosis, lumbar region: Secondary | ICD-10-CM | POA: Diagnosis not present

## 2014-02-07 LAB — BASIC METABOLIC PANEL
Anion gap: 6 (ref 5–15)
BUN: 10 mg/dL (ref 6–23)
CHLORIDE: 106 meq/L (ref 96–112)
CO2: 30 mmol/L (ref 19–32)
CREATININE: 0.82 mg/dL (ref 0.50–1.10)
Calcium: 9 mg/dL (ref 8.4–10.5)
GFR calc non Af Amer: 76 mL/min — ABNORMAL LOW (ref >90)
GFR, EST AFRICAN AMERICAN: 88 mL/min — AB (ref >90)
Glucose, Bld: 100 mg/dL — ABNORMAL HIGH (ref 70–99)
POTASSIUM: 4.6 mmol/L (ref 3.5–5.1)
SODIUM: 142 mmol/L (ref 135–145)

## 2014-02-07 LAB — CBC
HCT: 34.6 % — ABNORMAL LOW (ref 36.0–46.0)
Hemoglobin: 10.7 g/dL — ABNORMAL LOW (ref 12.0–15.0)
MCH: 32.1 pg (ref 26.0–34.0)
MCHC: 30.9 g/dL (ref 30.0–36.0)
MCV: 103.9 fL — ABNORMAL HIGH (ref 78.0–100.0)
Platelets: 274 10*3/uL (ref 150–400)
RBC: 3.33 MIL/uL — ABNORMAL LOW (ref 3.87–5.11)
RDW: 13.2 % (ref 11.5–15.5)
WBC: 12.7 10*3/uL — ABNORMAL HIGH (ref 4.0–10.5)

## 2014-02-07 NOTE — Evaluation (Signed)
Occupational Therapy Evaluation Patient Details Name: [name] MRN: [id number] DOB: 1950/03/06 Today's Date: 02/07/2014    History of Present Illness pt was admitted for redo L4-L5 decompression for spinal stenosis.  Previous surgeries were in '06 and '08   Clinical Impression   This 64 year old female was admitted for the above surgery.  All education was completed.  No further OT is needed at this time.  Pt will benefit from the DME/AE below to promote safety following back precautions and independence with adls.      Follow Up Recommendations  Supervision/Assistance - 24 hour;No OT follow up    Equipment Recommendations  3 in 1 bedside comode;Tub/shower seat (with back; AE kit; toilet aide)    Recommendations for Other Services       Precautions / Restrictions Precautions Precautions: Back Restrictions Weight Bearing Restrictions: No      Mobility Bed Mobility               General bed mobility comments: pt was sitting EOB with NT:  verbalizes steps for bed mobility  Transfers Overall transfer level: Needs assistance Equipment used: None Transfers: Sit to/from Stand Sit to Stand: Min guard         General transfer comment: cues for back precautions    Balance                                            ADL Overall ADL's : Needs assistance/impaired     Grooming: Supervision/safety;Wash/dry hands;Standing   Upper Body Bathing: Set up;Sitting   Lower Body Bathing: With adaptive equipment;Sit to/from stand;Min guard   Upper Body Dressing : Minimal assistance;Sitting (iv)   Lower Body Dressing: Min guard;Sit to/from stand;With adaptive equipment   Toilet Transfer: Min guard;Ambulation;BSC   Toileting- Water quality scientist and Hygiene: Sit to/from stand;With adaptive equipment;Minimal assistance         General ADL Comments: Pt educated on AE and practiced with sock aide and reacher.  Educated on sidestepping over tub:   would benefit from tub seat, 3:1 and AE kit as well as toilet aide.  Reviewed precautions and ADL applications     Vision                     Perception     Praxis      Pertinent Vitals/Pain Pain Assessment: 0-10 Pain Score: 5  Pain Location: back Pain Descriptors / Indicators: Sore Pain Intervention(s): Limited activity within patient's tolerance;Monitored during session;Repositioned;Premedicated before session     Hand Dominance     Extremity/Trunk Assessment Upper Extremity Assessment Upper Extremity Assessment: Overall WFL for tasks assessed           Communication Communication Communication: No difficulties   Cognition Arousal/Alertness: Awake/alert Behavior During Therapy: WFL for tasks assessed/performed Overall Cognitive Status: Within Functional Limits for tasks assessed                     General Comments       Exercises       Shoulder Instructions      Home Living Family/patient expects to be discharged to:: Private residence Living Arrangements: Alone                 Bathroom Shower/Tub: Tub/shower unit Shower/tub characteristics: Architectural technologist: Standard         Additional  Comments: will stay at boyfriend's for several weeks.  He will be off for 2 weeks      Prior Functioning/Environment Level of Independence: Independent             OT Diagnosis: Acute pain   OT Problem List:     OT Treatment/Interventions:      OT Goals(Current goals can be found in the care plan section)    OT Frequency:     Barriers to D/C:            Co-evaluation              End of Session    Activity Tolerance: Patient tolerated treatment well Patient left: in chair;with bed alarm set   Time: 0812-0838 OT Time Calculation (min): 26 min Charges:  OT General Charges $OT Visit: 1 Procedure OT Evaluation $Initial OT Evaluation Tier I: 1 Procedure OT Treatments $Self Care/Home Management : 8-22  mins G-Codes: OT G-codes **NOT FOR INPATIENT CLASS** Functional Assessment Tool Used: clinical observation Functional Limitation: Self care Self Care Current Status (V4944): At least 1 percent but less than 20 percent impaired, limited or restricted Self Care Goal Status (H6759): At least 1 percent but less than 20 percent impaired, limited or restricted Self Care Discharge Status [phone]): At least 1 percent but less than 20 percent impaired, limited or restricted  [facility] 02/07/2014, 8:56 AM  Lesle Chris, OTR/L [phone] 02/07/2014

## 2014-02-07 NOTE — Evaluation (Signed)
Physical Therapy Evaluation Patient Details Name: [name] MRN: [id number] DOB: [birth year] Today's Date: 02/07/2014   History of Present Illness  pt was admitted for redo L4-L5 decompression for spinal stenosis.  Previous surgeries were in '06 and '08  Clinical Impression  Pt s/o lumbar decompression presents with functional mobility limitations 2* post op pain and back precautions.  Pt mobilizing at Sup level and plans d.c home with assist of boyfriend.    Follow Up Recommendations No PT follow up    Equipment Recommendations  None recommended by PT    Recommendations for Other Services OT consult     Precautions / Restrictions Precautions Precautions: Back Precaution Booklet Issued: Yes (comment) Precaution Comments: ALl precautions reviewed Restrictions Weight Bearing Restrictions: No      Mobility  Bed Mobility               General bed mobility comments: pt up in chair,.  Pt verbalizes technique for in/out bed.  Pt declines to attempt  Transfers Overall transfer level: Needs assistance Equipment used: None Transfers: Sit to/from Stand Sit to Stand: Supervision         General transfer comment: min cues for back precautions  Ambulation/Gait Ambulation/Gait assistance: Min guard;Supervision Ambulation Distance (Feet): 350 Feet Assistive device: Rolling walker (2 wheeled) Gait Pattern/deviations: Step-through pattern;Decreased step length - right;Decreased step length - left;Shuffle Gait velocity: decr Gait velocity interpretation: Below normal speed for age/gender General Gait Details: min cues for position from RW  Stairs Stairs:  (Reviewed verbally - pt declines to practice)          Wheelchair Mobility    Modified Rankin (Stroke Patients Only)       Balance                                             Pertinent Vitals/Pain Pain Assessment: 0-10 Pain Score: 4  Pain Location: back - no LE pain at this  time Pain Descriptors / Indicators: Sore Pain Intervention(s): Limited activity within patient's tolerance;Premedicated before session;Monitored during session    Home Living Family/patient expects to be discharged to:: Private residence Living Arrangements: Alone Available Help at Discharge: Family Type of Home: House Home Access: Stairs to enter Entrance Stairs-Rails: Left Entrance Stairs-Number of Steps: 1 Home Layout: One level Home Equipment: Walker - 2 wheels Additional Comments: will stay at boyfriend's for several weeks.  He will be off for 2 weeks    Prior Function Level of Independence: Independent               Hand Dominance        Extremity/Trunk Assessment   Upper Extremity Assessment: Overall WFL for tasks assessed           Lower Extremity Assessment: Overall WFL for tasks assessed      Cervical / Trunk Assessment: Normal  Communication   Communication: No difficulties  Cognition Arousal/Alertness: Awake/alert Behavior During Therapy: WFL for tasks assessed/performed Overall Cognitive Status: Within Functional Limits for tasks assessed                      General Comments      Exercises        Assessment/Plan    PT Assessment Patent does not need any further PT services  PT Diagnosis Difficulty walking   PT Problem List    PT  Treatment Interventions     PT Goals (Current goals can be found in the Care Plan section) Acute Rehab PT Goals Patient Stated Goal: Resume previous lifestyle with decreased pain PT Goal Formulation: All assessment and education complete, DC therapy    Frequency     Barriers to discharge        Co-evaluation               End of Session   Activity Tolerance: Patient tolerated treatment well Patient left: in chair;with call bell/phone within reach Nurse Communication: Mobility status    Functional Assessment Tool Used: clinical judgement Functional Limitation: Mobility: Walking and  moving around Mobility: Walking and Moving Around Current Status (Z3086): At least 1 percent but less than 20 percent impaired, limited or restricted Mobility: Walking and Moving Around Goal Status [phone]): At least 1 percent but less than 20 percent impaired, limited or restricted Mobility: Walking and Moving Around Discharge Status [phone]): At least 1 percent but less than 20 percent impaired, limited or restricted    Time: 0846-0903 PT Time Calculation (min) (ACUTE ONLY): 17 min   Charges:   PT Evaluation $Initial PT Evaluation Tier I: 1 Procedure PT Treatments $Gait Training: 8-22 mins   PT G Codes:   PT G-Codes **NOT FOR INPATIENT CLASS** Functional Assessment Tool Used: clinical judgement Functional Limitation: Mobility: Walking and moving around Mobility: Walking and Moving Around Current Status (M8413): At least 1 percent but less than 20 percent impaired, limited or restricted Mobility: Walking and Moving Around Goal Status [phone]): At least 1 percent but less than 20 percent impaired, limited or restricted Mobility: Walking and Moving Around Discharge Status [phone]): At least 1 percent but less than 20 percent impaired, limited or restricted    Providence Stivers 02/07/2014, 10:34 AM

## 2014-02-07 NOTE — Progress Notes (Signed)
Subjective: 1 Day Post-Op Procedure(s) (LRB): LUMBAR DECOMPRESSION L3-L4, REDO LUMBAR DECOMPRESSION L4-L5 ON THE RIGHT (Right) Patient reports pain as moderate.  Reports incisional soreness. Her leg pain is resolved. She denies other c/o. Voiding without difficulty. Has been up walking in the hallway which did cause some soreness in her back. She is not sure if she feels ready to go home at this point. Seen by myself and Dr. Tonita Cong  Objective: Vital signs in last 24 hours: Temp:  [97.4 F (36.3 C)-98.5 F (36.9 C)] 98.5 F (36.9 C) (01/07 0550) Pulse Rate:  [68-89] 87 (01/07 0550) Resp:  [10-18] 18 (01/07 0550) BP: (105-118)/(52-59) 118/55 mmHg (01/07 0550) SpO2:  [98 %-100 %] 99 % (01/07 0550)  Intake/Output from previous day: 01/06 0701 - 01/07 0700 In: 3707.5 [P.O.:1440; I.V.:2112.5; IV Piggyback:155] Out: 4250 [Urine:4200; Blood:50] Intake/Output this shift: Total I/O In: 360 [P.O.:360] Out: -    Recent Labs  02/05/14 1130 02/07/14 0453  HGB 13.3 10.7*    Recent Labs  02/05/14 1130 02/07/14 0453  WBC 8.8 12.7*  RBC 4.10 3.33*  HCT 41.6 34.6*  PLT 343 274    Recent Labs  02/05/14 1130 02/07/14 0453  NA 139 142  K 5.0 4.6  CL 103 106  CO2 28 30  BUN 17 10  CREATININE 0.81 0.82  GLUCOSE 95 100*  CALCIUM 9.6 9.0   No results for input(s): LABPT, INR in the last 72 hours.  Neurologically intact ABD soft Neurovascular intact Sensation intact distally Intact pulses distally Dorsiflexion/Plantar flexion intact Incision: dressing C/D/I and no drainage No cellulitis present Compartment soft no sign of DVT  Assessment/Plan: 1 Day Post-Op Procedure(s) (LRB): LUMBAR DECOMPRESSION L3-L4, REDO LUMBAR DECOMPRESSION L4-L5 ON THE RIGHT (Right) Advance diet Up with therapy D/C IV fluids  Discussed D/C instructions, Lspine precautions Possible D/C later today if pain improves and she feels ready- please call or text me at [phone] if she would like to  go today- otherwise will plan for D/C tomorrow morning   Yuri Flener M. 02/07/2014, 10:24 AM

## 2014-02-07 NOTE — Op Note (Signed)
[name], [name]                 ACCOUNT NO.:  [id number]  MEDICAL RECORD NO.:  [id number]  LOCATION:  9147                         FACILITY:  [facility]  PHYSICIAN:  Susa Day, M.D.    DATE OF BIRTH:  1950/02/23  DATE OF PROCEDURE:  02/06/2014 DATE OF DISCHARGE:                              OPERATIVE REPORT   PREOPERATIVE DIAGNOSIS:  Adjacent segment spinal stenosis at L4-5, right with recurrent spinal stenosis.  POSTOPERATIVE DIAGNOSIS:  Adjacent segment spinal stenosis at L4-5, right with recurrent spinal stenosis.  PROCEDURE PERFORMED:  Redo lumbar decompression L4-5 central with hemilaminectomy of L4 on the right, decompression 3-4 and redo decompression 4-5 on the right with foraminotomies of L4 and L5.  ANESTHESIA:  General.  ASSISTANT:  [name] Dunker, PA  HISTORY:  This is a 64 year old female with right lower extremity radicular pain in L5 nerve root distribution secondary to lateral recess stenosis.  She had a fusion at 5-1, disk bulge and retrolisthesis at 4-5 with facet hypertrophy compressing the 5 root dynamically in the upright position.  She had a myelogram with temporary relief from an epidural at 4-5, EHL weakness, and neural tension signs.  She was indicated for decompression at 4-5 to the pedicle or 4.  Risk and benefits were discussed including bleeding, infection, damage to neurovascular structures, DVT, PE, anesthetic complications, no changes in her symptoms, worsening of symptoms, etc.  TECHNIQUE:  With the patient in a supine position, after the induction of adequate general anesthesia, 2 g of Kefzol and 900 clindamycin, she was placed prone on the [place] frame.  All bony prominences were well padded.  Lumbar region was prepped and draped in usual sterile fashion. Two 18-gauge spinal needle was utilized to localized 4-5 interspace, confirmed with x-ray.  Previous surgical incision was utilized to excise scar.  Subcutaneous tissue was  dissected.  Electrocautery was utilized to achieve hemostasis.  Dorsolumbar fascia was divided in line with the skin incision.  We identified the remaining portion of the spinous process of 4, elevated the soft tissues bilaterally.  Identified the remaining lamina of 4, the facet of 4-5, the hardware at 5-1.  Self- retaining retractor was placed, confirmatory radiograph obtained with a marker on the spinous process of 4.  We skeletonized the previous laminotomy.  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  We meticulously developed a plane between the thecal sac and the previous laminotomy.  After mobilizing the thecal sac medially, there was facet hypertrophy noted laterally compressing the 5 root into the lateral recess.  We decompressed the lateral recess to the medial border of the pedicle further mobilizing the fibrosis, performing foraminotomies of 4 and 5. We removed the remaining spinous process of 4, continued cephalad, and performed a hemilaminectomy of 4, and removed ligamentum flavum from 3-4 to trace the 4 root out to the foramen of 4.  She had some retrolisthesis there.  Foraminotomies had been performed here.  We evaluated this, there was some disk bulging, but no rupture.  Bipolar electrocautery was utilized to achieve hemostasis.  No probe passed freely up the foramen of 4 and 5 following the decompression and we obtained confirmatory radiographs at those levels.  Inspection revealed  no evidence of CSF leakage or active bleeding.  Bone wax on the cancellous surfaces.  Thrombin-soaked Gelfoam after copious irrigation was placed in the laminotomy defect.  Good restoration of thecal sac and mobilization of the 5 root.  This is consistent with her clinical pathology.  Removed the McCullough retractors, irrigated the paraspinous musculature.  No active bleeding.  Closed the dorsal lumbar fascia with 1-Vicryl, subcu with 2-0, and skin with staples.  Wound dressed sterilely.  Placed supine on  the hospital bed, extubated without difficulty, transported to the recovery room in a satisfactory condition.  The patient tolerated the procedure well.  No complications.  Assistant, [name] Dunker, PA was required throughout the case for patient positioning, gentle intermittent neural traction, and closure.  Minimal blood loss.     Susa Day, M.D.     [name]  D:  02/06/2014  T:  02/07/2014  Job:  [id number]

## 2014-02-07 NOTE — Care Management Note (Signed)
    Page 1 of 1   02/07/2014     2:19:12 PM CARE MANAGEMENT NOTE 02/07/2014  Patient:  [name], [name]   Account Number:  [id number]  Date Initiated:  02/07/2014  Documentation initiated by:  Sunday Spillers  Subjective/Objective Assessment:   64 yo female admitted s/p lumbar decompression L4-5. PTA lived at home with alone.     Action/Plan:   Home when stable   Anticipated DC Date:  02/07/2014   Anticipated DC Plan:  [place]  CM consult      Choice offered to / List presented to:     DME arranged  3-N-1  SHOWER STOOL           Status of service:  Completed, signed off Medicare Important Message given?   (If response is "NO", the following Medicare IM given date fields will be blank) Date Medicare IM given:   Medicare IM given by:   Date Additional Medicare IM given:   Additional Medicare IM given by:    Discharge Disposition:  HOME/SELF CARE  Per UR Regulation:  Reviewed for med. necessity/level of care/duration of stay  If discussed at [place] of Stay Meetings, dates discussed:    Comments:  02/07/13 14:00 Cm met with pt in room to discuss DME needs (no PT/OT recc.).  Pt has a rolling walker at home but needs shower/tub seat and 3n1.  Cm called [phone] and spoke to [place] who verified pt's subscriber number and asked I please fax orders, facesheet with correct address, H&P, OP note and PT/OT evals to the [facility] at [phone] fax: [phone].  Cm faxed the requested information with address correction [place] [address].  DME is to be delivered to the aforementioned address of [address] other CM needs were communicated.  Mariane Masters, BSN, CM [phone].

## 2014-02-07 NOTE — Progress Notes (Signed)
CSW consulted for SNF placement. PN reviewed. Pt plans to return home following hospital d/c. PT reports no follow up needed. CSW signing off.  Werner Lean LCSW [phone]

## 2014-02-08 DIAGNOSIS — M4806 Spinal stenosis, lumbar region: Secondary | ICD-10-CM | POA: Diagnosis not present

## 2014-02-08 DIAGNOSIS — N3946 Mixed incontinence: Secondary | ICD-10-CM | POA: Insufficient documentation

## 2014-02-08 NOTE — Discharge Summary (Signed)
Physician Discharge Summary   Patient ID: [name] MRN: [id number] DOB/AGE: 62-Mar-1952 62 y.o.  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 Seasonal allergies   . Anxiety 2000  . Depression 2000  . GERD (gastroesophageal reflux disease)   . Headache(784.0)   . Anemia   . PONV (postoperative nausea and vomiting)    Discharge Diagnoses:   Active Problems:   Spinal stenosis at L4-L5 level  Procedure:  Procedure(s) (LRB): LUMBAR DECOMPRESSION L3-L4, REDO LUMBAR DECOMPRESSION L4-L5 ON THE RIGHT (Right)   Consults: none  HPI:  see H&P    Laboratory Data: Hospital Outpatient Visit on 02/05/2014  Component Date Value Ref Range Status  . MRSA, PCR 02/05/2014 NEGATIVE  NEGATIVE Final  . Staphylococcus aureus 02/05/2014 POSITIVE* NEGATIVE Final   Comment:        The Xpert SA Assay (FDA approved for NASAL specimens in patients over 54 years of age), is one component of a comprehensive surveillance program.  Test performance has been validated by [employer] for patients greater than or equal to 10 year old. It is not intended to diagnose infection nor to guide or monitor treatment.   . Sodium 02/05/2014 139  135 - 145 mmol/L Final   Please note change in reference range.  . Potassium 02/05/2014 5.0  3.5 - 5.1 mmol/L Final   Please note change in reference range.  . Chloride 02/05/2014 103  96 - 112 mEq/L Final  . CO2 02/05/2014 28  19 - 32 mmol/L Final  . Glucose, Bld 02/05/2014 95  70 - 99 mg/dL Final  . BUN 02/05/2014 17  6 - 23 mg/dL Final  . Creatinine, Ser 02/05/2014 0.81  0.50 - 1.10 mg/dL Final  . Calcium 02/05/2014 9.6  8.4 - 10.5 mg/dL Final  . GFR calc non Af Amer 02/05/2014 77* >90 mL/min Final  . GFR calc Af Amer 02/05/2014 89* >90 mL/min Final   Comment: (NOTE) The eGFR has been calculated using the CKD EPI  equation. This calculation has not been validated in all clinical situations. eGFR's persistently <90 mL/min signify possible Chronic Kidney Disease.   . Anion gap 02/05/2014 8  5 - 15 Final  . WBC 02/05/2014 8.8  4.0 - 10.5 K/uL Final  . RBC 02/05/2014 4.10  3.87 - 5.11 MIL/uL Final  . Hemoglobin 02/05/2014 13.3  12.0 - 15.0 g/dL Final  . HCT 02/05/2014 41.6  36.0 - 46.0 % Final  . MCV 02/05/2014 101.5* 78.0 - 100.0 fL Final  . MCH 02/05/2014 32.4  26.0 - 34.0 pg Final  . MCHC 02/05/2014 32.0  30.0 - 36.0 g/dL Final  . RDW 02/05/2014 12.7  11.5 - 15.5 % Final  . Platelets 02/05/2014 343  150 - 400 K/uL Final    Recent Labs  02/05/14 1130 02/07/14 0453  HGB 13.3 10.7*    Recent Labs  02/05/14 1130 02/07/14 0453  WBC 8.8 12.7*  RBC 4.10 3.33*  HCT 41.6 34.6*  PLT 343 274    Recent Labs  02/05/14 1130 02/07/14 0453  NA 139 142  K 5.0 4.6  CL 103 106  CO2 28 30  BUN 17 10  CREATININE 0.81 0.82  GLUCOSE 95 100*  CALCIUM 9.6 9.0   No results for input(s): LABPT, INR in the last 72 hours.  X-Rays:Dg Lumbar Spine 2-3 Views  02/05/2014  CLINICAL DATA:  Lumbar decompression.  EXAM: LUMBAR SPINE - 2-3 VIEW  COMPARISON:  05/31/2012.  FINDINGS: Patient has had prior L5-S1 posterior interbody fusion. Prior laminectomy at this level. Normal alignment to No acute bony abnormality. Surgical clips right upper quadrant.  IMPRESSION: Prior L5-S1 posterior/ interbody fusion and laminectomy.   Electronically Signed   By: [place]   On: 02/05/2014 14:19   Dg Spine Portable 1 View  02/06/2014   CLINICAL DATA:  Lumbar spine surgery.  EXAM: PORTABLE SPINE - 1 VIEW  COMPARISON:  Intraoperative radiograph earlier today  FINDINGS: Two metallic surgical probes are present, 1 directed superiorly with tip at the posterior aspect of the L3-4 disc space and 1 directed inferiorly with tip at the posterior aspect of the L4-5 disc space. Posterior soft tissue retractors are noted. Sequelae of  prior L4-5 posterior and interbody fusion are again identified.  IMPRESSION: Intraoperative localization as above.   Electronically Signed   By: Logan Bores   On: 02/06/2014 10:01   Dg Spine Portable 1 View  02/06/2014   CLINICAL DATA:  Intraoperative localization for lumbar spine surgery.  EXAM: PORTABLE SPINE - 1 VIEW  COMPARISON:  02/05/2014  FINDINGS: Interbody and posterior spinal fusion hardware again seen at level of L5-S1. Instruments are seen posteriorly overlying the L4 and L5 spinous processes.  IMPRESSION: Localization of posterior spinous processes at L4 and L5.   Electronically Signed   By: Earle Gell M.D.   On: 02/06/2014 09:06    EKG:No orders found for this or any previous visit.   Hospital Course: Patient was admitted to [facility] and taken to the OR and underwent the above state procedure without complications.  Patient tolerated the procedure well and was later transferred to the recovery room and then to the orthopaedic floor for postoperative care.  They were given PO and IV analgesics for pain control following their surgery.  They were given 24 hours of postoperative antibiotics.   PT was consulted postop to assist with mobility and transfers.  The patient was allowed to be WBAT with therapy and was taught back precautions. Discharge planning was consulted to help with postop disposition and equipment needs.  Patient had a fair night on the evening of surgery and started to get up OOB with therapy on day one. Patient was seen in rounds daily, did not feel pain was controlled for D/C on POD #1 and was ready to go home on POD #2.  They were given discharge instructions and dressing directions.  They were instructed on when to follow up in the office with Dr. Tonita Cong.   Diet: Regular diet Activity:WBAT; Lspine precautions Follow-up:in 10-14 days Disposition - Home Discharged Condition: good   Discharge Instructions    Call MD / Call 911    Complete by:  As directed     If you experience chest pain or shortness of breath, CALL 911 and be transported to the hospital emergency room.  If you develope a fever above 101 F, pus (white drainage) or increased drainage or redness at the wound, or calf pain, call your surgeon's office.     Constipation Prevention    Complete by:  As directed   Drink plenty of fluids.  Prune juice may be helpful.  You may use a stool softener, such as Colace (over the counter) 100 mg twice a day.  Use MiraLax (over the counter) for constipation as needed.     Diet - low sodium heart  healthy    Complete by:  As directed      Increase activity slowly as tolerated    Complete by:  As directed             Medication List    STOP taking these medications        HYDROcodone-acetaminophen 10-325 MG per tablet  Commonly known as:  NORCO     mupirocin nasal ointment 2 %  Commonly known as:  BACTROBAN      TAKE these medications        ascorbic acid 1000 MG tablet  Commonly known as:  VITAMIN C  Take 1,000 mg by mouth daily.     CALCIUM 600 + D PO  Take 1 tablet by mouth daily.     cholecalciferol 1000 UNITS tablet  Commonly known as:  VITAMIN D  Take 1,000 Units by mouth daily.     docusate sodium 100 MG capsule  Commonly known as:  COLACE  Take 1 capsule (100 mg total) by mouth 2 (two) times daily as needed for mild constipation.     estrogens (conjugated) 0.45 MG tablet  Commonly known as:  PREMARIN  Take 0.45 mg by mouth every morning.     fexofenadine 180 MG tablet  Commonly known as:  ALLEGRA  Take 180 mg by mouth daily.     fish oil-omega-3 fatty acids 1000 MG capsule  Take 1 g by mouth daily.     gabapentin 300 MG capsule  Commonly known as:  NEURONTIN  Take 300 mg by mouth 3 (three) times daily.     iron polysaccharides 150 MG capsule  Commonly known as:  NIFEREX  Take 150 mg by mouth daily.     lidocaine 5 %  Commonly known as:  LIDODERM  Place 1 patch onto the skin daily. Remove & Discard patch  within 12 hours or as directed by MD     methocarbamol 500 MG tablet  Commonly known as:  ROBAXIN  Take 1 tablet (500 mg total) by mouth 3 (three) times daily.     ondansetron 4 MG tablet  Commonly known as:  ZOFRAN  Take 4 mg by mouth every 8 (eight) hours as needed for nausea or vomiting.     oxybutynin 15 MG 24 hr tablet  Commonly known as:  DITROPAN XL  Take 15 mg by mouth daily.     oxyCODONE-acetaminophen 7.5-325 MG per tablet  Commonly known as:  PERCOCET  Take 1 tablet by mouth every 4 (four) hours as needed for pain.     pantoprazole 40 MG tablet  Commonly known as:  PROTONIX  Take 40 mg by mouth daily.     polyethylene glycol packet  Commonly known as:  MIRALAX / GLYCOLAX  Take 17 g by mouth daily.     POTASSIUM GLUCONATE PO  Take 1 tablet by mouth daily.     topiramate 25 MG tablet  Commonly known as:  TOPAMAX  Take 75 mg by mouth at bedtime.     traZODone 50 MG tablet  Commonly known as:  DESYREL  Take 50 mg by mouth at bedtime.           Follow-up Information    Follow up with BEANE,JEFFREY C, MD In 2 weeks.   Specialty:  Orthopedic Surgery   Why:  For suture removal   Contact information:   [address] [place] [address] [phone]       Signed: Lacie Draft,  PA-C Orthopaedic Surgery 02/08/2014, 8:30 AM

## 2014-02-08 NOTE — Progress Notes (Signed)
Subjective: 2 Days Post-Op Procedure(s) (LRB): LUMBAR DECOMPRESSION L3-L4, REDO LUMBAR DECOMPRESSION L4-L5 ON THE RIGHT (Right) Patient reports pain as mild.  Reports pain improved from yesterday. Denies leg pain, numbness, tingling. Voiding without difficulty. No N/V. Walked in the hallway herself last night with no difficulty. She does feel she is able to D/C home today.  Objective: Vital signs in last 24 hours: Temp:  [97.7 F (36.5 C)-98.2 F (36.8 C)] 98.2 F (36.8 C) (01/08 0459) Pulse Rate:  [70-76] 75 (01/08 0459) Resp:  [16-19] 16 (01/08 0459) BP: (98-104)/(52-60) 98/58 mmHg (01/08 0459) SpO2:  [96 %-99 %] 96 % (01/08 0459)  Intake/Output from previous day: 01/07 0701 - 01/08 0700 In: 1970 [P.O.:1920; IV Piggyback:50] Out: 600 [Urine:600] Intake/Output this shift:     Recent Labs  02/05/14 1130 02/07/14 0453  HGB 13.3 10.7*    Recent Labs  02/05/14 1130 02/07/14 0453  WBC 8.8 12.7*  RBC 4.10 3.33*  HCT 41.6 34.6*  PLT 343 274    Recent Labs  02/05/14 1130 02/07/14 0453  NA 139 142  K 5.0 4.6  CL 103 106  CO2 28 30  BUN 17 10  CREATININE 0.81 0.82  GLUCOSE 95 100*  CALCIUM 9.6 9.0   No results for input(s): LABPT, INR in the last 72 hours.  Neurologically intact ABD soft Neurovascular intact Sensation intact distally Intact pulses distally Dorsiflexion/Plantar flexion intact Incision: dressing C/D/I and no drainage No cellulitis present Compartment soft no sign of DVT  Assessment/Plan: 2 Days Post-Op Procedure(s) (LRB): LUMBAR DECOMPRESSION L3-L4, REDO LUMBAR DECOMPRESSION L4-L5 ON THE RIGHT (Right) Advance diet Up with therapy D/C IV fluids  Plan D/C home today Discussed Lspine precautions, activity modifications Follow up 10-14 days post-op in office for staple removal Will discuss with Dr. Mliss Fritz, [place] Rolls. 02/08/2014, 8:27 AM

## 2014-04-17 DIAGNOSIS — Z86718 Personal history of other venous thrombosis and embolism: Secondary | ICD-10-CM | POA: Insufficient documentation

## 2014-04-17 DIAGNOSIS — N816 Rectocele: Secondary | ICD-10-CM | POA: Insufficient documentation

## 2014-05-07 DIAGNOSIS — Z87440 Personal history of urinary (tract) infections: Secondary | ICD-10-CM | POA: Insufficient documentation

## 2014-06-04 DIAGNOSIS — R200 Anesthesia of skin: Secondary | ICD-10-CM | POA: Insufficient documentation

## 2014-08-08 IMAGING — CR DG LUMBAR SPINE 2-3V
3 series · 3 of 3 positions shown · non-contrast
Comparison: Post-myelogram CT scan 12/13/2007.

CLINICAL DATA: Preoperative films.  Patient for lumbar
decompression.

LUMBAR SPINE - 2-3 VIEW

[t l-spine a.p.]
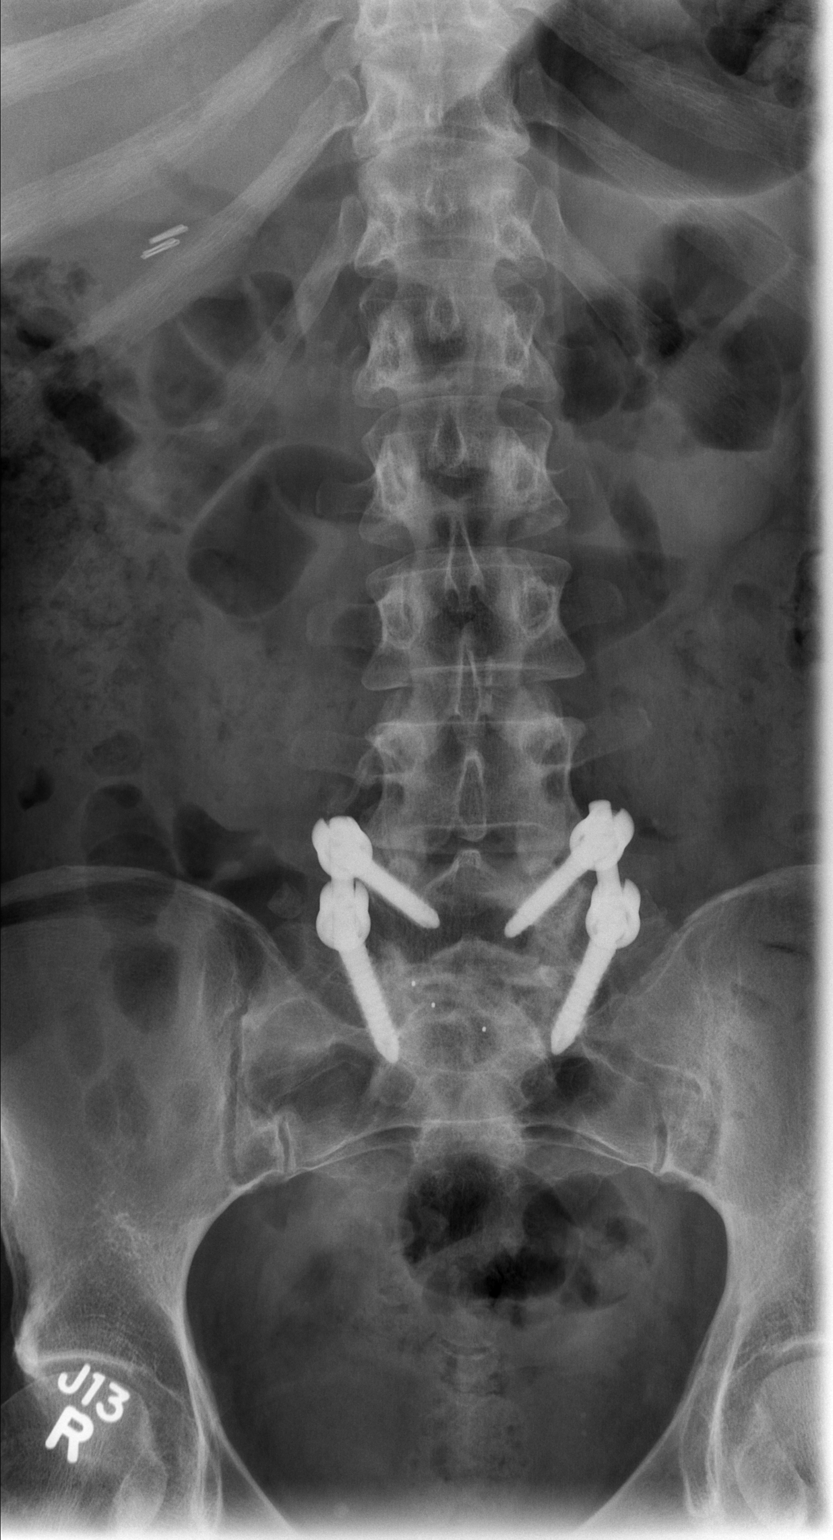

[t l-spine lat]
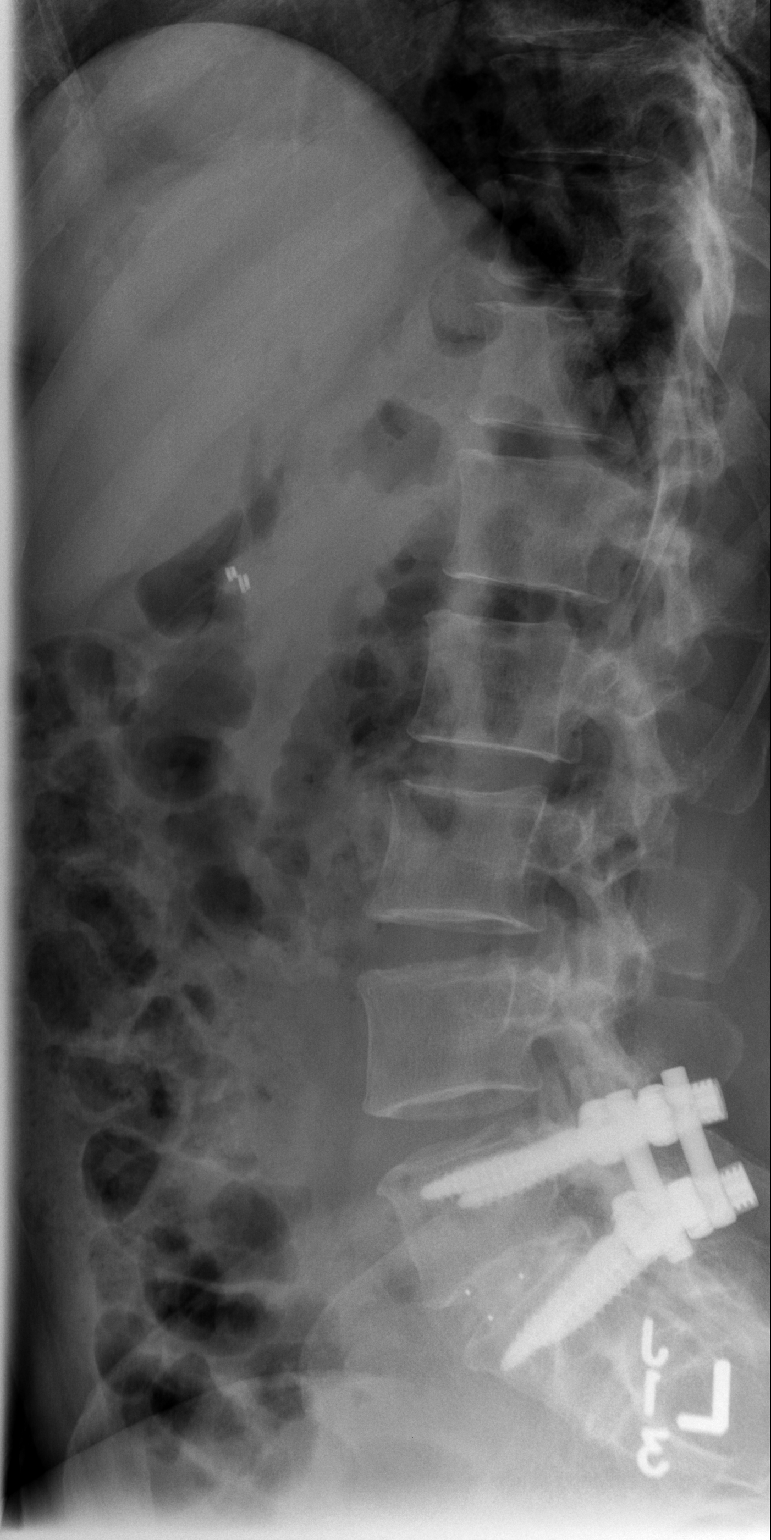

[t l-spine l5-s1 spot]
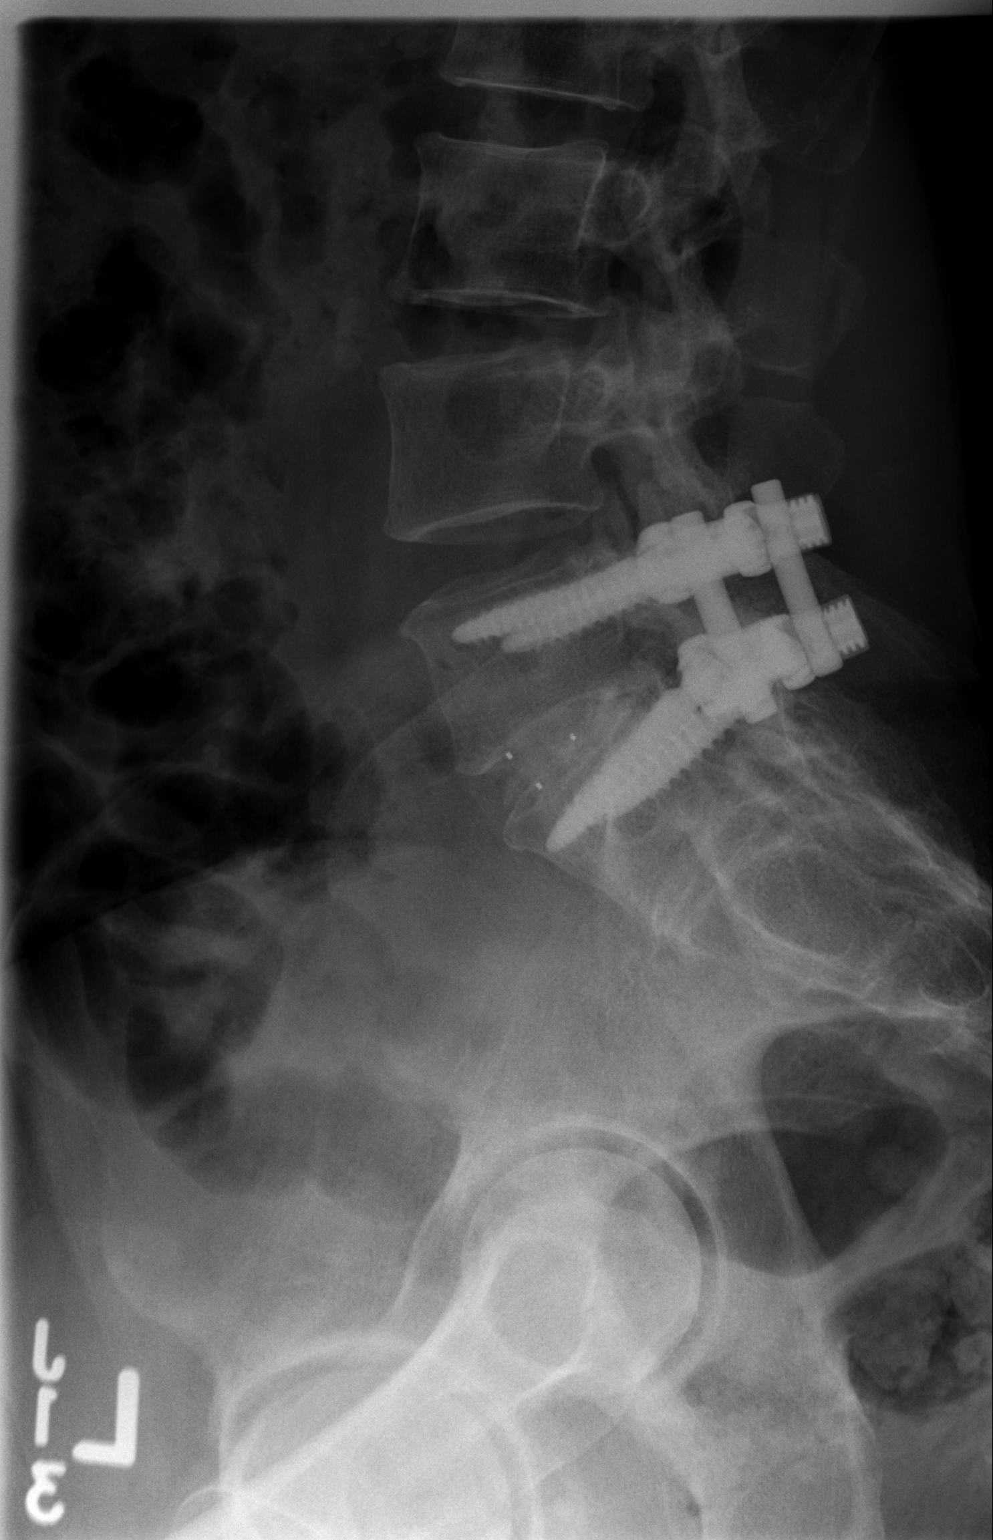

[3 of 3 positions shown; findings below may reference images not displayed]

FINDINGS: The patient is status post L5-S1 laminectomy and fusion.
Vertebral body height and alignment are maintained.  Intervertebral
disc space height appears maintained.
IMPRESSION: No acute finding.  Status post L5-S1 fusion.

## 2014-09-04 DIAGNOSIS — K449 Diaphragmatic hernia without obstruction or gangrene: Secondary | ICD-10-CM | POA: Insufficient documentation

## 2015-01-26 DIAGNOSIS — D72829 Elevated white blood cell count, unspecified: Secondary | ICD-10-CM | POA: Insufficient documentation

## 2015-01-26 DIAGNOSIS — G43909 Migraine, unspecified, not intractable, without status migrainosus: Secondary | ICD-10-CM | POA: Insufficient documentation

## 2015-09-11 DIAGNOSIS — F4310 Post-traumatic stress disorder, unspecified: Secondary | ICD-10-CM | POA: Insufficient documentation

## 2015-09-11 DIAGNOSIS — F410 Panic disorder [episodic paroxysmal anxiety] without agoraphobia: Secondary | ICD-10-CM | POA: Insufficient documentation

## 2015-09-11 DIAGNOSIS — F330 Major depressive disorder, recurrent, mild: Secondary | ICD-10-CM | POA: Insufficient documentation

## 2015-09-11 DIAGNOSIS — F5105 Insomnia due to other mental disorder: Secondary | ICD-10-CM | POA: Insufficient documentation

## 2015-11-03 DIAGNOSIS — M47812 Spondylosis without myelopathy or radiculopathy, cervical region: Secondary | ICD-10-CM | POA: Insufficient documentation

## 2015-11-19 ENCOUNTER — Other Ambulatory Visit: Payer: Self-pay | Admitting: Neurological Surgery

## 2015-11-19 DIAGNOSIS — M542 Cervicalgia: Secondary | ICD-10-CM

## 2015-11-28 ENCOUNTER — Ambulatory Visit
Admission: RE | Admit: 2015-11-28 | Discharge: 2015-11-28 | Disposition: A | Discharge status: Home / Self Care | Payer: BLUE CROSS/BLUE SHIELD | Source: Ambulatory Visit | Attending: Neurological Surgery | Admitting: Neurological Surgery

## 2015-11-28 VITALS — BP 134/69 | HR 69

## 2015-11-28 DIAGNOSIS — M542 Cervicalgia: Secondary | ICD-10-CM

## 2015-11-28 DIAGNOSIS — M5030 Other cervical disc degeneration, unspecified cervical region: Secondary | ICD-10-CM

## 2015-11-28 MED ORDER — DIAZEPAM 5 MG PO TABS
10.0000 mg | ORAL_TABLET | Freq: Once | ORAL | Status: AC
  Administered 2015-11-28: 10 mg via ORAL

## 2015-11-28 MED ORDER — IOPAMIDOL (ISOVUE-M 300) INJECTION 61%
10.0000 mL | Freq: Once | INTRAMUSCULAR | Status: AC | PRN
  Administered 2015-11-28: 10 mL via INTRATHECAL

## 2015-11-28 MED ORDER — ONDANSETRON HCL 4 MG/2ML IJ SOLN
4.0000 mg | Freq: Once | INTRAMUSCULAR | Status: AC
  Administered 2015-11-28: 4 mg via INTRAMUSCULAR

## 2015-11-28 MED ORDER — ONDANSETRON HCL 4 MG/2ML IJ SOLN: 4.0000 mg | Freq: Four times a day (QID) | INTRAMUSCULAR | Status: DC | PRN

## 2015-11-28 MED ORDER — MEPERIDINE HCL 100 MG/ML IJ SOLN
75.0000 mg | Freq: Once | INTRAMUSCULAR | Status: AC
  Administered 2015-11-28: 75 mg via INTRAMUSCULAR

## 2015-11-28 NOTE — Discharge Instructions (Signed)
Myelogram Discharge Instructions  1. Go home and rest quietly for the next 24 hours.  It is important to lie flat for the next 24 hours.  Get up only to go to the restroom.  You may lie in the bed or on a couch on your back, your stomach, your left side or your right side.  You may have one pillow under your head.  You may have pillows between your knees while you are on your side or under your knees while you are on your back.  2. DO NOT drive today.  Recline the seat as far back as it will go, while still wearing your seat belt, on the way home.  3. You may get up to go to the bathroom as needed.  You may sit up for 10 minutes to eat.  You may resume your normal diet and medications unless otherwise indicated.  Drink lots of extra fluids today and tomorrow.  4. The incidence of headache, nausea, or vomiting is about 5% (one in 20 patients).  If you develop a headache, lie flat and drink plenty of fluids until the headache goes away.  Caffeinated beverages may be helpful.  If you develop severe nausea and vomiting or a headache that does not go away with flat bed rest, call [phone].  5. You may resume normal activities after your 24 hours of bed rest is over; however, do not exert yourself strongly or do any heavy lifting tomorrow. If when you get up you have a headache when standing, go back to bed and force fluids for another 24 hours.  6. Call your physician for a follow-up appointment.  The results of your myelogram will be sent directly to your physician by the following day.  7. If you have any questions or if complications develop after you arrive home, please call [phone].  Discharge instructions have been explained to the patient.  The patient, or the person responsible for the patient, fully understands these instructions.       May resume Trazodone on Oct. 28, 2017, after 1:00 pm.

## 2015-11-28 NOTE — Progress Notes (Signed)
Pt stopped Trazodone on Saturday.

## 2015-12-01 ENCOUNTER — Telehealth: Payer: Self-pay

## 2015-12-01 NOTE — Telephone Encounter (Signed)
LMOM asking patient how she is doing after her myelogram here 11/28/15.  Ralene Bathe

## 2016-05-17 ENCOUNTER — Emergency Department (HOSPITAL_COMMUNITY)
Admission: EM | Admit: 2016-05-17 | Discharge: 2016-05-18 | Disposition: A | Discharge status: Home / Self Care | Payer: BLUE CROSS/BLUE SHIELD | Attending: Emergency Medicine | Admitting: Emergency Medicine

## 2016-05-17 ENCOUNTER — Encounter (HOSPITAL_COMMUNITY): Payer: Self-pay

## 2016-05-17 DIAGNOSIS — M542 Cervicalgia: Secondary | ICD-10-CM | POA: Diagnosis present

## 2016-05-17 DIAGNOSIS — M5412 Radiculopathy, cervical region: Secondary | ICD-10-CM | POA: Diagnosis not present

## 2016-05-17 DIAGNOSIS — Z79899 Other long term (current) drug therapy: Secondary | ICD-10-CM | POA: Insufficient documentation

## 2016-05-17 DIAGNOSIS — Z87891 Personal history of nicotine dependence: Secondary | ICD-10-CM | POA: Diagnosis not present

## 2016-05-17 MED ORDER — ONDANSETRON HCL 4 MG/2ML IJ SOLN
4.0000 mg | Freq: Once | INTRAMUSCULAR | Status: AC
  Administered 2016-05-17: 4 mg via INTRAVENOUS
  Filled 2016-05-17: qty 2

## 2016-05-17 MED ORDER — METHYLPREDNISOLONE SODIUM SUCC 125 MG IJ SOLR
125.0000 mg | Freq: Once | INTRAMUSCULAR | Status: AC
  Administered 2016-05-17: 125 mg via INTRAVENOUS
  Filled 2016-05-17: qty 2

## 2016-05-17 MED ORDER — DIAZEPAM 5 MG PO TABS
5.0000 mg | ORAL_TABLET | Freq: Once | ORAL | Status: AC
  Administered 2016-05-17: 5 mg via ORAL
  Filled 2016-05-17: qty 1

## 2016-05-17 MED ORDER — HYDROMORPHONE HCL 1 MG/ML IJ SOLN
1.0000 mg | INTRAMUSCULAR | Status: AC | PRN
  Administered 2016-05-17 (×2): 1 mg via INTRAVENOUS
  Filled 2016-05-17 (×2): qty 1

## 2016-05-17 MED ORDER — HYDROMORPHONE HCL 1 MG/ML IJ SOLN
1.0000 mg | Freq: Once | INTRAMUSCULAR | Status: AC
  Administered 2016-05-17: 1 mg via INTRAVENOUS
  Filled 2016-05-17: qty 1

## 2016-05-17 NOTE — ED Provider Notes (Signed)
[facility] DEPT Provider Note   CSN: [id number] Arrival date & time: 05/17/16  1624     History   Chief Complaint Chief Complaint  Patient presents with  . Neck Pain    HPI [name] is a 66 y.o. female.  Patient with history of cervical spinal stenosis, previous fusion at 2 levels by Dr. Ronnald Ramp, followed by pain management Dr. Nelva Bush -- presents with complaint of acute on chronic neck pain which has been severe and unrelieved by her home medications including Vicodin and Robaxin for the past 2 days. Pain is in her lower neck and radiates into her shoulder blades. She describes it as throbbing. She has constant numbness in her left hand which is unchanged. She denies any new weakness in her upper extremities. She has no lower extremity symptoms including numbness, weakness, or tingling. No difficulty walking. Patient denies other warning symptoms of back pain including: fecal incontinence, urinary retention or overflow incontinence, night sweats, waking from sleep with back pain, unexplained fevers or weight loss, h/o cancer, IVDU, recent trauma.   Patient states that she is due to have additional fusion surgery as soon as her insurance will clear it. She states that fusion below where she is currently fused is planned. She has had steroid injections in the past without relief, none recently. She reports remote use of prednisone which seemed to help her symptoms in the past.       Past Medical History:  Diagnosis Date  . Anemia   . Anxiety 2000  . Depression 2000  . GERD (gastroesophageal reflux disease)   . Headache(784.0)   . PONV (postoperative nausea and vomiting)   . Seasonal allergies     Patient Active Problem List   Diagnosis Date Noted  . Spinal stenosis at L4-L5 level 02/06/2014  . Spinal stenosis, lumbar region, without neurogenic claudication 05/31/2012  . HEMORRHAGE OF RECTUM AND ANUS 11/20/2008  . PANCREATITIS 11/20/2008  . [place] DISEASE, CERVICAL  11/14/2008  . HEADACHE, CHRONIC 11/14/2008  . DEPRESSION, HX OF 11/14/2008    Past Surgical History:  Procedure Laterality Date  . ABDOMINAL HYSTERECTOMY  1984  . BACK SURGERY  2006, 2008   lower back, fusion in l5-s1  . CHOLECYSTECTOMY  2003  . ERCP W/ SPHINCTEROTOMY AND BALLOON DILATION  2005   pancreatitis   . LUMBAR LAMINECTOMY/DECOMPRESSION MICRODISCECTOMY N/A 05/31/2012   Procedure: MICRO LUMBAR DECOMPRESSION L4 - L5, L5 - S1 ;  Surgeon: Johnn Hai, MD;  Location: [facility] ORS;  Service: Orthopedics;  Laterality: N/A;  . LUMBAR LAMINECTOMY/DECOMPRESSION MICRODISCECTOMY Right 02/06/2014   Procedure: LUMBAR DECOMPRESSION L3-L4, REDO LUMBAR DECOMPRESSION L4-L5 ON THE RIGHT;  Surgeon: Johnn Hai, MD;  Location: [facility] ORS;  Service: Orthopedics;  Laterality: Right;  . NECK SURGERY  2001,2007   herniated disc- cervical fusion(limited side to side mobility)  . SHOULDER ARTHROSCOPY Bilateral 2010, 2011   debridement    OB History    No data available       Home Medications    Prior to Admission medications   Medication Sig Start Date End Date Taking? Authorizing Provider  ascorbic acid (VITAMIN C) 1000 MG tablet Take 1,000 mg by mouth daily.    Historical Provider, MD  Calcium Carb-Cholecalciferol (CALCIUM 600 + D PO) Take 1 tablet by mouth daily.    Historical Provider, MD  docusate sodium (COLACE) 100 MG capsule Take 1 capsule (100 mg total) by mouth 2 (two) times daily as needed for mild constipation. 02/06/14  Susa Day, MD  estrogens, conjugated, (PREMARIN) 0.45 MG tablet Take 0.45 mg by mouth every morning.    Historical Provider, MD  fexofenadine (ALLEGRA) 180 MG tablet Take 180 mg by mouth daily.    Historical Provider, MD  fish oil-omega-3 fatty acids 1000 MG capsule Take 1 g by mouth daily.     Historical Provider, MD  gabapentin (NEURONTIN) 300 MG capsule Take 300 mg by mouth 3 (three) times daily.    Historical Provider, MD  HYDROcodone-acetaminophen (NORCO) 10-325  MG tablet Take 1 tablet by mouth every 6 (six) hours as needed.    Historical Provider, MD  iron polysaccharides (NIFEREX) 150 MG capsule Take 150 mg by mouth daily.    Historical Provider, MD  methocarbamol (ROBAXIN) 500 MG tablet Take 1 tablet (500 mg total) by mouth 3 (three) times daily. 02/06/14   Susa Day, MD  mirabegron ER (MYRBETRIQ) 50 MG TB24 tablet Take 50 mg by mouth daily.    Historical Provider, MD  pantoprazole (PROTONIX) 40 MG tablet Take 40 mg by mouth daily.    Historical Provider, MD  polyethylene glycol (MIRALAX / GLYCOLAX) packet Take 17 g by mouth daily.    Historical Provider, MD  POTASSIUM GLUCONATE PO Take 1 tablet by mouth daily.    Historical Provider, MD  topiramate (TOPAMAX) 25 MG tablet Take 75 mg by mouth at bedtime.    Historical Provider, MD  traZODone (DESYREL) 50 MG tablet Take 50 mg by mouth at bedtime.    Historical Provider, MD    Family History History reviewed. No pertinent family history.  Social History Social History  Substance Use Topics  . Smoking status: Former Smoker    Packs/day: 0.50    Years: 3.00    Types: Cigarettes    Quit date: 02/02/1972  . Smokeless tobacco: Never Used  . Alcohol use No     Allergies   Patient has no known allergies.   Review of Systems Review of Systems  Constitutional: Negative for fever and unexpected weight change.  Gastrointestinal: Negative for constipation.       Negative for fecal incontinence.   Genitourinary: Negative for dysuria, flank pain, hematuria, pelvic pain, vaginal bleeding and vaginal discharge.       Negative for urinary incontinence or retention.  Musculoskeletal: Positive for back pain (chronic, unchanged) and neck pain (acute on chronic).  Neurological: Negative for weakness and numbness.       Denies saddle paresthesias.     Physical Exam Updated Vital Signs BP (!) 128/58 (BP Location: Left Arm)   Pulse 67   Temp 97.7 F (36.5 C) (Oral)   Resp 18   SpO2 97%    Physical Exam  Constitutional: She appears well-developed and well-nourished.  HENT:  Head: Normocephalic and atraumatic.  Mouth/Throat: Oropharynx is clear and moist.  Eyes: Conjunctivae are normal.  Neck: Normal range of motion. Neck supple.  Pulmonary/Chest: Effort normal.  Abdominal: Soft. There is no tenderness. There is no CVA tenderness.  Musculoskeletal:       Right shoulder: She exhibits normal range of motion, no tenderness and no bony tenderness.       Left shoulder: She exhibits tenderness. She exhibits normal range of motion and no bony tenderness.       Cervical back: She exhibits tenderness. She exhibits normal range of motion and no bony tenderness.       Thoracic back: She exhibits normal range of motion, no tenderness and no bony tenderness.  Back:  No step-off noted with palpation of spine.   Neurological: She is alert. She has normal strength and normal reflexes. No sensory deficit.  5/5 strength in entire lower extremities bilaterally. No sensation deficit.   Skin: Skin is warm and dry. No rash noted.  Psychiatric: She has a normal mood and affect.  Nursing note and vitals reviewed.    ED Treatments / Results  Labs (all labs ordered are listed, but only abnormal results are displayed) Labs Reviewed - No data to display  EKG  EKG Interpretation None       Radiology No results found.  Procedures Procedures (including critical care time)  Medications Ordered in ED Medications  HYDROmorphone (DILAUDID) injection 1 mg (1 mg Intravenous Given 05/17/16 2217)  ondansetron (ZOFRAN) injection 4 mg (4 mg Intravenous Given 05/17/16 2114)  methylPREDNISolone sodium succinate (SOLU-MEDROL) 125 mg/2 mL injection 125 mg (125 mg Intravenous Given 05/17/16 2114)  HYDROmorphone (DILAUDID) injection 1 mg (1 mg Intravenous Given 05/17/16 2330)  diazepam (VALIUM) tablet 5 mg (5 mg Oral Given 05/17/16 2330)  oxyCODONE-acetaminophen (PERCOCET/ROXICET) 5-325 MG per  tablet 2 tablet (2 tablets Oral Given 05/18/16 0045)     Initial Impression / Assessment and Plan / ED Course  I have reviewed the triage vital signs and the nursing notes.  Pertinent labs & imaging results that were available during my care of the patient were reviewed by me and considered in my medical decision making (see chart for details).     Patient seen and examined. Medications ordered. Will treat with steroids given no recent use of steroids and reported improvement in past. She has no lower extremity symptoms to make me think she has an emergent central cord etiology at this time. Will treat pain and reassess.  Vital signs reviewed and are as follows: BP 134/78 (BP Location: Left Arm)   Pulse 78   Temp 97.7 F (36.5 C) (Oral)   Resp 18   SpO2 99%   10:13 PM Pain slightly improved. Will give additional dose of pain medication.   12:45 AM Patient discussed with and seen earlier by Dr. Dayna Barker.   I spoke with neurosurgery PA Costella -- would not reimage unless new motor deficit.   Pt received a third dose of Dilaudid, oral Valium.   Currently she is feeling better. Offered admission versus discharge. She states that she feels well enough to go home. Will give tapered course of prednisone. Will give dose of oral Percocet prior to discharge. She will resume her home pain medications and home Robaxin. She will speak with her neurosurgeon office tomorrow for further instructions.  Patient urged to follow-up with PCP if pain does not improve with treatment and rest or if pain becomes recurrent. Urged to return with worsening uncontrolled severe pain, loss of bowel or bladder control, trouble walking.   The patient verbalizes understanding and agrees with the plan.    Final Clinical Impressions(s) / ED Diagnoses   Final diagnoses:  Cervical radiculopathy, acute   Patient with Chronic cervical radiculopathy with acute worsening of her pain. Pain not controlled with  medications. No new neurological deficits with exception of worsening sensory of left hand. No motor weakness. Patient is ambulatory. No warning symptoms of back pain including: fecal incontinence, urinary retention or overflow incontinence, night sweats, waking from sleep with back pain, unexplained fevers or weight loss, h/o cancer, IVDU, recent trauma. No concern for cauda equina, epidural abscess, or other more serious cause of back pain. Continued home  pain management with close follow-up with neurosurgery indicated at this point. Return instructions as above.   New Prescriptions New Prescriptions   PREDNISONE (DELTASONE) 20 MG TABLET    3 Tabs PO Days 1-3, then 2 tabs PO Days 4-6, then 1 tab PO Day 7-9, then Half Tab PO Day [address] [place] 05/18/16 0049    Merrily Pew, MD 05/18/16 0110

## 2016-05-17 NOTE — ED Notes (Signed)
PA-C at bedside 

## 2016-05-17 NOTE — ED Triage Notes (Signed)
Pt has chronic neck pain but has gotten worse over the last 3 days. She reports her pain meds at home are not controlling the pain. She is supposed to have surgery but waiting for insurance approval. Pt reports pain is causing numbness bilaterally in her arms.

## 2016-05-18 MED ORDER — OXYCODONE-ACETAMINOPHEN 5-325 MG PO TABS
2.0000 | ORAL_TABLET | Freq: Once | ORAL | Status: AC
  Administered 2016-05-18: 2 via ORAL
  Filled 2016-05-18: qty 2

## 2016-05-18 MED ORDER — PREDNISONE 20 MG PO TABS: ORAL_TABLET | ORAL | 0 refills | Status: DC

## 2016-05-18 NOTE — Discharge Instructions (Signed)
Please read and follow all provided instructions.  Your diagnoses today include:  1. Cervical radiculopathy, acute     Tests performed today include:  Vital signs - see below for your results today  Medications prescribed:   Prednisone - steroid medicine   It is best to take this medication in the morning to prevent sleeping problems. If you are diabetic, monitor your blood sugar closely and stop taking Prednisone if blood sugar is over 300. Take with food to prevent stomach upset.   Take any prescribed medications only as directed.  Home care instructions:   Follow any educational materials contained in this packet  Please rest, use ice or heat on your back for the next several days  Do not lift, push, pull anything more than 10 pounds for the next week  Follow-up instructions: Please follow-up with your neurosurgeon for further evaluation of your symptoms.   Return instructions:  SEEK IMMEDIATE MEDICAL ATTENTION IF YOU HAVE:  New numbness, tingling, weakness, or problem with the use of your arms or legs  Severe back pain not relieved with medications  Loss control of your bowels or bladder  Increasing pain in any areas of the body (such as chest or abdominal pain)  Shortness of breath, dizziness, or fainting.   Worsening nausea (feeling sick to your stomach), vomiting, fever, or sweats  Any other emergent concerns regarding your health   Additional Information:  Your vital signs today were: BP (!) 136/91    Pulse 81    Temp 97.7 F (36.5 C) (Oral)    Resp 18    SpO2 93%  If your blood pressure (BP) was elevated above 135/85 this visit, please have this repeated by your doctor within one month. --------------

## 2016-06-06 ENCOUNTER — Encounter (HOSPITAL_COMMUNITY): Payer: Self-pay | Admitting: Emergency Medicine

## 2016-06-06 ENCOUNTER — Emergency Department (HOSPITAL_COMMUNITY)
Admission: EM | Admit: 2016-06-06 | Discharge: 2016-06-06 | Disposition: A | Discharge status: Home / Self Care | Payer: BLUE CROSS/BLUE SHIELD | Attending: Emergency Medicine | Admitting: Emergency Medicine

## 2016-06-06 DIAGNOSIS — Z96612 Presence of left artificial shoulder joint: Secondary | ICD-10-CM | POA: Insufficient documentation

## 2016-06-06 DIAGNOSIS — Z96611 Presence of right artificial shoulder joint: Secondary | ICD-10-CM | POA: Diagnosis not present

## 2016-06-06 DIAGNOSIS — G8929 Other chronic pain: Secondary | ICD-10-CM | POA: Diagnosis not present

## 2016-06-06 DIAGNOSIS — M546 Pain in thoracic spine: Secondary | ICD-10-CM | POA: Insufficient documentation

## 2016-06-06 DIAGNOSIS — Z79899 Other long term (current) drug therapy: Secondary | ICD-10-CM | POA: Insufficient documentation

## 2016-06-06 DIAGNOSIS — Z87891 Personal history of nicotine dependence: Secondary | ICD-10-CM | POA: Diagnosis not present

## 2016-06-06 MED ORDER — METHYLPREDNISOLONE SODIUM SUCC 125 MG IJ SOLR
125.0000 mg | Freq: Once | INTRAMUSCULAR | Status: AC
  Administered 2016-06-06: 125 mg via INTRAMUSCULAR
  Filled 2016-06-06: qty 2

## 2016-06-06 MED ORDER — DIAZEPAM 5 MG PO TABS
5.0000 mg | ORAL_TABLET | Freq: Once | ORAL | Status: AC
  Administered 2016-06-06: 5 mg via ORAL
  Filled 2016-06-06: qty 1

## 2016-06-06 MED ORDER — ONDANSETRON 4 MG PO TBDP
4.0000 mg | ORAL_TABLET | Freq: Once | ORAL | Status: AC
  Administered 2016-06-06: 4 mg via ORAL
  Filled 2016-06-06: qty 1

## 2016-06-06 MED ORDER — HYDROMORPHONE HCL 1 MG/ML IJ SOLN
1.0000 mg | Freq: Once | INTRAMUSCULAR | Status: AC
  Administered 2016-06-06: 1 mg via INTRAMUSCULAR
  Filled 2016-06-06: qty 1

## 2016-06-06 NOTE — Discharge Instructions (Signed)
Please follow up with your surgeon and pain management for repeat evaluation.  Please return if symptoms worsen, if you have changes to bowel or bladder function.

## 2016-06-06 NOTE — ED Notes (Signed)
Pt crying states that she really needs the surgery and blames the insurance company for denying it. States still in pain, encouraged to call her dr for pain meds and ask for pt advocate

## 2016-06-06 NOTE — ED Provider Notes (Signed)
[facility] DEPT Provider Note   CSN: [id number] Arrival date & time: 06/06/16  4401  By signing my name below, I, Mayer Masker, attest that this documentation has been prepared under the direction and in the presence of Wyn Quaker, [place]. Electronically Signed: Mayer Masker, Scribe. 06/06/16. 11:15 AM.  History   Chief Complaint Chief Complaint  Patient presents with  . Neck Pain  . Back Pain    The history is provided by the patient. No language interpreter was used.  HPI Comments: [name] is a 66 y.o. female with a h/o chronic pain who presents to the Emergency Department complaining of constant neck and back pain that radiates down her arms bilaterally since yesterday. She states turning her head makes the pain worse. She has associated numbness/tingling and weakness to her left arm and hand since 2 weeks which has been evaluated by her spinal surgeon, Dr. Ronnald Ramp. She also has associated nausea when her pain "gets bad", but this is not beyond baseline.  She takes hydrocodone and robaxin on a daily basis for her chronic pain, reports that today it is not enough. She denies changes to bowel and bladder function, and recent leg problems beyond baseline.   She states she has not seen her PCP recently but has an appointment in 2 weeks. Her spine doctor is Dr. Ronnald Ramp with neurosurgery and spine.    Past Medical History:  Diagnosis Date  . Anemia   . Anxiety 2000  . Depression 2000  . GERD (gastroesophageal reflux disease)   . Headache(784.0)   . PONV (postoperative nausea and vomiting)   . Seasonal allergies     Patient Active Problem List   Diagnosis Date Noted  . Spinal stenosis at L4-L5 level 02/06/2014  . Spinal stenosis, lumbar region, without neurogenic claudication 05/31/2012  . HEMORRHAGE OF RECTUM AND ANUS 11/20/2008  . PANCREATITIS 11/20/2008  . [place] DISEASE, CERVICAL 11/14/2008  . HEADACHE, CHRONIC 11/14/2008  . DEPRESSION, HX OF 11/14/2008    Past  Surgical History:  Procedure Laterality Date  . ABDOMINAL HYSTERECTOMY  1984  . BACK SURGERY  2006, 2008   lower back, fusion in l5-s1  . CHOLECYSTECTOMY  2003  . ERCP W/ SPHINCTEROTOMY AND BALLOON DILATION  2005   pancreatitis   . LUMBAR LAMINECTOMY/DECOMPRESSION MICRODISCECTOMY N/A 05/31/2012   Procedure: MICRO LUMBAR DECOMPRESSION L4 - L5, L5 - S1 ;  Surgeon: Johnn Hai, MD;  Location: [facility];  Service: Orthopedics;  Laterality: N/A;  . LUMBAR LAMINECTOMY/DECOMPRESSION MICRODISCECTOMY Right 02/06/2014   Procedure: LUMBAR DECOMPRESSION L3-L4, REDO LUMBAR DECOMPRESSION L4-L5 ON THE RIGHT;  Surgeon: Johnn Hai, MD;  Location: [facility];  Service: Orthopedics;  Laterality: Right;  . NECK SURGERY  2001,2007   herniated disc- cervical fusion(limited side to side mobility)  . SHOULDER ARTHROSCOPY Bilateral 2010, 2011   debridement    OB History    No data available       Home Medications    Prior to Admission medications   Medication Sig Start Date End Date Taking? Authorizing Provider  ascorbic acid (VITAMIN C) 1000 MG tablet Take 1,000 mg by mouth daily.    [provider]  Calcium Carb-Cholecalciferol (CALCIUM 600 + D PO) Take 1 tablet by mouth daily.    [provider]  docusate sodium (COLACE) 100 MG capsule Take 1 capsule (100 mg total) by mouth 2 (two) times daily as needed for mild constipation. 02/06/14   Susa Day, MD  fexofenadine (ALLEGRA) 180 MG tablet  Take 180 mg by mouth daily.    [provider]  fish oil-omega-3 fatty acids 1000 MG capsule Take 1 g by mouth daily.     [provider]  gabapentin (NEURONTIN) 300 MG capsule Take 300 mg by mouth 3 (three) times daily.    [provider]  HYDROcodone-acetaminophen (NORCO) 10-325 MG tablet Take 1 tablet by mouth every 6 (six) hours as needed for moderate pain.     [provider]  iron polysaccharides (NIFEREX) 150 MG capsule Take 150 mg by mouth daily.     [provider]  lidocaine (LIDODERM) 5 % Place 1 patch onto the skin daily as needed (pain). Remove & Discard patch within 12 hours or as directed by MD    [provider]  methocarbamol (ROBAXIN) 500 MG tablet Take 1 tablet (500 mg total) by mouth 3 (three) times daily. Patient taking differently: Take by mouth 3 (three) times daily as needed for muscle spasms.  02/06/14   Susa Day, MD  mirabegron ER (MYRBETRIQ) 50 MG TB24 tablet Take 50 mg by mouth daily.    [provider]  pantoprazole (PROTONIX) 40 MG tablet Take 40 mg by mouth daily.    [provider]  polyethylene glycol (MIRALAX / GLYCOLAX) packet Take 17 g by mouth daily.    [provider]  POTASSIUM GLUCONATE PO Take 1 tablet by mouth daily.    [provider]  predniSONE (DELTASONE) 20 MG tablet 3 Tabs PO Days 1-3, then 2 tabs PO Days 4-6, then 1 tab PO Day 7-9, then Half Tab PO Day 10-12 05/18/16   Carlisle Cater, PA-C  topiramate (TOPAMAX) 25 MG tablet Take 75 mg by mouth at bedtime.    [provider]  traZODone (DESYREL) 50 MG tablet Take 50 mg by mouth at bedtime.    [provider]    Family History No family history on file.  Social History Social History  Substance Use Topics  . Smoking status: Former Smoker    Packs/day: 0.50    Years: 3.00    Types: Cigarettes    Quit date: 02/02/1972  . Smokeless tobacco: Never Used  . Alcohol use No     Allergies   Patient has no known allergies.   Review of Systems Review of Systems  Gastrointestinal: Positive for nausea. Negative for constipation and diarrhea.  Genitourinary: Negative for difficulty urinating and dysuria.  Musculoskeletal: Positive for arthralgias, back pain, myalgias and neck pain.  Neurological: Positive for weakness (left arm/hand), numbness (left arm/hand) and headaches.     Physical Exam Updated Vital Signs BP (!) 122/53 (BP Location: Right Arm)   Pulse 78   Temp  98.9 F (37.2 C) (Oral)   Resp 18   Wt 70.3 kg   SpO2 100%   BMI 25.79 kg/m   Physical Exam  Constitutional: She appears well-developed and well-nourished.  HENT:  Head: Normocephalic and atraumatic.  Cardiovascular: Normal rate.   Pulmonary/Chest: Effort normal.  Neurological: She is alert. She has normal strength. She displays no atrophy. A sensory deficit (Normal baseline deficit of left forearm altered sensation) is present. No cranial nerve deficit. She exhibits normal muscle tone. Coordination and gait normal. GCS eye subscore is 4. GCS verbal subscore is 5. GCS motor subscore is 6.  Bilaterally, her triceps and brachioradialis are +2 bilaterally Good grip strength, equal bilaterally Slightly decreased sensation to left forearm, otherwise sensation is intact.   Skin: Skin is warm and dry.  Psychiatric:  She has a normal mood and affect.  Nursing note and vitals reviewed.    ED Treatments / Results  DIAGNOSTIC STUDIES: Oxygen Saturation is 100% on RA, normal by my interpretation.    COORDINATION OF CARE: 11:06 AM Discussed treatment plan with pt at bedside and pt agreed to plan.   Labs (all labs ordered are listed, but only abnormal results are displayed) Labs Reviewed - No data to display  EKG  EKG Interpretation None       Radiology No results found.  Procedures Procedures (including critical care time)  Medications Ordered in ED Medications  ondansetron (ZOFRAN-ODT) disintegrating tablet 4 mg (4 mg Oral Given 06/06/16 1131)  methylPREDNISolone sodium succinate (SOLU-MEDROL) 125 mg/2 mL injection 125 mg (125 mg Intramuscular Given 06/06/16 1128)  HYDROmorphone (DILAUDID) injection 1 mg (1 mg Intramuscular Given 06/06/16 1130)  diazepam (VALIUM) tablet 5 mg (5 mg Oral Given 06/06/16 1133)     Initial Impression / Assessment and Plan / ED Course  I have reviewed the triage vital signs and the nursing notes.  Pertinent labs & imaging results that were available  during my care of the patient were reviewed by me and considered in my medical decision making (see chart for details).    Patient with back pain.  No new neurological deficits and normal baseline neuro exam.  Patient can walk but states is painful.  No loss of bowel or bladder control.  No concern for cauda equina.  No fever, night sweats, weight loss, h/o cancer, IVDU.  RICE protocol and pain medicine indicated and discussed with patient.  Notes viewed from 4/16 when she was seen for similar, EDP noted that Dr. Ronnald Ramp would only repeat imaging if new weakness.  At this point she does not have any new weakness.  She reports that she does not have new neurological changes since the last time she was evaluated by Dr. Ronnald Ramp.    I have reviewed records of multiple ED visits with similar or other pain related complaints, usually with negative workups. I feel that the patient's pain is chronic and cannot be appropriately or safely treated in an emergency department setting.   I do not feel that providing narcotic pain medication rx is in this patient's best interest. I have urged the patient to have close follow up with their provider or pain specialist and have provided the adequate resources for this. I have explicitly discussed with the patient return precautions and have reassured patient that they can always be seen and evaluated in the emergency department for any condition that they feel is emergent, and that they will be given treatment as the EDP feels is appropriate and safe, but this may not involve the use of narcotic pain medications. The patient was given the opportunity to voice any further questions or concerns and these were addressed to the best of my ability.    At this time there does not appear to be any evidence of an acute emergency medical condition and the patient appears stable for discharge with appropriate outpatient follow up.Diagnosis was discussed with patient who verbalizes  understanding and is agreeable to discharge. Pt case discussed with Dr. Lita Mains who agrees with my plan.     Final Clinical Impressions(s) / ED Diagnoses   Final diagnoses:  Chronic midline thoracic back pain    New Prescriptions New Prescriptions   No medications on file  I personally performed the services described in this documentation, which was scribed in my presence. The  recorded information has been reviewed and is accurate.     Lorin Glass, PA-C 06/06/16 1249    Julianne Rice, MD 06/18/16 [phone]

## 2016-06-06 NOTE — ED Triage Notes (Signed)
Pt states she has had this pain for a while and was to have surgery but insurance has denied, pt  Has neck pain, back pain and the pain radiates down both arms when she turns her neck a certain way, state she has pain meds hycodone but it is not working , pt is tearful, husband is with pt ,  Sees Dr Sherley Bounds , [facility]

## 2016-08-05 ENCOUNTER — Encounter (HOSPITAL_COMMUNITY): Payer: Self-pay

## 2016-08-05 ENCOUNTER — Emergency Department (HOSPITAL_COMMUNITY)
Admission: EM | Admit: 2016-08-05 | Discharge: 2016-08-05 | Disposition: A | Discharge status: Home / Self Care | Payer: Medicare Other | Attending: Emergency Medicine | Admitting: Emergency Medicine

## 2016-08-05 DIAGNOSIS — Z79899 Other long term (current) drug therapy: Secondary | ICD-10-CM | POA: Diagnosis not present

## 2016-08-05 DIAGNOSIS — M5412 Radiculopathy, cervical region: Secondary | ICD-10-CM | POA: Diagnosis not present

## 2016-08-05 DIAGNOSIS — Z87891 Personal history of nicotine dependence: Secondary | ICD-10-CM | POA: Insufficient documentation

## 2016-08-05 DIAGNOSIS — M5090 Cervical disc disorder, unspecified, unspecified cervical region: Secondary | ICD-10-CM

## 2016-08-05 DIAGNOSIS — M549 Dorsalgia, unspecified: Secondary | ICD-10-CM | POA: Diagnosis present

## 2016-08-05 MED ORDER — DEXAMETHASONE 4 MG PO TABS
10.0000 mg | ORAL_TABLET | Freq: Once | ORAL | Status: AC
  Administered 2016-08-05: 10 mg via ORAL
  Filled 2016-08-05: qty 3

## 2016-08-05 MED ORDER — OXYCODONE-ACETAMINOPHEN 5-325 MG PO TABS
ORAL_TABLET | ORAL | Status: AC
  Filled 2016-08-05: qty 1

## 2016-08-05 MED ORDER — METHYLPREDNISOLONE 4 MG PO TBPK: ORAL_TABLET | ORAL | 0 refills | Status: DC

## 2016-08-05 MED ORDER — GABAPENTIN 300 MG PO CAPS: 300.0000 mg | ORAL_CAPSULE | Freq: Three times a day (TID) | ORAL | 0 refills | Status: DC

## 2016-08-05 MED ORDER — KETOROLAC TROMETHAMINE 60 MG/2ML IM SOLN
60.0000 mg | Freq: Once | INTRAMUSCULAR | Status: AC
  Administered 2016-08-05: 60 mg via INTRAMUSCULAR
  Filled 2016-08-05: qty 2

## 2016-08-05 MED ORDER — OXYCODONE-ACETAMINOPHEN 5-325 MG PO TABS
1.0000 | ORAL_TABLET | ORAL | Status: DC | PRN
  Administered 2016-08-05: 1 via ORAL

## 2016-08-05 NOTE — ED Triage Notes (Signed)
Pt reports pain to upper back and lower neck. She is supposed to have neck surgery but her insurance denied it. She states the pain radiates down her both arms. She states her home pain medications are not helping. Ambulatory, NAD

## 2016-08-06 NOTE — ED Provider Notes (Signed)
[facility] DEPT Provider Note   CSN: [id number] Arrival date & time: 08/05/16  7616     History   Chief Complaint Chief Complaint  Patient presents with  . Back Pain    HPI Manreet Kiernan Benedick is a 66 y.o. female.  HPI   Helayna Dun Minahan is a 66 y.o. female with a h/o chronic pain who presents to the Emergency Department complaining of constant neck and back pain that radiates down her left arm and at times bilaterally. Movement of her head and neck makes the pain worse. She has associated numbness/tingling in the left arm and hand which comes and goes.  She takes percocet and robaxin on a daily basis for her chronic pain, reports that today it is not enough. Also uses lidocaine patch at night.  Pain is severe, sharp, burning. It is keeping her from sleep. She has had this pain for years but it has been worsening over the last 2 months. She denies changes to bowel and bladder function, and recent leg problems. The insurance company would not pay for her surgery with Dr. Ronnald Ramp so she is working on an appeal.  Past Medical History:  Diagnosis Date  . Anemia   . Anxiety 2000  . Depression 2000  . GERD (gastroesophageal reflux disease)   . Headache(784.0)   . PONV (postoperative nausea and vomiting)   . Seasonal allergies     Patient Active Problem List   Diagnosis Date Noted  . Spinal stenosis at L4-L5 level 02/06/2014  . Spinal stenosis, lumbar region, without neurogenic claudication 05/31/2012  . HEMORRHAGE OF RECTUM AND ANUS 11/20/2008  . PANCREATITIS 11/20/2008  . [place] DISEASE, CERVICAL 11/14/2008  . HEADACHE, CHRONIC 11/14/2008  . DEPRESSION, HX OF 11/14/2008    Past Surgical History:  Procedure Laterality Date  . ABDOMINAL HYSTERECTOMY  1984  . BACK SURGERY  2006, 2008   lower back, fusion in l5-s1  . CHOLECYSTECTOMY  2003  . ERCP W/ SPHINCTEROTOMY AND BALLOON DILATION  2005   pancreatitis   . LUMBAR LAMINECTOMY/DECOMPRESSION MICRODISCECTOMY N/A 05/31/2012   Procedure: MICRO LUMBAR DECOMPRESSION L4 - L5, L5 - S1 ;  Surgeon: Johnn Hai, MD;  Location: [facility];  Service: Orthopedics;  Laterality: N/A;  . LUMBAR LAMINECTOMY/DECOMPRESSION MICRODISCECTOMY Right 02/06/2014   Procedure: LUMBAR DECOMPRESSION L3-L4, REDO LUMBAR DECOMPRESSION L4-L5 ON THE RIGHT;  Surgeon: Johnn Hai, MD;  Location: [facility];  Service: Orthopedics;  Laterality: Right;  . NECK SURGERY  2001,2007   herniated disc- cervical fusion(limited side to side mobility)  . SHOULDER ARTHROSCOPY Bilateral 2010, 2011   debridement    OB History    No data available       Home Medications    Prior to Admission medications   Medication Sig Start Date End Date Taking? Authorizing Provider  ascorbic acid (VITAMIN C) 1000 MG tablet Take 1,000 mg by mouth daily.    [provider]  Calcium Carb-Cholecalciferol (CALCIUM 600 + D PO) Take 1 tablet by mouth daily.    [provider]  docusate sodium (COLACE) 100 MG capsule Take 1 capsule (100 mg total) by mouth 2 (two) times daily as needed for mild constipation. 02/06/14   Susa Day, MD  fexofenadine (ALLEGRA) 180 MG tablet Take 180 mg by mouth daily.    [provider]  fish oil-omega-3 fatty acids 1000 MG capsule Take 1 g by mouth daily.     [provider]  gabapentin (NEURONTIN) 300 MG capsule Take 1  capsule (300 mg total) by mouth 3 (three) times daily. Take 2 capsules (600mg ) at breakfast, one at lunch (300mg ), and 2 tablets (600mg ) at bedtime. 08/05/16   Gareth Morgan, MD  HYDROcodone-acetaminophen (NORCO) 10-325 MG tablet Take 1 tablet by mouth every 6 (six) hours as needed for moderate pain.     [provider]  iron polysaccharides (NIFEREX) 150 MG capsule Take 150 mg by mouth daily.    [provider]  lidocaine (LIDODERM) 5 % Place 1 patch onto the skin daily as needed (pain). Remove & Discard patch within 12 hours or as directed by MD    [provider]    methocarbamol (ROBAXIN) 500 MG tablet Take 1 tablet (500 mg total) by mouth 3 (three) times daily. Patient taking differently: Take by mouth 3 (three) times daily as needed for muscle spasms.  02/06/14   Susa Day, MD  methylPREDNISolone (MEDROL DOSEPAK) 4 MG TBPK tablet Day 1: 8 mg PO before breakfast, 4 mg after lunch and after dinner, and 8 mg at bedtime  Day 2: 4 mg PO before breakfast, after lunch, and after dinner and 8 mg at bedtime  Day 3: 4 mg PO before breakfast, after lunch, after dinner, and at bedtime  Day 4: 4 mg PO before breakfast, after lunch, and at bedtime  Day 5: 4 mg PO before breakfast and at bedtime  Day 6: 4 mg PO before breakfast 08/05/16   Gareth Morgan, MD  mirabegron ER (MYRBETRIQ) 50 MG TB24 tablet Take 50 mg by mouth daily.    [provider]  pantoprazole (PROTONIX) 40 MG tablet Take 40 mg by mouth daily.    [provider]  polyethylene glycol (MIRALAX / GLYCOLAX) packet Take 17 g by mouth daily.    [provider]  POTASSIUM GLUCONATE PO Take 1 tablet by mouth daily.    [provider]  predniSONE (DELTASONE) 20 MG tablet 3 Tabs PO Days 1-3, then 2 tabs PO Days 4-6, then 1 tab PO Day 7-9, then Half Tab PO Day 10-12 05/18/16   Carlisle Cater, PA-C  topiramate (TOPAMAX) 25 MG tablet Take 75 mg by mouth at bedtime.    [provider]  traZODone (DESYREL) 50 MG tablet Take 50 mg by mouth at bedtime.    [provider]    Family History No family history on file.  Social History Social History  Substance Use Topics  . Smoking status: Former Smoker    Packs/day: 0.50    Years: 3.00    Types: Cigarettes    Quit date: 02/02/1972  . Smokeless tobacco: Never Used  . Alcohol use No     Allergies   Patient has no known allergies.   Review of Systems Review of Systems  Constitutional: Negative for fever.  HENT: Negative for sore throat.   Eyes: Negative for visual disturbance.  Respiratory:  Negative for cough and shortness of breath.   Cardiovascular: Negative for chest pain.  Gastrointestinal: Negative for abdominal pain.  Genitourinary: Negative for difficulty urinating.  Musculoskeletal: Positive for back pain and neck pain.  Skin: Negative for rash.  Neurological: Positive for numbness. Negative for syncope and headaches. Weakness: feels like having trouble using hands over last several months.     Physical Exam Updated Vital Signs BP 117/62   Pulse 69   Temp 98 F (36.7 C) (Oral)   Resp 20   Ht 5\' 6"  (1.676 m)   Wt 69.9 kg (154 lb)   SpO2  100%   BMI 24.86 kg/m   Physical Exam  Constitutional: She is oriented to person, place, and time. She appears well-developed and well-nourished. No distress.  HENT:  Head: Normocephalic and atraumatic.  Eyes: Conjunctivae and EOM are normal.  Neck: Normal range of motion.  Cardiovascular: Normal rate, regular rhythm, normal heart sounds and intact distal pulses.  Exam reveals no gallop and no friction rub.   No murmur heard. Pulmonary/Chest: Effort normal and breath sounds normal. No respiratory distress. She has no wheezes. She has no rales.  Abdominal: Soft. She exhibits no distension. There is no tenderness. There is no guarding.  Musculoskeletal: She exhibits no edema.       Cervical back: She exhibits tenderness and bony tenderness.       Thoracic back: She exhibits tenderness.  Normal finger abduction, grip strength, opponens, wrist extension Reports normal sensation at this time  Neurological: She is alert and oriented to person, place, and time.  Skin: Skin is warm and dry. No rash noted. She is not diaphoretic. No erythema.  Nursing note and vitals reviewed.    ED Treatments / Results  Labs (all labs ordered are listed, but only abnormal results are displayed) Labs Reviewed - No data to display  EKG  EKG Interpretation None       Radiology No results found.  Procedures Procedures (including  critical care time)  Medications Ordered in ED Medications  ketorolac (TORADOL) injection 60 mg (60 mg Intramuscular Given 08/05/16 2305)  dexamethasone (DECADRON) tablet 10 mg (10 mg Oral Given 08/05/16 2305)     Initial Impression / Assessment and Plan / ED Course  I have reviewed the triage vital signs and the nursing notes.  Pertinent labs & imaging results that were available during my care of the patient were reviewed by me and considered in my medical decision making (see chart for details).     66yo female presents with concern for worsening chronic neck pain and back pain, pain and numbness intermittently radiating down the left arm.  No signs of significant weakness, no sign for need for emergent spinal surgery.  Patient has a normal neurologic exam and denies any urinary retention or overflow incontinence, stool incontinence, saddle anesthesia, fever, trauma, chronic steroid use or immunocompromise and have low suspicion suspicion for spinal cord compression, epidural abscess, or vertebral osteomyelitis.  Patient with worsening cervical radiculopathy, needs continued follow up with pain management and NSU.  Increased dose of gabapentin and gave medrol dose pack. Patient discharged in stable condition with understanding of reasons to return.   Final Clinical Impressions(s) / ED Diagnoses   Final diagnoses:  Cervical disc disease  Cervical radiculopathy    New Prescriptions Discharge Medication List as of 08/05/2016 11:00 PM    START taking these medications   Details  methylPREDNISolone (MEDROL DOSEPAK) 4 MG TBPK tablet Day 1: 8 mg PO before breakfast, 4 mg after lunch and after dinner, and 8 mg at bedtime  Day 2: 4 mg PO before breakfast, after lunch, and after dinner and 8 mg at bedtime  Day 3: 4 mg PO before breakfast, after lunch, after dinner, and at bedtime   Day 4: 4 mg PO before breakfast, after lunch, and at bedtime  Day 5: 4 mg PO before breakfast and at  bedtime  Day 6: 4 mg PO before breakfast, Print         Gareth Morgan, MD 08/06/16 1324

## 2016-09-25 ENCOUNTER — Emergency Department (HOSPITAL_COMMUNITY)
Admission: EM | Admit: 2016-09-25 | Discharge: 2016-09-25 | Disposition: A | Discharge status: Home / Self Care | Payer: Medicare Other | Attending: Emergency Medicine | Admitting: Emergency Medicine

## 2016-09-25 ENCOUNTER — Encounter (HOSPITAL_COMMUNITY): Payer: Self-pay

## 2016-09-25 ENCOUNTER — Emergency Department (HOSPITAL_COMMUNITY): Payer: Medicare Other

## 2016-09-25 DIAGNOSIS — M5412 Radiculopathy, cervical region: Secondary | ICD-10-CM | POA: Insufficient documentation

## 2016-09-25 DIAGNOSIS — M25511 Pain in right shoulder: Secondary | ICD-10-CM | POA: Insufficient documentation

## 2016-09-25 DIAGNOSIS — Z79899 Other long term (current) drug therapy: Secondary | ICD-10-CM | POA: Insufficient documentation

## 2016-09-25 DIAGNOSIS — Z87891 Personal history of nicotine dependence: Secondary | ICD-10-CM | POA: Insufficient documentation

## 2016-09-25 DIAGNOSIS — M542 Cervicalgia: Secondary | ICD-10-CM | POA: Diagnosis present

## 2016-09-25 MED ORDER — HYDROMORPHONE HCL 1 MG/ML IJ SOLN
1.0000 mg | Freq: Once | INTRAMUSCULAR | Status: AC
  Administered 2016-09-25: 1 mg via INTRAVENOUS
  Filled 2016-09-25: qty 1

## 2016-09-25 MED ORDER — PREDNISONE 20 MG PO TABS: ORAL_TABLET | ORAL | 0 refills | Status: DC

## 2016-09-25 MED ORDER — METHOCARBAMOL 500 MG PO TABS: 1000.0000 mg | ORAL_TABLET | Freq: Four times a day (QID) | ORAL | 0 refills | Status: DC

## 2016-09-25 MED ORDER — METHYLPREDNISOLONE SODIUM SUCC 125 MG IJ SOLR
125.0000 mg | Freq: Once | INTRAMUSCULAR | Status: AC
  Administered 2016-09-25: 125 mg via INTRAVENOUS
  Filled 2016-09-25: qty 2

## 2016-09-25 MED ORDER — LORAZEPAM 2 MG/ML IJ SOLN
1.0000 mg | Freq: Once | INTRAMUSCULAR | Status: AC | PRN
  Administered 2016-09-25: 1 mg via INTRAVENOUS
  Filled 2016-09-25 (×2): qty 1

## 2016-09-25 NOTE — Discharge Instructions (Signed)
Please read and follow all provided instructions.  Your diagnoses today include:  1. Cervical radiculopathy     Tests performed today include:  Vital signs - see below for your results today  MRI - shows stable hardware and bulges in your neck  Medications prescribed:   Robaxin (methocarbamol) - muscle relaxer medication  Increase home dose to 1000mg  4 times per day until pain is tolerable  DO NOT drive or perform any activities that require you to be awake and alert because this medicine can make you drowsy.    Prednisone - steroid medicine   It is best to take this medication in the morning to prevent sleeping problems. If you are diabetic, monitor your blood sugar closely and stop taking Prednisone if blood sugar is over 300. Take with food to prevent stomach upset.   Take any prescribed medications only as directed.  Home care instructions:   Follow any educational materials contained in this packet  Please rest, use ice or heat on your back for the next several days  Do not lift, push, pull anything more than 10 pounds for the next week  Follow-up instructions: Please follow-up with your surgeon in the next 1 week for further evaluation of your symptoms.   Return instructions:  SEEK IMMEDIATE MEDICAL ATTENTION IF YOU HAVE:  New numbness, tingling, weakness, or problem with the use of your arms or legs  Severe back pain not relieved with medications  Loss control of your bowels or bladder  Increasing pain in any areas of the body (such as chest or abdominal pain)  Shortness of breath, dizziness, or fainting.   Worsening nausea (feeling sick to your stomach), vomiting, fever, or sweats  Any other emergent concerns regarding your health   Additional Information:  Your vital signs today were: BP 114/63    Pulse 75    Temp 97.9 F (36.6 C) (Oral)    Resp 16    SpO2 95%  If your blood pressure (BP) was elevated above 135/85 this visit, please have this  repeated by your doctor within one month. --------------

## 2016-09-25 NOTE — ED Provider Notes (Signed)
[facility] DEPT Provider Note   CSN: [id number] Arrival date & time: 09/25/16  1032     History   Chief Complaint Chief Complaint  Patient presents with  . Neck Pain  . Shoulder Pain    HPI [name] is a 66 y.o. female with a h/o of ACDF C4-6 and who presents to the emergency department with a chief complaint of fall. She reports she was standing on a stepladder when she lost her balance and fell onto her right side. She states her right shoulder and the right side of her head struck the ground. She reports that she was able to stand up within a few minutes and was ambulatory. She denies syncope, emesis, nausea, dizziness, or lightheadedness. She reports numbness down her right arm into her right fingers that began after the fall. She states that this morning she began having tingling and numbness in her left hand so she came to the ED for evaluation.   Neurosurgeon: Dr. Ronnald Ramp   The history is provided by the patient. No language interpreter was used.    Past Medical History:  Diagnosis Date  . Anemia   . Anxiety 2000  . Depression 2000  . GERD (gastroesophageal reflux disease)   . Headache(784.0)   . PONV (postoperative nausea and vomiting)   . Seasonal allergies     Patient Active Problem List   Diagnosis Date Noted  . Spinal stenosis at L4-L5 level 02/06/2014  . Spinal stenosis, lumbar region, without neurogenic claudication 05/31/2012  . HEMORRHAGE OF RECTUM AND ANUS 11/20/2008  . PANCREATITIS 11/20/2008  . [place] DISEASE, CERVICAL 11/14/2008  . HEADACHE, CHRONIC 11/14/2008  . DEPRESSION, HX OF 11/14/2008    Past Surgical History:  Procedure Laterality Date  . ABDOMINAL HYSTERECTOMY  1984  . BACK SURGERY  2006, 2008   lower back, fusion in l5-s1  . CHOLECYSTECTOMY  2003  . ERCP W/ SPHINCTEROTOMY AND BALLOON DILATION  2005   pancreatitis   . LUMBAR LAMINECTOMY/DECOMPRESSION MICRODISCECTOMY N/A 05/31/2012   Procedure: MICRO LUMBAR DECOMPRESSION L4 - L5,  L5 - S1 ;  Surgeon: Johnn Hai, MD;  Location: [facility];  Service: Orthopedics;  Laterality: N/A;  . LUMBAR LAMINECTOMY/DECOMPRESSION MICRODISCECTOMY Right 02/06/2014   Procedure: LUMBAR DECOMPRESSION L3-L4, REDO LUMBAR DECOMPRESSION L4-L5 ON THE RIGHT;  Surgeon: Johnn Hai, MD;  Location: [facility];  Service: Orthopedics;  Laterality: Right;  . NECK SURGERY  2001,2007   herniated disc- cervical fusion(limited side to side mobility)  . SHOULDER ARTHROSCOPY Bilateral 2010, 2011   debridement    OB History    No data available       Home Medications    Prior to Admission medications   Medication Sig Start Date End Date Taking? Authorizing Provider  ascorbic acid (VITAMIN C) 1000 MG tablet Take 1,000 mg by mouth daily.   Yes [provider]  Calcium Carb-Cholecalciferol (CALCIUM 600 + D PO) Take 1 tablet by mouth daily.   Yes [provider]  cholecalciferol (VITAMIN D) 1000 units tablet Take 1,000 Units by mouth daily.   Yes [provider]  docusate sodium (COLACE) 100 MG capsule Take 1 capsule (100 mg total) by mouth 2 (two) times daily as needed for mild constipation. 02/06/14  Yes Susa Day, MD  DULoxetine (CYMBALTA) 60 MG capsule Take 60 mg by mouth daily.   Yes [provider]  fexofenadine (ALLEGRA) 180 MG tablet Take 180 mg by mouth daily.   Yes [provider]  fish oil-omega-3 fatty acids 1000 MG capsule Take 1 g by mouth daily.    Yes [provider]  gabapentin (NEURONTIN) 300 MG capsule Take 1 capsule (300 mg total) by mouth 3 (three) times daily. Take 2 capsules (600mg ) at breakfast, one at lunch (300mg ), and 2 tablets (600mg ) at bedtime. Patient taking differently: Take 300-600 mg by mouth See admin instructions. Take 2 capsules (600mg ) at breakfast, one at lunch (300mg ), and 2 tablets (600mg ) at bedtime. 08/05/16  Yes Gareth Morgan, MD  HYDROcodone-acetaminophen (NORCO) 10-325 MG tablet Take 1 tablet by mouth every  6 (six) hours as needed for moderate pain.    Yes [provider]  iron polysaccharides (NIFEREX) 150 MG capsule Take 150 mg by mouth daily.   Yes [provider]  lidocaine (LIDODERM) 5 % Place 1-3 patches onto the skin daily as needed (pain). Remove & Discard patch within 12 hours or as directed by MD    Yes [provider]  methocarbamol (ROBAXIN) 500 MG tablet Take 1 tablet (500 mg total) by mouth 3 (three) times daily. Patient taking differently: Take 500 mg by mouth 3 (three) times daily as needed for muscle spasms.  02/06/14  Yes Susa Day, MD  mirabegron ER (MYRBETRIQ) 50 MG TB24 tablet Take 50 mg by mouth daily.   Yes [provider]  Multiple Vitamins-Minerals (MULTIVITAMIN PO) Take 1 tablet by mouth daily.   Yes [provider]  pantoprazole (PROTONIX) 40 MG tablet Take 40 mg by mouth 2 (two) times daily.    Yes [provider]  polyethylene glycol (MIRALAX / GLYCOLAX) packet Take 17 g by mouth daily.   Yes [provider]  POTASSIUM GLUCONATE PO Take 595 mg by mouth daily.    Yes [provider]  topiramate (TOPAMAX) 25 MG tablet Take 75 mg by mouth at bedtime.   Yes [provider]  traZODone (DESYREL) 50 MG tablet Take 50 mg by mouth at bedtime.   Yes [provider]    Family History No family history on file.  Social History Social History  Substance Use Topics  . Smoking status: Former Smoker    Packs/day: 0.50    Years: 3.00    Types: Cigarettes    Quit date: 02/02/1972  . Smokeless tobacco: Never Used  . Alcohol use No     Allergies   Patient has no known allergies.   Review of Systems Review of Systems  Constitutional: Negative for activity change, chills and fever.  Respiratory: Negative for shortness of breath.   Cardiovascular: Negative for chest pain.  Gastrointestinal: Negative for abdominal pain, nausea and vomiting.  Musculoskeletal: Positive for arthralgias,  back pain, myalgias and neck pain. Negative for gait problem.  Skin: Negative for rash.  Neurological: Positive for numbness. Negative for dizziness, syncope, weakness and headaches.     Physical Exam Updated Vital Signs BP 114/63   Pulse 75   Temp 97.9 F (36.6 C) (Oral)   Resp 16   SpO2 95%   Physical Exam  Constitutional: She is oriented to person, place, and time. She appears well-developed and well-nourished. No distress.  HENT:  Head: Normocephalic.  Eyes: Pupils are equal, round, and reactive to light. Conjunctivae and EOM are normal.  Neck: Neck supple.  Cardiovascular: Normal rate, regular rhythm and normal heart sounds.  Exam reveals no gallop and no friction rub.   No murmur heard. Pulmonary/Chest: Effort normal and breath sounds normal. No respiratory distress. She has no wheezes. She  has no rales.  Abdominal: Soft. She exhibits no distension.  Neurological: She is alert and oriented to person, place, and time.  Cranial nerves 2-12 intact. Finger-to-nose is normal. 5/5 motor strength of the bilateral upper and lower extremities. Moves all four extremities. Negative Romberg. No pronator drift. Ambulatory without difficulty. 5 out of 5 and equal strength of the large muscle groups of the bilateral upper and lower extremities.   Decreased sensation to the radial and ulnar nerve distribution and the right upper extremity. Decreased sensation along the peroneal nerve distribution in the right lower extremity.  Skin: Skin is warm. No rash noted. She is not diaphoretic.  Psychiatric: Her behavior is normal.  Nursing note and vitals reviewed.  ED Treatments / Results  Labs (all labs ordered are listed, but only abnormal results are displayed) Labs Reviewed - No data to display  EKG  EKG Interpretation None       Radiology No results found.  Procedures Procedures (including critical care time)  Medications Ordered in ED Medications  HYDROmorphone (DILAUDID)  injection 1 mg (1 mg Intravenous Given 09/25/16 1450)  LORazepam (ATIVAN) injection 1 mg (1 mg Intravenous Given 09/25/16 1636)     Initial Impression / Assessment and Plan / ED Course  I have reviewed the triage vital signs and the nursing notes.  Pertinent labs & imaging results that were available during my care of the patient were reviewed by me and considered in my medical decision making (see chart for details).     66 year old female with a previous ACDF C4-6 and bulging discs presenting with numbness to the right upper extremity that began immediately after a fall from a stepladder onto her right side. New left-sided numbness and tingling that began this AM. Decreased light touch noted along the radial and ulnar distribution of the right upper extremity. Discussed the patient with Dr. Eulis Foster. Will order a cervical MRI to assess for spinal cord compression. Pain controlled in the ED. MRI pending with PRN ativan. Patient care transferred to PA Geiple at the end of my shift. Patient presentation, ED course, and plan of care discussed with review of all pertinent labs and imaging. Please see his/her note for further details regarding further ED course and disposition.   Final Clinical Impressions(s) / ED Diagnoses   Final diagnoses:  None    New Prescriptions New Prescriptions   No medications on file     Joanne Gavel, PA-C 09/25/16 1727    Daleen Bo, MD 09/26/16 1018

## 2016-09-25 NOTE — ED Notes (Signed)
Completion on MRI clicked off by accident, Pt has not yet gone to MRI

## 2016-09-25 NOTE — ED Provider Notes (Signed)
6:45 PM Handoff from McDonald PA-C at shift change.   Patient with history of cervical fusion, recent fall, worsening right-sided cervical radiculopathy. Currently awaiting MRI of cervical spine. This was negative for new acute findings. Patient has 2 areas of disc bulging, more prominent on the right, which likely explains her symptoms.  Patient to be discharged home on her usual course of hydrocodone 10-325 mg which was recently changed by her pain management physician from oxycodone. Will start on course of prednisone. Also will increase Robaxin dose to 1000 mg while pain is severe.  Patient counseled on use of narcotic pain medications. Counseled not to combine these medications with others containing tylenol. Urged not to drink alcohol, drive, or perform any other activities that requires focus while taking these medications. The patient verbalizes understanding and agrees with the plan.  Patient encouraged to return with new weakness, incontinence symptoms, fevers, other concerns. She will follow-up withher neurosurgeon when able.   BP 114/63   Pulse 75   Temp 97.9 F (36.6 C) (Oral)   Resp 16   SpO2 95%     Carlisle Cater, PA-C 09/25/16 1850    Noemi Chapel, MD 10/05/16 [phone]

## 2016-09-25 NOTE — ED Triage Notes (Signed)
Pt reports hx of disc issues in neck and fell off stool last night landing on right shoulder. Pt reports since then she has increased neck pain that radiates to right arm and numbness in arm.

## 2016-09-25 NOTE — ED Notes (Signed)
Patient transported to MRI 

## 2017-01-11 ENCOUNTER — Ambulatory Visit: Payer: Self-pay | Admitting: Podiatry

## 2017-01-18 ENCOUNTER — Ambulatory Visit: Payer: Medicare Other | Admitting: Podiatry

## 2017-01-18 ENCOUNTER — Ambulatory Visit: Payer: Medicare Other

## 2017-01-18 ENCOUNTER — Encounter: Payer: Self-pay | Admitting: Podiatry

## 2017-01-18 ENCOUNTER — Ambulatory Visit (INDEPENDENT_AMBULATORY_CARE_PROVIDER_SITE_OTHER): Payer: Medicare Other

## 2017-01-18 VITALS — BP 102/53 | HR 82 | Resp 16

## 2017-01-18 DIAGNOSIS — M7750 Other enthesopathy of unspecified foot: Secondary | ICD-10-CM | POA: Diagnosis not present

## 2017-01-18 DIAGNOSIS — M722 Plantar fascial fibromatosis: Secondary | ICD-10-CM

## 2017-01-18 DIAGNOSIS — M2012 Hallux valgus (acquired), left foot: Secondary | ICD-10-CM

## 2017-01-18 DIAGNOSIS — M2011 Hallux valgus (acquired), right foot: Secondary | ICD-10-CM

## 2017-01-18 DIAGNOSIS — M779 Enthesopathy, unspecified: Secondary | ICD-10-CM

## 2017-01-18 DIAGNOSIS — M778 Other enthesopathies, not elsewhere classified: Secondary | ICD-10-CM

## 2017-01-18 MED ORDER — METHYLPREDNISOLONE 4 MG PO TBPK: ORAL_TABLET | ORAL | 0 refills | Status: DC

## 2017-01-18 MED ORDER — MELOXICAM 15 MG PO TABS: 15.0000 mg | ORAL_TABLET | Freq: Every day | ORAL | 3 refills | Status: DC

## 2017-01-18 NOTE — Progress Notes (Signed)
Subjective:  Patient ID: [name], female    DOB: 1951/01/22,  MRN: [id number] HPI Chief Complaint  Patient presents with  . Foot Pain    Plantar and posterior heel left - aching x 6 months, AM pain  . 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 female presents with the above complaint.     Past Medical History:  Diagnosis Date  . Anemia   . Anxiety 2000  . Depression 2000  . GERD (gastroesophageal reflux disease)   . Headache(784.0)   . PONV (postoperative nausea and vomiting)   . Seasonal allergies    Past Surgical History:  Procedure Laterality Date  . ABDOMINAL HYSTERECTOMY  1984  . BACK SURGERY  2006, 2008   lower back, fusion in l5-s1  . CHOLECYSTECTOMY  2003  . ERCP W/ SPHINCTEROTOMY AND BALLOON DILATION  2005   pancreatitis   . LUMBAR LAMINECTOMY/DECOMPRESSION MICRODISCECTOMY N/A 05/31/2012   Procedure: MICRO LUMBAR DECOMPRESSION L4 - L5, L5 - S1 ;  Surgeon: Johnn Hai, MD;  Location: [facility];  Service: Orthopedics;  Laterality: N/A;  . LUMBAR LAMINECTOMY/DECOMPRESSION MICRODISCECTOMY Right 02/06/2014   Procedure: LUMBAR DECOMPRESSION L3-L4, REDO LUMBAR DECOMPRESSION L4-L5 ON THE RIGHT;  Surgeon: Johnn Hai, MD;  Location: [facility];  Service: Orthopedics;  Laterality: Right;  . NECK SURGERY  2001,2007   herniated disc- cervical fusion(limited side to side mobility)  . SHOULDER ARTHROSCOPY Bilateral 2010, 2011   debridement    Current Outpatient Medications:  .  estrogens, conjugated, (PREMARIN) 0.45 MG tablet, Take 0.45 mg by mouth., Disp: , Rfl:  .  senna-docusate (SENOKOT-S) 8.6-50 MG tablet, Take by mouth., Disp: , Rfl:  .  acyclovir (ZOVIRAX) 200 MG capsule, TAKE ONE CAPSULE BY MOUTH 5 TIMES DAILY, Disp: , Rfl: 8 .  ascorbic acid (VITAMIN C) 1000 MG tablet, Take 1,000 mg by mouth daily., Disp: , Rfl:  .  buPROPion (WELLBUTRIN  XL) 150 MG 24 hr tablet, , Disp: , Rfl: 0 .  Calcium Carb-Cholecalciferol (CALCIUM 600 + D PO), Take 1 tablet by mouth daily., Disp: , Rfl:  .  Calcium Carbonate-Vitamin D (CALCIUM HIGH POTENCY/VITAMIN D) 600-200 MG-UNIT TABS, Take by mouth., Disp: , Rfl:  .  cholecalciferol (VITAMIN D) 1000 units tablet, Take 1,000 Units by mouth daily., Disp: , Rfl:  .  docusate sodium (COLACE) 100 MG capsule, Take 1 capsule (100 mg total) by mouth 2 (two) times daily as needed for mild constipation., Disp: 20 capsule, Rfl: 1 .  DULoxetine (CYMBALTA) 60 MG capsule, Take 60 mg by mouth daily., Disp: , Rfl:  .  fexofenadine (ALLEGRA) 180 MG tablet, Take 180 mg by mouth daily., Disp: , Rfl:  .  fish oil-omega-3 fatty acids 1000 MG capsule, Take 1 g by mouth daily. , Disp: , Rfl:  .  gabapentin (NEURONTIN) 300 MG capsule, Take 1 capsule (300 mg total) by mouth 3 (three) times daily. Take 2 capsules (600mg ) at breakfast, one at lunch (300mg ), and 2 tablets (600mg ) at bedtime. (Patient taking differently: Take 300-600 mg by mouth See admin instructions. Take 2 capsules (600mg ) at breakfast, one at lunch (300mg ), and 2 tablets (600mg ) at bedtime.), Disp: 30 capsule, Rfl: 0 .  HYDROcodone-acetaminophen (NORCO) 10-325 MG tablet, Take 1 tablet by mouth every 6 (six) hours as needed for moderate pain. , Disp: ,  Rfl:  .  iron polysaccharides (NIFEREX) 150 MG capsule, Take 150 mg by mouth daily., Disp: , Rfl:  .  levofloxacin (LEVAQUIN) 500 MG tablet, , Disp: , Rfl: 0 .  lidocaine (LIDODERM) 5 %, Place 1-3 patches onto the skin daily as needed (pain). Remove & Discard patch within 12 hours or as directed by MD , Disp: , Rfl:  .  methocarbamol (ROBAXIN) 750 MG tablet, , Disp: , Rfl: 0 .  mirabegron ER (MYRBETRIQ) 50 MG TB24 tablet, Take 50 mg by mouth daily., Disp: , Rfl:  .  Multiple Vitamins-Minerals (MULTIVITAMIN PO), Take 1 tablet by mouth daily., Disp: , Rfl:  .  ondansetron (ZOFRAN) 4 MG tablet, TAKE ONE TABLET (4 MG  TOTAL) BY MOUTH 3 (THREE) TIMES A DAY AS NEEDED., Disp: , Rfl: 3 .  pantoprazole (PROTONIX) 40 MG tablet, Take 40 mg by mouth 2 (two) times daily. , Disp: , Rfl:  .  polyethylene glycol (MIRALAX / GLYCOLAX) packet, Take 17 g by mouth daily., Disp: , Rfl:  .  POTASSIUM GLUCONATE PO, Take 595 mg by mouth daily. , Disp: , Rfl:  .  predniSONE (DELTASONE) 20 MG tablet, 3 Tabs PO Days 1-3, then 2 tabs PO Days 4-6, then 1 tab PO Day 7-9, then Half Tab PO Day 10-12, Disp: 20 tablet, Rfl: 0 .  topiramate (TOPAMAX) 25 MG tablet, Take 75 mg by mouth at bedtime., Disp: , Rfl:  .  traZODone (DESYREL) 50 MG tablet, Take 50 mg by mouth at bedtime., Disp: , Rfl:   No Known Allergies Review of Systems  All other systems reviewed and are negative.  Objective:  There were no vitals filed for this visit.  General: Well developed, nourished, in no acute distress, alert and oriented x3   Dermatological: Skin is warm, dry and supple bilateral. Nails x 10 are well maintained; remaining integument appears unremarkable at this time. There are no open sores, no preulcerative lesions, no rash or signs of infection present.  Vascular: Dorsalis Pedis artery and Posterior Tibial artery pedal pulses are 2/4 bilateral with immedate capillary fill time. Pedal hair growth present. No varicosities and no lower extremity edema present bilateral.   Neruologic: Grossly intact via light touch bilateral. Vibratory intact via tuning fork bilateral. Protective threshold with Semmes Wienstein monofilament intact to all pedal sites bilateral. Patellar and Achilles deep tendon reflexes 2+ bilateral. No Babinski or clonus noted bilateral.   Musculoskeletal: No gross boney pedal deformities bilateral. No pain, crepitus, or limitation noted with foot and ankle range of motion bilateral. Muscular strength 5/5 in all groups tested bilateral.  Gait: Unassisted, Nonantalgic.    Radiographs:  3 views of the bilateral foot today  demonstrates mild to moderate hallux abductovalgus deformities foot appears to be relatively rectus in nature.  Small plantar distally oriented calcaneal heel spur soft tissue increase in density of plantar fascial calcaneal insertion sites.  No signs of fracture.  Assessment & Plan:   Assessment: Plantar fasciitis bilateral.  Hallux abductovalgus deformity bilateral.  Capsulitis of the first metatarsophalangeal joints bilateral.  Plan: We discussed etiology pathology conservative versus surgical therapies.  We discussed appropriate shoe gear stretching exercises ice therapy and shoe gear modifications.  Start her on a Medrol Dosepak and followed by meloxicam.  I injected the bilateral heels today with 20 mg of Kenalog and 5 mg of Marcaine to each heel after sterile Betadine skin prep and verbal consent.  She tolerated procedure well without complications.  I also injected similarly  dexamethasone and local anesthetic a total of 4 mg to each metatarsophalangeal joint hallux bilateral.  She tolerated this well.  We also dispensed a plantar fascial brace at night splint left.  I will follow-up with her for possible surgery or orthotics in the near future.     Bellamie Turney T. [place], [place]

## 2017-01-18 NOTE — Patient Instructions (Signed)

## 2017-02-15 ENCOUNTER — Ambulatory Visit: Payer: Medicare Other | Admitting: Podiatry

## 2017-02-15 DIAGNOSIS — M7750 Other enthesopathy of unspecified foot: Secondary | ICD-10-CM

## 2017-02-15 DIAGNOSIS — M779 Enthesopathy, unspecified: Secondary | ICD-10-CM

## 2017-02-15 DIAGNOSIS — M722 Plantar fascial fibromatosis: Secondary | ICD-10-CM

## 2017-02-15 DIAGNOSIS — M778 Other enthesopathies, not elsewhere classified: Secondary | ICD-10-CM

## 2017-02-15 NOTE — Progress Notes (Signed)
She presents today states that her left heel is still sore but her first metatarsophalangeal joint bilaterally feeling much better.  She states that her second metatarsophalangeal joint of her left foot is painful.  Objective: Vital signs are stable she is alert and oriented x3.  Pulses are palpable.  Neurologic sensorium is intact.  Deep tendon reflexes are intact.  Muscle strength is normal.  Hallux valgus is resulting in a basically elongated second metatarsal and there is some contracture deformity at the level of the second metatarsal phalangeal joint consistent with capsulitis and hammertoe deformity.  Tenderness on palpation of the second metatarsal phalangeal joints consistent with capsulitis.  She has pain on palpation medial plantar tubercle of the left heel.  Assessment: Capsulitis second metatarsophalangeal joint of the left foot.  Plantar fasciitis left foot.  Injected both sites today with 20 mg of Kenalog and 5 mg of Marcaine after sterile Betadine skin prep and verbal consent.  She tolerated procedure well will follow up with me in 1 month we may need to consider orthotics.

## 2017-03-22 ENCOUNTER — Ambulatory Visit: Payer: Medicare Other | Admitting: Podiatry

## 2017-12-27 ENCOUNTER — Ambulatory Visit: Payer: Medicare Other | Admitting: Neurology

## 2018-03-13 ENCOUNTER — Other Ambulatory Visit: Payer: Self-pay

## 2018-03-13 ENCOUNTER — Ambulatory Visit: Payer: Medicare Other | Attending: Orthopaedic Surgery | Admitting: Physical Therapy

## 2018-03-13 DIAGNOSIS — G8929 Other chronic pain: Secondary | ICD-10-CM

## 2018-03-13 DIAGNOSIS — M25561 Pain in right knee: Secondary | ICD-10-CM | POA: Insufficient documentation

## 2018-03-13 NOTE — Therapy (Signed)
[facility] [place], [place], [address] Phone: [phone]   Fax:  [phone]  Physical Therapy Evaluation  Patient Details  Name: [name] MRN: [id number] Date of Birth: Feb 16, 1950 Referring Provider (PT): Jasper Loser   Encounter Date: 03/13/2018  PT End of Session - 03/13/18 1046    Visit Number  1    Number of Visits  8    Date for PT Re-Evaluation  04/24/18    PT Start Time  0845    PT Stop Time  0929    PT Time Calculation (min)  44 min    Activity Tolerance  Patient tolerated treatment well    Behavior During Therapy  [facility] for tasks assessed/performed       Past Medical History:  Diagnosis Date  . Anemia   . Anxiety 2000  . Depression 2000  . GERD (gastroesophageal reflux disease)   . Headache(784.0)   . PONV (postoperative nausea and vomiting)   . Seasonal allergies     Past Surgical History:  Procedure Laterality Date  . ABDOMINAL HYSTERECTOMY  1984  . BACK SURGERY  2006, 2008   lower back, fusion in l5-s1  . CHOLECYSTECTOMY  2003  . ERCP W/ SPHINCTEROTOMY AND BALLOON DILATION  2005   pancreatitis   . LUMBAR LAMINECTOMY/DECOMPRESSION MICRODISCECTOMY N/A 05/31/2012   Procedure: MICRO LUMBAR DECOMPRESSION L4 - L5, L5 - S1 ;  Surgeon: Johnn Hai, MD;  Location: [facility];  Service: Orthopedics;  Laterality: N/A;  . LUMBAR LAMINECTOMY/DECOMPRESSION MICRODISCECTOMY Right 02/06/2014   Procedure: LUMBAR DECOMPRESSION L3-L4, REDO LUMBAR DECOMPRESSION L4-L5 ON THE RIGHT;  Surgeon: Johnn Hai, MD;  Location: [facility];  Service: Orthopedics;  Laterality: Right;  . NECK SURGERY  2001,2007   herniated disc- cervical fusion(limited side to side mobility)  . SHOULDER ARTHROSCOPY Bilateral 2010, 2011   debridement    There were no vitals filed for this visit.   Subjective Assessment - 03/13/18 1022    Subjective  The patient tripped and fell 2 years ago and hit her right knee.  Her knee has hurt ever  since.  She reports a recent injection, however, has been very helpful.  Her pain is a 4/10 today but rises with squatting.  Marland Kitchen  Rest decreases her pain.    Pertinent History  3 cervical and 3 lumbar surgeries.    Patient Stated Goals  Get back to normal and be able to squat.    Currently in Pain?  Yes    Pain Score  4     Pain Location  Knee    Pain Orientation  Right    Pain Descriptors / Indicators  Aching    Pain Type  Chronic pain    Pain Onset  More than a month ago    Pain Frequency  Constant    Aggravating Factors   See above.    Pain Relieving Factors  See above.         [facility] PT Assessment - 03/13/18 0001      Assessment   Medical Diagnosis  Right knee pain.    Referring Provider (PT)  Jasper Loser    Onset Date/Surgical Date  --   2 years.     Precautions   Precautions  --   PAIN-FREE RT LE EXERCISE.     Restrictions   Weight Bearing Restrictions  No      Balance Screen   Has the patient fallen in the past 6 months  No    Has the patient had a decrease in activity level because of a fear of falling?   No    Is the patient reluctant to leave their home because of a fear of falling?   No      Prior Function   Level of Independence  Independent      ROM / Strength   AROM / PROM / Strength  AROM;Strength      AROM   Overall AROM Comments  Full active right knee range of motion.  Right SLR 10 degrees les then left.      Strength   Overall Strength Comments  Right hip abduction= 4+/5, right knee= 5/5.      Palpation   Palpation comment  Tender to palpation right distal ITB at joint line of patient's right knee.      Special Tests   Other special tests  (-) Clarke's test and pat-fem comp test.  Normal right knee stability.  Some pain reproduction with a Nobel compression test on the right.        Ambulation/Gait   Gait Comments  Essentially normal this morning.                Objective measurements completed on examination: See above findings.       [facility] Adult PT Treatment/Exercise - 03/13/18 0001      Modalities   Modalities  Electrical Stimulation;Moist Heat      Moist Heat Therapy   Number Minutes Moist Heat  15 Minutes    Moist Heat Location  --   Right knee.     Electrical Stimulation   Electrical Stimulation Location  Right knee lateral joint line.    Electrical Stimulation Action  Pre-mod.    Electrical Stimulation Parameters  80-150 Hz x 15 minutes.               PT Short Term Goals - 03/13/18 1112      PT SHORT TERM GOAL #1   Title  STG's=LTG's.        PT Long Term Goals - 03/13/18 1112      PT LONG TERM GOAL #1   Title  Independent with an HEP.    Time  4    Period  Weeks    Status  New      PT LONG TERM GOAL #2   Title  Perform ADL's with right knee pain-level not > 2/10.    Time  4    Period  Weeks    Status  New             Plan - 03/13/18 1102    Clinical Impression Statement  The patient fell on her knees approximately 2 years ago and has had ongoing pain.  A recent injection was very helpful.  Her pain rise to high levels with squatting.  She has full right knee AROM.  She is tender to palpation at her right knee lateral joint line in region of ITB.  Special testing was essentially negative.    Patient will benefit from skilled physical therapy intervention to address deficits anfd pain.      History and Personal Factors relevant to plan of care:  3 cervical and 3 lumbar surgeries.    Clinical Presentation  Stable    Clinical Presentation due to:  Injection helped.    Clinical Decision Making  Low    Rehab Potential  Excellent    PT Frequency  2x /  week    PT Duration  4 weeks    PT Treatment/Interventions  ADLs/Self Care Home Management;Cryotherapy;Electrical Stimulation;Moist Heat;Ultrasound;Therapeutic activities;Therapeutic exercise;Patient/family education;Manual techniques;Vasopneumatic Device    PT Next Visit Plan  Please do combo e'stim/U/S to right lateral knee  f/b IASTM.  SDLY hip abbuction and "clamshell"; PAIN-FREE right knee exercises, supine HSS.  Establish HEP.  Modalities PRN.    Consulted and Agree with Plan of Care  Patient       Patient will benefit from skilled therapeutic intervention in order to improve the following deficits and impairments:  Pain, Decreased activity tolerance, Decreased strength  Visit Diagnosis: Chronic pain of right knee - Plan: PT plan of care cert/re-cert     Problem List Patient Active Problem List   Diagnosis Date Noted  . Osteoarthritis of cervical spine 11/03/2015  . Insomnia related to another mental disorder 09/11/2015  . MDD (major depressive disorder), recurrent episode, mild ([place]) 09/11/2015  . Panic disorder without agoraphobia 09/11/2015  . PTSD (post-traumatic stress disorder) 09/11/2015  . Leukocytosis 01/26/2015  . Migraine 01/26/2015  . Hiatal hernia 09/04/2014  . Leg numbness 06/04/2014  . History of UTI 05/07/2014  . History of DVT (deep vein thrombosis) 04/17/2014  . Rectocele 04/17/2014  . Mixed incontinence 02/08/2014  . Spinal stenosis at L4-L5 level 02/06/2014  . Spinal stenosis, lumbar region, without neurogenic claudication 05/31/2012  . Palpitations 02/21/2012  . Anemia 12/29/2011  . Chronic back pain 12/29/2011  . Herpes simplex 12/29/2011  . S/P hysterectomy 12/29/2011  . Pelvic hematoma, female 11/20/2008  . Pancreatitis 11/20/2008  . [place] DISEASE, CERVICAL 11/14/2008  . HEADACHE, CHRONIC 11/14/2008  . DEPRESSION, HX OF 11/14/2008    Auguste Tebbetts, [place] MPT 03/13/2018, 11:14 AM  [facility] [place], [place], [address] Phone: [phone]   Fax:  [phone]  Name: JOURNI MOFFA MRN: [id number] Date of Birth: Jun 30, 1950

## 2018-03-20 ENCOUNTER — Encounter: Payer: Self-pay | Admitting: Physical Therapy

## 2018-03-20 ENCOUNTER — Ambulatory Visit: Payer: Medicare Other | Admitting: Physical Therapy

## 2018-03-20 DIAGNOSIS — M25561 Pain in right knee: Secondary | ICD-10-CM | POA: Diagnosis not present

## 2018-03-20 DIAGNOSIS — G8929 Other chronic pain: Secondary | ICD-10-CM

## 2018-03-20 NOTE — Therapy (Signed)
[facility] [place], [place], [address] Phone: [phone]   Fax:  [phone]  Physical Therapy Treatment  Patient Details  Name: [name] MRN: [id number] Date of Birth: September 23, 1950 Referring Provider (PT): Jasper Loser   Encounter Date: 03/20/2018  PT End of Session - 03/20/18 0949    Visit Number  2    Number of Visits  8    Date for PT Re-Evaluation  04/24/18    PT Start Time  0950    PT Stop Time  1033    PT Time Calculation (min)  43 min    Activity Tolerance  Patient tolerated treatment well    Behavior During Therapy  [facility] for tasks assessed/performed       Past Medical History:  Diagnosis Date  . Anemia   . Anxiety 2000  . Depression 2000  . GERD (gastroesophageal reflux disease)   . Headache(784.0)   . PONV (postoperative nausea and vomiting)   . Seasonal allergies     Past Surgical History:  Procedure Laterality Date  . ABDOMINAL HYSTERECTOMY  1984  . BACK SURGERY  2006, 2008   lower back, fusion in l5-s1  . CHOLECYSTECTOMY  2003  . ERCP W/ SPHINCTEROTOMY AND BALLOON DILATION  2005   pancreatitis   . LUMBAR LAMINECTOMY/DECOMPRESSION MICRODISCECTOMY N/A 05/31/2012   Procedure: MICRO LUMBAR DECOMPRESSION L4 - L5, L5 - S1 ;  Surgeon: Johnn Hai, MD;  [facility]: [facility];  Service: Orthopedics;  Laterality: N/A;  . LUMBAR LAMINECTOMY/DECOMPRESSION MICRODISCECTOMY Right 02/06/2014   Procedure: LUMBAR DECOMPRESSION L3-L4, REDO LUMBAR DECOMPRESSION L4-L5 ON THE RIGHT;  Surgeon: Johnn Hai, MD;  Location: [facility];  Service: Orthopedics;  Laterality: Right;  . NECK SURGERY  2001,2007   herniated disc- cervical fusion(limited side to side mobility)  . SHOULDER ARTHROSCOPY Bilateral 2010, 2011   debridement    There were no vitals filed for this visit.  Subjective Assessment - 03/20/18 0948    Subjective  Reports that she brought in her TENS unit which she uses. Reports discomfort with squatting  and bending. No aching with ambulation.    Pertinent History  3 cervical and 3 lumbar surgeries.    Patient Stated Goals  Get back to normal and be able to squat.    Currently in Pain?  No/denies         [facility] PT Assessment - 03/20/18 0001      Assessment   Medical Diagnosis  Right knee pain.    Referring Provider (PT)  Jasper Loser      Restrictions   Weight Bearing Restrictions  No                   [facility] Adult PT Treatment/Exercise - 03/20/18 0001      Modalities   Modalities  Electrical Stimulation;Moist Heat;Ultrasound      Moist Heat Therapy   Number Minutes Moist Heat  15 Minutes    Moist Heat Location  Knee      Electrical Stimulation   Electrical Stimulation Location  R distal ITB    Electrical Stimulation Action  Pre-Mod    Electrical Stimulation Parameters  80-150 hz x15 min    Electrical Stimulation Goals  Pain;Tone      Ultrasound   Ultrasound Location  R distal ITB    Ultrasound Parameters  1.5 w/cm2. 100%, 1 mhz x10 min    Ultrasound Goals  Pain      Manual Therapy  Manual Therapy  Myofascial release    Myofascial Release  IASTW to R distal ITB, lateral HS to reduce tightness and pain             PT Education - 03/20/18 1024    Education Details  HEP- HS and ITB stretch    Person(s) Educated  Patient    Methods  Explanation;Verbal cues;Handout    Comprehension  Verbalized understanding       PT Short Term Goals - 03/13/18 1112      PT SHORT TERM GOAL #1   Title  STG's=LTG's.        PT Long Term Goals - 03/13/18 1112      PT LONG TERM GOAL #1   Title  Independent with an HEP.    Time  4    Period  Weeks    Status  New      PT LONG TERM GOAL #2   Title  Perform ADL's with right knee pain-level not > 2/10.    Time  4    Period  Weeks    Status  New            Plan - 03/20/18 1040    Clinical Impression Statement  Patient arrived in clinic with no complaints of any current pain in lateral R knee. Patient  educated regarding the POC and how it will benefit her. Minimal redness response to IASTW to R distal ITB with no complaints of pain during the treatment. Normal modalities response noted following removal of the modalities. New HEP for ITB, HS stretches provided to patient with education regarding parameters and technique for all. Patient verbalized understanding of all education.    Rehab Potential  Excellent    PT Frequency  2x / week    PT Duration  4 weeks    PT Treatment/Interventions  ADLs/Self Care Home Management;Cryotherapy;Electrical Stimulation;Moist Heat;Ultrasound;Therapeutic activities;Therapeutic exercise;Patient/family education;Manual techniques;Vasopneumatic Device    PT Next Visit Plan  Please do combo e'stim/U/S to right lateral knee f/b IASTM.  SDLY hip abbuction and "clamshell"; PAIN-FREE right knee exercises, supine HSS.  Establish HEP.  Modalities PRN.    Consulted and Agree with Plan of Care  Patient       Patient will benefit from skilled therapeutic intervention in order to improve the following deficits and impairments:  Pain, Decreased activity tolerance, Decreased strength  Visit Diagnosis: Chronic pain of right knee     Problem List Patient Active Problem List   Diagnosis Date Noted  . Osteoarthritis of cervical spine 11/03/2015  . Insomnia related to another mental disorder 09/11/2015  . MDD (major depressive disorder), recurrent episode, mild ([place]) 09/11/2015  . Panic disorder without agoraphobia 09/11/2015  . PTSD (post-traumatic stress disorder) 09/11/2015  . Leukocytosis 01/26/2015  . Migraine 01/26/2015  . Hiatal hernia 09/04/2014  . Leg numbness 06/04/2014  . History of UTI 05/07/2014  . History of DVT (deep vein thrombosis) 04/17/2014  . Rectocele 04/17/2014  . Mixed incontinence 02/08/2014  . Spinal stenosis at L4-L5 level 02/06/2014  . Spinal stenosis, lumbar region, without neurogenic claudication 05/31/2012  . Palpitations 02/21/2012  .  Anemia 12/29/2011  . Chronic back pain 12/29/2011  . Herpes simplex 12/29/2011  . S/P hysterectomy 12/29/2011  . Pelvic hematoma, female 11/20/2008  . Pancreatitis 11/20/2008  . [place] DISEASE, CERVICAL 11/14/2008  . HEADACHE, CHRONIC 11/14/2008  . DEPRESSION, HX OF 11/14/2008    Standley Brooking, PTA 03/20/2018, 11:08 AM  [facility] [address]  [place], [place], [address] Phone: [phone]   Fax:  [phone]  Name: [name] MRN: [id number] Date of Birth: 1950-05-29

## 2018-03-27 ENCOUNTER — Encounter: Payer: Self-pay | Admitting: Physical Therapy

## 2018-03-27 ENCOUNTER — Ambulatory Visit: Payer: Medicare Other | Admitting: Physical Therapy

## 2018-03-27 DIAGNOSIS — G8929 Other chronic pain: Secondary | ICD-10-CM

## 2018-03-27 DIAGNOSIS — M25561 Pain in right knee: Principal | ICD-10-CM

## 2018-03-27 NOTE — Therapy (Signed)
[facility] [place], [place], [address] Phone: [phone]   Fax:  [phone]  Physical Therapy Treatment  Patient Details  Name: [name] MRN: [id number] Date of Birth: 1950/04/08 Referring Provider (PT): Jasper Loser   Encounter Date: 03/27/2018  PT End of Session - 03/27/18 0816    Visit Number  3    Number of Visits  8    Date for PT Re-Evaluation  04/24/18    PT Start Time  0816    PT Stop Time  0902    PT Time Calculation (min)  46 min    Activity Tolerance  Patient tolerated treatment well    Behavior During Therapy  [facility] for tasks assessed/performed       Past Medical History:  Diagnosis Date  . Anemia   . Anxiety 2000  . Depression 2000  . GERD (gastroesophageal reflux disease)   . Headache(784.0)   . PONV (postoperative nausea and vomiting)   . Seasonal allergies     Past Surgical History:  Procedure Laterality Date  . ABDOMINAL HYSTERECTOMY  1984  . BACK SURGERY  2006, 2008   lower back, fusion in l5-s1  . CHOLECYSTECTOMY  2003  . ERCP W/ SPHINCTEROTOMY AND BALLOON DILATION  2005   pancreatitis   . LUMBAR LAMINECTOMY/DECOMPRESSION MICRODISCECTOMY N/A 05/31/2012   Procedure: MICRO LUMBAR DECOMPRESSION L4 - L5, L5 - S1 ;  Surgeon: Johnn Hai, MD;  Location: [facility];  Service: Orthopedics;  Laterality: N/A;  . LUMBAR LAMINECTOMY/DECOMPRESSION MICRODISCECTOMY Right 02/06/2014   Procedure: LUMBAR DECOMPRESSION L3-L4, REDO LUMBAR DECOMPRESSION L4-L5 ON THE RIGHT;  Surgeon: Johnn Hai, MD;  [facility]: [facility];  Service: Orthopedics;  Laterality: Right;  . NECK SURGERY  2001,2007   herniated disc- cervical fusion(limited side to side mobility)  . SHOULDER ARTHROSCOPY Bilateral 2010, 2011   debridement    There were no vitals filed for this visit.  Subjective Assessment - 03/27/18 0815    Subjective  Reports R knee improvement but has been cleaning out back bedroom and has flared her low  back up.    Pertinent History  3 cervical and 3 lumbar surgeries.    Patient Stated Goals  Get back to normal and be able to squat.    Currently in Pain?  No/denies         [facility] PT Assessment - 03/27/18 0001      Assessment   Medical Diagnosis  Right knee pain.    Referring Provider (PT)  Jasper Loser      Restrictions   Weight Bearing Restrictions  No                   [facility] Adult PT Treatment/Exercise - 03/27/18 0001      Modalities   Modalities  Electrical Stimulation;Moist Heat;Ultrasound      Moist Heat Therapy   Number Minutes Moist Heat  25 Minutes    Moist Heat Location  Knee      Electrical Stimulation   Electrical Stimulation Location  R distal ITB    Electrical Stimulation Action  Pre-Mod    Electrical Stimulation Parameters  80-150 hz x15 min    Electrical Stimulation Goals  Pain;Tone      Ultrasound   Ultrasound Location  R distal ITB    Ultrasound Parameters  1.5 w/cm2, 100%, 1 mhz x10 min    Ultrasound Goals  Pain      Manual Therapy   Manual Therapy  Myofascial release    Myofascial Release  IASTW to R distal ITB, lateral HS to reduce tightness and pain               PT Short Term Goals - 03/13/18 1112      PT SHORT TERM GOAL #1   Title  STG's=LTG's.        PT Long Term Goals - 03/27/18 2993      PT LONG TERM GOAL #1   Title  Independent with an HEP.    Time  4    Period  Weeks    Status  Achieved      PT LONG TERM GOAL #2   Title  Perform ADL's with right knee pain-level not > 2/10.    Time  4    Period  Weeks    Status  On-going            Plan - 03/27/18 7169    Clinical Impression Statement  Patient presented in clinic with greater LBP than R knee pain today. Able to achieve LTG of HEP compliance. Still presenting with min-mod tightness of R ITB and HS. Good redness response noted during IASTW to distal R ITB and HS. Normal modalities response noted following removal of the modalities.    Rehab  Potential  Excellent    PT Frequency  2x / week    PT Duration  4 weeks    PT Treatment/Interventions  ADLs/Self Care Home Management;Cryotherapy;Electrical Stimulation;Moist Heat;Ultrasound;Therapeutic activities;Therapeutic exercise;Patient/family education;Manual techniques;Vasopneumatic Device    PT Next Visit Plan  Please do combo e'stim/U/S to right lateral knee f/b IASTM.  SDLY hip abbuction and "clamshell"; PAIN-FREE right knee exercises, supine HSS.  Establish HEP.  Modalities PRN.    Consulted and Agree with Plan of Care  Patient       Patient will benefit from skilled therapeutic intervention in order to improve the following deficits and impairments:  Pain, Decreased activity tolerance, Decreased strength  Visit Diagnosis: Chronic pain of right knee     Problem List Patient Active Problem List   Diagnosis Date Noted  . Osteoarthritis of cervical spine 11/03/2015  . Insomnia related to another mental disorder 09/11/2015  . MDD (major depressive disorder), recurrent episode, mild ([place]) 09/11/2015  . Panic disorder without agoraphobia 09/11/2015  . PTSD (post-traumatic stress disorder) 09/11/2015  . Leukocytosis 01/26/2015  . Migraine 01/26/2015  . Hiatal hernia 09/04/2014  . Leg numbness 06/04/2014  . History of UTI 05/07/2014  . History of DVT (deep vein thrombosis) 04/17/2014  . Rectocele 04/17/2014  . Mixed incontinence 02/08/2014  . Spinal stenosis at L4-L5 level 02/06/2014  . Spinal stenosis, lumbar region, without neurogenic claudication 05/31/2012  . Palpitations 02/21/2012  . Anemia 12/29/2011  . Chronic back pain 12/29/2011  . Herpes simplex 12/29/2011  . S/P hysterectomy 12/29/2011  . Pelvic hematoma, female 11/20/2008  . Pancreatitis 11/20/2008  . [place] DISEASE, CERVICAL 11/14/2008  . HEADACHE, CHRONIC 11/14/2008  . DEPRESSION, HX OF 11/14/2008    Standley Brooking, PTA 03/27/2018, 9:14 AM  [facility] [address] [place], [place], [address] Phone: [phone]   Fax:  [phone]  Name: [name] MRN: [id number] Date of Birth: 09-10-1950

## 2018-03-28 ENCOUNTER — Ambulatory Visit: Payer: Medicare Other | Admitting: Physical Therapy

## 2018-04-03 ENCOUNTER — Ambulatory Visit: Payer: Medicare Other | Admitting: Physical Therapy

## 2018-04-04 ENCOUNTER — Ambulatory Visit: Payer: Medicare Other | Attending: Orthopaedic Surgery | Admitting: Physical Therapy

## 2018-04-04 DIAGNOSIS — G8929 Other chronic pain: Secondary | ICD-10-CM | POA: Insufficient documentation

## 2018-04-04 DIAGNOSIS — M25561 Pain in right knee: Secondary | ICD-10-CM | POA: Diagnosis present

## 2018-04-04 NOTE — Therapy (Addendum)
[facility] [place], [place], [address] Phone: [phone]   Fax:  [phone]  Physical Therapy Treatment  Patient Details  Name: [name] MRN: [id number] Date of Birth: November 09, 1950 Referring Provider (PT): Jasper Loser   Encounter Date: 04/04/2018  PT End of Session - 04/04/18 0817    Visit Number  4    Number of Visits  8    Date for PT Re-Evaluation  04/24/18    PT Start Time  0815    PT Stop Time  0900    PT Time Calculation (min)  45 min    Activity Tolerance  Patient tolerated treatment well    Behavior During Therapy  [facility] for tasks assessed/performed       Past Medical History:  Diagnosis Date  . Anemia   . Anxiety 2000  . Depression 2000  . GERD (gastroesophageal reflux disease)   . Headache(784.0)   . PONV (postoperative nausea and vomiting)   . Seasonal allergies     Past Surgical History:  Procedure Laterality Date  . ABDOMINAL HYSTERECTOMY  1984  . BACK SURGERY  2006, 2008   lower back, fusion in l5-s1  . CHOLECYSTECTOMY  2003  . ERCP W/ SPHINCTEROTOMY AND BALLOON DILATION  2005   pancreatitis   . LUMBAR LAMINECTOMY/DECOMPRESSION MICRODISCECTOMY N/A 05/31/2012   Procedure: MICRO LUMBAR DECOMPRESSION L4 - L5, L5 - S1 ;  Surgeon: Johnn Hai, MD;  Location: [facility];  Service: Orthopedics;  Laterality: N/A;  . LUMBAR LAMINECTOMY/DECOMPRESSION MICRODISCECTOMY Right 02/06/2014   Procedure: LUMBAR DECOMPRESSION L3-L4, REDO LUMBAR DECOMPRESSION L4-L5 ON THE RIGHT;  Surgeon: Johnn Hai, MD;  Location: [facility];  Service: Orthopedics;  Laterality: Right;  . NECK SURGERY  2001,2007   herniated disc- cervical fusion(limited side to side mobility)  . SHOULDER ARTHROSCOPY Bilateral 2010, 2011   debridement    There were no vitals filed for this visit.  Subjective Assessment - 04/04/18 0816    Subjective  Reports her knee pain is better. Reports squatting this morning and had no pain. States that  occasionally she has medial knee pain during ambulation and sometimes during squatting.    Pertinent History  3 cervical and 3 lumbar surgeries.    Patient Stated Goals  Get back to normal and be able to squat.    Currently in Pain?  No/denies         [facility] PT Assessment - 04/04/18 0001      Assessment   Medical Diagnosis  Right knee pain.    Referring Provider (PT)  Jasper Loser      Restrictions   Weight Bearing Restrictions  No                   [facility] Adult PT Treatment/Exercise - 04/04/18 0001      Exercises   Exercises  Knee/Hip      Knee/Hip Exercises: Aerobic   Nustep  L5 x11 min      Knee/Hip Exercises: Supine   Short Arc Quad Sets  AROM;Right;2 sets;10 reps    Hip Adduction Isometric  Strengthening;Both;20 reps    Straight Leg Raises  AROM;Right;20 reps    Other Supine Knee/Hip Exercises  B hip clam green theraband x15 reps      Modalities   Modalities  Electrical Stimulation;Moist Heat      Moist Heat Therapy   Number Minutes Moist Heat  15 Minutes    Moist Heat Location  Knee  Geophysical data processor Action  Pre-Mod    Electrical Stimulation Parameters  80-150 hz x15 min    Electrical Stimulation Goals  Other (comment)   post therex cool down              PT Short Term Goals - 03/13/18 1112      PT SHORT TERM GOAL #1   Title  STG's=LTG's.        PT Long Term Goals - 03/27/18 5329      PT LONG TERM GOAL #1   Title  Independent with an HEP.    Time  4    Period  Weeks    Status  Achieved      PT LONG TERM GOAL #2   Title  Perform ADL's with right knee pain-level not > 2/10.    Time  4    Period  Weeks    Status  On-going            Plan - 04/04/18 0849    Clinical Impression Statement  Patient presented in clinic with no complaints of any current R knee pain. Patient did report occasional R medial knee pain with ambulation or when  squatting. Patient able to complete low level therex without any complaints of knee pain. Patient did report quad soreness following Nustep. Normal modalities response noted following removal of the modalities.    Rehab Potential  Excellent    PT Frequency  2x / week    PT Duration  4 weeks    PT Treatment/Interventions  ADLs/Self Care Home Management;Cryotherapy;Electrical Stimulation;Moist Heat;Ultrasound;Therapeutic activities;Therapeutic exercise;Patient/family education;Manual techniques;Vasopneumatic Device    PT Next Visit Plan  Continue with knee strengthening and modalities as symptoms dictate.    Consulted and Agree with Plan of Care  Patient       Patient will benefit from skilled therapeutic intervention in order to improve the following deficits and impairments:  Pain, Decreased activity tolerance, Decreased strength  Visit Diagnosis: Chronic pain of right knee     Problem List Patient Active Problem List   Diagnosis Date Noted  . Osteoarthritis of cervical spine 11/03/2015  . Insomnia related to another mental disorder 09/11/2015  . MDD (major depressive disorder), recurrent episode, mild ([place]) 09/11/2015  . Panic disorder without agoraphobia 09/11/2015  . PTSD (post-traumatic stress disorder) 09/11/2015  . Leukocytosis 01/26/2015  . Migraine 01/26/2015  . Hiatal hernia 09/04/2014  . Leg numbness 06/04/2014  . History of UTI 05/07/2014  . History of DVT (deep vein thrombosis) 04/17/2014  . Rectocele 04/17/2014  . Mixed incontinence 02/08/2014  . Spinal stenosis at L4-L5 level 02/06/2014  . Spinal stenosis, lumbar region, without neurogenic claudication 05/31/2012  . Palpitations 02/21/2012  . Anemia 12/29/2011  . Chronic back pain 12/29/2011  . Herpes simplex 12/29/2011  . S/P hysterectomy 12/29/2011  . Pelvic hematoma, female 11/20/2008  . Pancreatitis 11/20/2008  . [place] DISEASE, CERVICAL 11/14/2008  . HEADACHE, CHRONIC 11/14/2008  . DEPRESSION, HX OF  11/14/2008    Standley Brooking, PTA 04/04/2018, 9:04 AM  [facility] [address] [place], [place], [address] Phone: [phone]   Fax:  [phone]  Name: [name] MRN: [id number] Date of Birth: 09/29/1950  PHYSICAL THERAPY DISCHARGE SUMMARY  Visits from Start of Care: 4.  Current functional level related to goals / functional outcomes: See above.    Remaining deficits: See goal section.   Education /  Equipment: HEP. Plan: Patient agrees to discharge.  Patient goals were partially met. Patient is being discharged due to not returning since the last visit.  ?????         Mali Applegate MPT

## 2018-04-11 ENCOUNTER — Ambulatory Visit: Payer: Medicare Other | Admitting: Physical Therapy

## 2018-11-03 ENCOUNTER — Other Ambulatory Visit: Payer: Self-pay | Admitting: *Deleted

## 2018-11-03 DIAGNOSIS — Z20822 Contact with and (suspected) exposure to covid-19: Secondary | ICD-10-CM

## 2018-11-04 LAB — NOVEL CORONAVIRUS, NAA: SARS-CoV-2, NAA: NOT DETECTED

## 2019-08-28 ENCOUNTER — Ambulatory Visit: Payer: Medicare Other | Admitting: Dermatology

## 2019-08-28 ENCOUNTER — Encounter: Payer: Self-pay | Admitting: Dermatology

## 2019-08-28 ENCOUNTER — Other Ambulatory Visit: Payer: Self-pay

## 2019-08-28 DIAGNOSIS — L821 Other seborrheic keratosis: Secondary | ICD-10-CM

## 2019-08-28 DIAGNOSIS — L570 Actinic keratosis: Secondary | ICD-10-CM | POA: Diagnosis not present

## 2019-08-28 DIAGNOSIS — L281 Prurigo nodularis: Secondary | ICD-10-CM

## 2019-08-28 NOTE — Patient Instructions (Addendum)
Sun exposed areas and back checked.  The brown 1 cm textured spots on the right arm are both benign keratoses and can be ignored.  The more warty crusts on the left arm and the left hip could represent minor sun damage, warts, or pickers bumps.  Each was frozen for 6 seconds with liquid nitrogen.  If this results in the spots being smooth, the follow-up visit can be canceled.  She understands that if she picks the spots even unconsciously then they will almost certainly recur.  She knows she can call me with any questions.

## 2019-10-07 ENCOUNTER — Encounter: Payer: Self-pay | Admitting: Dermatology

## 2019-10-07 NOTE — Progress Notes (Signed)
   Follow-Up Visit   Subjective  [name] is a 69 y.o. female who presents for the following: new problem (Pt stated--left arm have couple circle dark spot, itching, and getting larger--1 year. tried no treatment.).  New spots Location: Left arm Duration:  Quality:  Associated Signs/Symptoms: Modifying Factors:  Severity:  Timing: Context:   Objective  Well appearing patient in no apparent distress; mood and affect are within normal limits.  All sun exposed areas plus back examined.   Assessment & Plan    AK (actinic keratosis) Left Forearm - Posterior  Destruction of lesion - Left Forearm - Posterior Complexity: simple   Destruction method: cryotherapy   Informed consent: discussed and consent obtained   Timeout:  patient name, date of birth, surgical site, and procedure verified Lesion destroyed using liquid nitrogen: Yes   Region frozen until ice ball extended beyond lesion: Yes   Cryotherapy cycles:  5 Outcome: patient tolerated procedure well with no complications    Seborrheic keratosis Left Upper Arm - Posterior  Leave if stable  Prurigo nodularis Right Thigh - Anterior  5-second LN2 freeze (no procedure charge).  Try to avoid picking.   Sun exposed areas and back checked.  The brown 1 cm textured spots on the right arm are both benign keratoses and can be ignored.  The more warty crusts on the left arm and the left hip could represent minor sun damage, warts, or pickers bumps.  Each was frozen for 6 seconds with liquid nitrogen.  If this results in the spots being smooth, the follow-up visit can be canceled.  She understands that if she picks the spots even unconsciously then they will almost certainly recur.  She knows she can call me with any questions.  I, Lavonna Monarch, MD, have reviewed all documentation for this visit.  The documentation on 10/07/19 for the exam, diagnosis, procedures, and orders are all accurate and complete.

## 2019-10-29 ENCOUNTER — Other Ambulatory Visit: Payer: Self-pay

## 2019-10-29 ENCOUNTER — Ambulatory Visit: Payer: Medicare Other | Admitting: Dermatology

## 2019-10-29 ENCOUNTER — Encounter: Payer: Self-pay | Admitting: Dermatology

## 2019-10-29 DIAGNOSIS — D485 Neoplasm of uncertain behavior of skin: Secondary | ICD-10-CM | POA: Diagnosis not present

## 2019-10-29 DIAGNOSIS — L820 Inflamed seborrheic keratosis: Secondary | ICD-10-CM | POA: Diagnosis not present

## 2019-10-29 DIAGNOSIS — D219 Benign neoplasm of connective and other soft tissue, unspecified: Secondary | ICD-10-CM

## 2019-10-29 NOTE — Patient Instructions (Addendum)
Two issues discussed with [name] today, date of birth 1950-08-21.  Inflamed keratosis versus fibroma on the left lower hip had a partial response and refreezing done.  The warty keratosis on the left lower triceps really did not respond to freezing so shave biopsy with light cautery done.  She understands that this may leave a little discoloration with a small chance of recurrence.  No special care for either spot and follow-up can be on a as needed basis.  Biopsy, Surgery (Curettage) & Surgery (Excision) Aftercare Instructions  1. Okay to remove bandage in 24 hours  2. Wash area with soap and water  3. Apply Vaseline to area twice daily until healed (Not Neosporin)  4. Okay to cover with a Band-Aid to decrease the chance of infection or prevent irritation from clothing; also it's okay to uncover lesion at home.  5. Suture instructions: return to our office in 7-10 or 10-14 days for a nurse visit for suture removal. Variable healing with sutures, if pain or itching occurs call our office. It's okay to shower or bathe 24 hours after sutures are given.  6. The following risks may occur after a biopsy, curettage or excision: bleeding, scarring, discoloration, recurrence, infection (redness, yellow drainage, pain or swelling).  7. For questions, concerns and results call our office at [place] before 4pm & Friday before 3pm. Biopsy results will be available in 1 week.

## 2019-11-28 NOTE — Progress Notes (Signed)
   Follow-Up Visit   Subjective  [name] is a 69 y.o. female who presents for the following: Follow-up (2 month follow up--left forearm and left hip--keratosis).  Follow up Location:  Duration:  Quality:  Associated Signs/Symptoms: Modifying Factors:  Severity:  Timing: Context:   Objective  Well appearing patient in no apparent distress; mood and affect are within normal limits.  A full examination was performed including scalp, head, eyes, ears, nose, lips, neck, chest, axillae, abdomen, back, buttocks, bilateral upper extremities, bilateral lower extremities, hands, feet, fingers, toes, fingernails, and toenails. All findings within normal limits unless otherwise noted below.    Two issues discussed with Jasani Lengel today, date of birth Jul 01, 1950.  Inflamed keratosis versus fibroma on the left lower hip had a partial response and refreezing done.  The warty keratosis on the left lower triceps really did not respond to freezing so shave biopsy with light cautery done.  She understands that this may leave a little discoloration with a small chance of recurrence.  No special care for either spot and follow-up can be on a as needed basis.   Assessment & Plan    Neoplasm of uncertain behavior of skin Left Upper Arm - Posterior  Skin / nail biopsy Type of biopsy: tangential   Informed consent: discussed and consent obtained   Timeout: patient name, date of birth, surgical site, and procedure verified   Anesthesia: the lesion was anesthetized in a standard fashion   Anesthetic:  1% lidocaine w/ epinephrine 1-100,000 local infiltration Instrument used: flexible razor blade   Hemostasis achieved with: ferric subsulfate   Outcome: patient tolerated procedure well   Post-procedure details: sterile dressing applied and wound care instructions given   Dressing type: bandage and petrolatum    Specimen 1 - Surgical pathology Differential Diagnosis: SCC BCC Check  Margins: No  Inflamed seborrheic keratosis Left Hip  Destruction of lesion - Left Hip Complexity: simple   Destruction method: cryotherapy   Informed consent: discussed and consent obtained   Lesion destroyed using liquid nitrogen: Yes   Cryotherapy cycles:  5 Outcome: patient tolerated procedure well with no complications       I, Lavonna Monarch, MD, have reviewed all documentation for this visit.  The documentation on 12/01/19 for the exam, diagnosis, procedures, and orders are all accurate and complete.

## 2019-12-01 ENCOUNTER — Encounter: Payer: Self-pay | Admitting: Dermatology

## 2020-06-04 ENCOUNTER — Other Ambulatory Visit: Payer: Self-pay | Admitting: Neurological Surgery

## 2020-06-04 DIAGNOSIS — M4310 Spondylolisthesis, site unspecified: Secondary | ICD-10-CM

## 2020-06-11 NOTE — Progress Notes (Signed)
Surgical Instructions    Your procedure is scheduled on Monday 06/16/2020.  Report to [facility] Main Entrance "A" at 07:30 A.M., then check in with the Admitting office.  Call this number if you have problems the morning of surgery:  [phone]   If you have any questions prior to your surgery date call [phone]: Open Monday-Friday 8am-4pm    Remember:  Do not eat or drink after midnight the night before your surgery    Take these medicines the morning of surgery with A SIP OF WATER: Duloxetine (Cymbalta) Gabapentin (Neurontin) Methocarbamol (Robaxin) - if needed Oxycodone-acetaminophen (Percocet) Pantoprazole (Protonix) Polyethyl Glycol -Propyl Glycol (Systane Ultra OP) eye drops - if needed  As of today, STOP taking any Aspirin (unless otherwise instructed by your surgeon) Aleve, Naproxen, Ibuprofen, Motrin, Advil, Goody's, BC's, all herbal medications, fish oil, and all vitamins.                     Do not wear jewelry, make up, or nail polish            Do not wear lotions, powders, perfumes/colognes, or deodorant.            Do not shave 48 hours prior to surgery.  Men may shave face and neck.            Do not bring valuables to the hospital.            [facility] is not responsible for any belongings or valuables.  Do NOT Smoke (Tobacco/Vaping) or drink Alcohol 24 hours prior to your procedure  If you use a CPAP at night, you may bring all equipment for your overnight stay.   Contacts, glasses, hearing aids, dentures or partials may not be worn into surgery, please bring cases for these belongings   For patients admitted to the hospital, discharge time will be determined by your treatment team.   Patients discharged the day of surgery will not be allowed to drive home, and someone needs to stay with them for 24 hours.    Special instructions:   [place]- Preparing For Surgery  Before surgery, you can play an important role. Because skin is not sterile,  your skin needs to be as free of germs as possible. You can reduce the number of germs on your skin by washing with CHG (chlorahexidine gluconate) Soap before surgery.  CHG is an antiseptic cleaner which kills germs and bonds with the skin to continue killing germs even after washing.    Oral Hygiene is also important to reduce your risk of infection.  Remember - BRUSH YOUR TEETH THE MORNING OF SURGERY WITH YOUR REGULAR TOOTHPASTE  Please do not use if you have an allergy to CHG or antibacterial soaps. If your skin becomes reddened/irritated stop using the CHG.  Do not shave (including legs and underarms) for at least 48 hours prior to first CHG shower. It is OK to shave your face.  Please follow these instructions carefully.   1. Shower the NIGHT BEFORE SURGERY and the MORNING OF SURGERY  2. If you chose to wash your hair, wash your hair first as usual with your normal shampoo.  3. After you shampoo, rinse your hair and body thoroughly to remove the shampoo.  4. Wash Face and genitals (private parts) with your normal soap.   5.  Shower the NIGHT BEFORE SURGERY and the MORNING OF SURGERY with CHG Soap.   6. Use CHG Soap as you would  any other liquid soap. You can apply CHG directly to the skin and wash gently with a scrungie or a clean washcloth.   7. Apply the CHG Soap to your body ONLY FROM THE NECK DOWN.  Do not use on open wounds or open sores. Avoid contact with your eyes, ears, mouth and genitals (private parts). Wash Face and genitals (private parts)  with your normal soap.   8. Wash thoroughly, paying special attention to the area where your surgery will be performed.  9. Thoroughly rinse your body with warm water from the neck down.  10. DO NOT shower/wash with your normal soap after using and rinsing off the CHG Soap.  11. Pat yourself dry with a CLEAN TOWEL.  12. Wear CLEAN PAJAMAS to bed the night before surgery  13. Place CLEAN SHEETS on your bed the night before your  surgery  14. DO NOT SLEEP WITH PETS.   Day of Surgery: Take a shower with CHG soap. Wear Clean/Comfortable clothing the morning of surgery Do not apply any deodorants/lotions.   Remember to brush your teeth WITH YOUR REGULAR TOOTHPASTE.   Please read over the following fact sheets that you were given.

## 2020-06-12 ENCOUNTER — Inpatient Hospital Stay (HOSPITAL_COMMUNITY)
Admission: RE | Admit: 2020-06-12 | Discharge: 2020-06-12 | Disposition: A | Discharge status: Home / Self Care | Payer: Medicare Other | Source: Ambulatory Visit

## 2020-06-13 ENCOUNTER — Encounter (HOSPITAL_COMMUNITY)
Admission: RE | Admit: 2020-06-13 | Discharge: 2020-06-13 | Disposition: A | Discharge status: Home / Self Care | Payer: Medicare Other | Source: Ambulatory Visit | Attending: Neurological Surgery | Admitting: Neurological Surgery

## 2020-06-13 ENCOUNTER — Other Ambulatory Visit: Payer: Self-pay

## 2020-06-13 ENCOUNTER — Encounter (HOSPITAL_COMMUNITY): Payer: Self-pay

## 2020-06-13 DIAGNOSIS — M4310 Spondylolisthesis, site unspecified: Secondary | ICD-10-CM | POA: Insufficient documentation

## 2020-06-13 DIAGNOSIS — Z20822 Contact with and (suspected) exposure to covid-19: Secondary | ICD-10-CM | POA: Insufficient documentation

## 2020-06-13 DIAGNOSIS — Z01818 Encounter for other preprocedural examination: Secondary | ICD-10-CM | POA: Insufficient documentation

## 2020-06-13 HISTORY — DX: Fibromyalgia: M79.7

## 2020-06-13 HISTORY — DX: Unspecified osteoarthritis, unspecified site: M19.90

## 2020-06-13 LAB — CBC WITH DIFFERENTIAL/PLATELET
Abs Immature Granulocytes: 0.02 10*3/uL (ref 0.00–0.07)
Basophils Absolute: 0 10*3/uL (ref 0.0–0.1)
Basophils Relative: 1 %
Eosinophils Absolute: 0 10*3/uL (ref 0.0–0.5)
Eosinophils Relative: 0 %
HCT: 41.7 % (ref 36.0–46.0)
Hemoglobin: 13.1 g/dL (ref 12.0–15.0)
Immature Granulocytes: 0 %
Lymphocytes Relative: 37 %
Lymphs Abs: 2.6 10*3/uL (ref 0.7–4.0)
MCH: 31 pg (ref 26.0–34.0)
MCHC: 31.4 g/dL (ref 30.0–36.0)
MCV: 98.6 fL (ref 80.0–100.0)
Monocytes Absolute: 0.6 10*3/uL (ref 0.1–1.0)
Monocytes Relative: 8 %
Neutro Abs: 3.8 10*3/uL (ref 1.7–7.7)
Neutrophils Relative %: 54 %
Platelets: 317 10*3/uL (ref 150–400)
RBC: 4.23 MIL/uL (ref 3.87–5.11)
RDW: 12.6 % (ref 11.5–15.5)
WBC: 7.1 10*3/uL (ref 4.0–10.5)
nRBC: 0 % (ref 0.0–0.2)

## 2020-06-13 LAB — PROTIME-INR
INR: 0.9 (ref 0.8–1.2)
Prothrombin Time: 12.5 seconds (ref 11.4–15.2)

## 2020-06-13 LAB — TYPE AND SCREEN
ABO/RH(D): A POS
Antibody Screen: NEGATIVE

## 2020-06-13 LAB — BASIC METABOLIC PANEL
Anion gap: 5 (ref 5–15)
BUN: 12 mg/dL (ref 8–23)
CO2: 32 mmol/L (ref 22–32)
Calcium: 9.6 mg/dL (ref 8.9–10.3)
Chloride: 100 mmol/L (ref 98–111)
Creatinine, Ser: 0.79 mg/dL (ref 0.44–1.00)
GFR, Estimated: >60 mL/min (ref >60)
Glucose, Bld: 97 mg/dL (ref 70–99)
Potassium: 4.6 mmol/L (ref 3.5–5.1)
Sodium: 137 mmol/L (ref 135–145)

## 2020-06-13 LAB — SURGICAL PCR SCREEN
MRSA, PCR: NEGATIVE
Staphylococcus aureus: POSITIVE — AB

## 2020-06-13 NOTE — Progress Notes (Signed)
EKG: 06/13/20 CXR: 06/13/20 ECHO: denies Stress Test: denies Cardiac Cath: denies  Fasting Blood Sugar- na Checks Blood Sugar na___ times a day  OSA/CPAP: No  ASA/Blood Thinners: No  Covid test 06/13/20 at PAT  Anesthesia Review: No  Patient denies shortness of breath, fever, cough, and chest pain at PAT appointment.  Patient verbalized understanding of instructions provided today at the PAT appointment.  Patient asked to review instructions at home and day of surgery.

## 2020-06-14 LAB — SARS CORONAVIRUS 2 (TAT 6-24 HRS): SARS Coronavirus 2: NEGATIVE

## 2020-06-16 ENCOUNTER — Inpatient Hospital Stay (HOSPITAL_COMMUNITY): Payer: Medicare Other

## 2020-06-16 ENCOUNTER — Encounter (HOSPITAL_COMMUNITY)
Admission: RE | Disposition: A | Discharge status: Home / Self Care | Payer: Self-pay | Source: Home / Self Care | Attending: Neurological Surgery

## 2020-06-16 ENCOUNTER — Inpatient Hospital Stay (HOSPITAL_COMMUNITY)
Admission: RE | Admit: 2020-06-16 | Discharge: 2020-06-18 | DRG: 455 | Disposition: A | Discharge status: Home / Self Care | Payer: Medicare Other | Attending: Neurological Surgery | Admitting: Neurological Surgery

## 2020-06-16 ENCOUNTER — Inpatient Hospital Stay (HOSPITAL_COMMUNITY): Payer: Medicare Other | Admitting: Vascular Surgery

## 2020-06-16 ENCOUNTER — Other Ambulatory Visit: Payer: Self-pay

## 2020-06-16 ENCOUNTER — Encounter (HOSPITAL_COMMUNITY): Payer: Self-pay | Admitting: Neurological Surgery

## 2020-06-16 DIAGNOSIS — Z419 Encounter for procedure for purposes other than remedying health state, unspecified: Secondary | ICD-10-CM

## 2020-06-16 DIAGNOSIS — M7138 Other bursal cyst, other site: Secondary | ICD-10-CM | POA: Diagnosis not present

## 2020-06-16 DIAGNOSIS — Z87891 Personal history of nicotine dependence: Secondary | ICD-10-CM

## 2020-06-16 DIAGNOSIS — Z981 Arthrodesis status: Secondary | ICD-10-CM

## 2020-06-16 DIAGNOSIS — Z20822 Contact with and (suspected) exposure to covid-19: Secondary | ICD-10-CM | POA: Diagnosis not present

## 2020-06-16 DIAGNOSIS — M48061 Spinal stenosis, lumbar region without neurogenic claudication: Secondary | ICD-10-CM | POA: Diagnosis not present

## 2020-06-16 DIAGNOSIS — M5416 Radiculopathy, lumbar region: Secondary | ICD-10-CM | POA: Diagnosis present

## 2020-06-16 SURGERY — POSTERIOR LUMBAR FUSION 2 WITH HARDWARE REMOVAL
Anesthesia: General | Site: Back

## 2020-06-16 MED ORDER — MAGNESIUM GLUCONATE 500 MG PO TABS
500.0000 mg | ORAL_TABLET | Freq: Every day | ORAL | Status: DC
  Administered 2020-06-17: 500 mg via ORAL
  Filled 2020-06-16 (×2): qty 1

## 2020-06-16 MED ORDER — ONDANSETRON HCL 4 MG/2ML IJ SOLN: 4.0000 mg | Freq: Four times a day (QID) | INTRAMUSCULAR | Status: DC | PRN

## 2020-06-16 MED ORDER — SODIUM CHLORIDE 0.9% FLUSH
3.0000 mL | Freq: Two times a day (BID) | INTRAVENOUS | Status: DC
  Administered 2020-06-16: 3 mL via INTRAVENOUS

## 2020-06-16 MED ORDER — DEXAMETHASONE SODIUM PHOSPHATE 4 MG/ML IJ SOLN
4.0000 mg | Freq: Four times a day (QID) | INTRAMUSCULAR | Status: DC
  Administered 2020-06-16: 4 mg via INTRAVENOUS
  Filled 2020-06-16: qty 1

## 2020-06-16 MED ORDER — OXYCODONE HCL 5 MG/5ML PO SOLN: 5.0000 mg | Freq: Once | ORAL | Status: AC | PRN

## 2020-06-16 MED ORDER — VITAMIN D 25 MCG (1000 UNIT) PO TABS
5000.0000 [IU] | ORAL_TABLET | Freq: Every day | ORAL | Status: DC
  Administered 2020-06-17: 5000 [IU] via ORAL
  Filled 2020-06-16: qty 5

## 2020-06-16 MED ORDER — SENNA 8.6 MG PO TABS
1.0000 | ORAL_TABLET | Freq: Two times a day (BID) | ORAL | Status: DC
  Administered 2020-06-16 – 2020-06-17 (×3): 8.6 mg via ORAL
  Filled 2020-06-16 (×3): qty 1

## 2020-06-16 MED ORDER — GABAPENTIN 300 MG PO CAPS
300.0000 mg | ORAL_CAPSULE | Freq: Every day | ORAL | Status: DC
  Administered 2020-06-17: 300 mg via ORAL
  Filled 2020-06-16: qty 1

## 2020-06-16 MED ORDER — ROCURONIUM BROMIDE 10 MG/ML (PF) SYRINGE
PREFILLED_SYRINGE | INTRAVENOUS | Status: DC | PRN
  Administered 2020-06-16: 40 mg via INTRAVENOUS
  Administered 2020-06-16: 60 mg via INTRAVENOUS
  Administered 2020-06-16: 40 mg via INTRAVENOUS

## 2020-06-16 MED ORDER — ORAL CARE MOUTH RINSE: 15.0000 mL | Freq: Once | OROMUCOSAL | Status: AC

## 2020-06-16 MED ORDER — GABAPENTIN 300 MG PO CAPS
600.0000 mg | ORAL_CAPSULE | Freq: Two times a day (BID) | ORAL | Status: DC
  Administered 2020-06-16 – 2020-06-18 (×4): 600 mg via ORAL
  Filled 2020-06-16 (×4): qty 2

## 2020-06-16 MED ORDER — ACETAMINOPHEN 325 MG PO TABS: 650.0000 mg | ORAL_TABLET | ORAL | Status: DC | PRN

## 2020-06-16 MED ORDER — THROMBIN 20000 UNITS EX SOLR: CUTANEOUS | Status: DC | PRN

## 2020-06-16 MED ORDER — VANCOMYCIN HCL 1000 MG IV SOLR
INTRAVENOUS | Status: DC | PRN
  Administered 2020-06-16: 1000 mg via TOPICAL

## 2020-06-16 MED ORDER — GABAPENTIN 300 MG PO CAPS: 300.0000 mg | ORAL_CAPSULE | ORAL | Status: DC

## 2020-06-16 MED ORDER — LORATADINE 10 MG PO TABS
10.0000 mg | ORAL_TABLET | Freq: Every day | ORAL | Status: DC
  Administered 2020-06-17: 10 mg via ORAL
  Filled 2020-06-16: qty 1

## 2020-06-16 MED ORDER — FERROUS SULFATE 325 (65 FE) MG PO TABS
325.0000 mg | ORAL_TABLET | Freq: Every day | ORAL | Status: DC
  Administered 2020-06-17: 325 mg via ORAL
  Filled 2020-06-16: qty 1

## 2020-06-16 MED ORDER — FENTANYL CITRATE (PF) 100 MCG/2ML IJ SOLN
INTRAMUSCULAR | Status: DC | PRN
  Administered 2020-06-16 (×2): 50 ug via INTRAVENOUS
  Administered 2020-06-16: 100 ug via INTRAVENOUS
  Administered 2020-06-16 (×2): 50 ug via INTRAVENOUS

## 2020-06-16 MED ORDER — ACETAMINOPHEN 500 MG PO TABS
ORAL_TABLET | ORAL | Status: AC
  Filled 2020-06-16: qty 2

## 2020-06-16 MED ORDER — PHENOL 1.4 % MT LIQD
1.0000 | OROMUCOSAL | Status: DC | PRN
Start: 2020-06-16 — End: 2020-06-18

## 2020-06-16 MED ORDER — OXYCODONE HCL 5 MG PO TABS
5.0000 mg | ORAL_TABLET | Freq: Once | ORAL | Status: AC | PRN
  Administered 2020-06-16: 5 mg via ORAL

## 2020-06-16 MED ORDER — OXYCODONE-ACETAMINOPHEN 5-325 MG PO TABS
1.0000 | ORAL_TABLET | ORAL | Status: DC | PRN
Start: 2020-06-16 — End: 2020-06-18
  Administered 2020-06-16 – 2020-06-17 (×6): 2 via ORAL
  Administered 2020-06-18: 1 via ORAL
  Administered 2020-06-18: 2 via ORAL
  Filled 2020-06-16 (×3): qty 2
  Filled 2020-06-16: qty 1
  Filled 2020-06-16 (×4): qty 2

## 2020-06-16 MED ORDER — CELECOXIB 200 MG PO CAPS
200.0000 mg | ORAL_CAPSULE | Freq: Two times a day (BID) | ORAL | Status: DC
  Administered 2020-06-16 – 2020-06-17 (×3): 200 mg via ORAL
  Filled 2020-06-16 (×3): qty 1

## 2020-06-16 MED ORDER — FENTANYL CITRATE (PF) 100 MCG/2ML IJ SOLN
INTRAMUSCULAR | Status: AC
  Filled 2020-06-16: qty 2

## 2020-06-16 MED ORDER — LIDOCAINE 2% (20 MG/ML) 5 ML SYRINGE
INTRAMUSCULAR | Status: DC | PRN
  Administered 2020-06-16: 40 mg via INTRAVENOUS

## 2020-06-16 MED ORDER — BUPIVACAINE HCL (PF) 0.25 % IJ SOLN
INTRAMUSCULAR | Status: AC
  Filled 2020-06-16: qty 30

## 2020-06-16 MED ORDER — THROMBIN 20000 UNITS EX SOLR
CUTANEOUS | Status: AC
  Filled 2020-06-16: qty 20000

## 2020-06-16 MED ORDER — CALCIUM CARBONATE-VITAMIN D 500-200 MG-UNIT PO TABS
1.0000 | ORAL_TABLET | Freq: Every day | ORAL | Status: DC
  Administered 2020-06-17 – 2020-06-18 (×2): 1 via ORAL
  Filled 2020-06-16 (×2): qty 1

## 2020-06-16 MED ORDER — ACETAMINOPHEN 500 MG PO TABS
1000.0000 mg | ORAL_TABLET | Freq: Once | ORAL | Status: AC | PRN
  Administered 2020-06-16: 1000 mg via ORAL

## 2020-06-16 MED ORDER — METHOCARBAMOL 500 MG PO TABS
500.0000 mg | ORAL_TABLET | Freq: Three times a day (TID) | ORAL | Status: DC | PRN
  Administered 2020-06-16 – 2020-06-18 (×5): 500 mg via ORAL
  Filled 2020-06-16 (×4): qty 1

## 2020-06-16 MED ORDER — PROPOFOL 500 MG/50ML IV EMUL
INTRAVENOUS | Status: DC | PRN
  Administered 2020-06-16: 100 ug/kg/min via INTRAVENOUS
  Administered 2020-06-16: 175 ug/kg/min via INTRAVENOUS
  Administered 2020-06-16: 150 ug/kg/min via INTRAVENOUS

## 2020-06-16 MED ORDER — DEXAMETHASONE SODIUM PHOSPHATE 10 MG/ML IJ SOLN
10.0000 mg | Freq: Once | INTRAMUSCULAR | Status: AC
  Administered 2020-06-16: 10 mg via INTRAVENOUS
  Filled 2020-06-16: qty 1

## 2020-06-16 MED ORDER — THROMBIN 5000 UNITS EX SOLR
CUTANEOUS | Status: AC
  Filled 2020-06-16: qty 5000

## 2020-06-16 MED ORDER — ONDANSETRON HCL 4 MG PO TABS: 4.0000 mg | ORAL_TABLET | Freq: Four times a day (QID) | ORAL | Status: DC | PRN

## 2020-06-16 MED ORDER — DIAZEPAM 5 MG/ML IJ SOLN
INTRAMUSCULAR | Status: AC
  Filled 2020-06-16: qty 2

## 2020-06-16 MED ORDER — MIDAZOLAM HCL 2 MG/2ML IJ SOLN
INTRAMUSCULAR | Status: AC
  Filled 2020-06-16: qty 2

## 2020-06-16 MED ORDER — PANTOPRAZOLE SODIUM 40 MG PO TBEC
40.0000 mg | DELAYED_RELEASE_TABLET | Freq: Two times a day (BID) | ORAL | Status: DC
  Administered 2020-06-16 – 2020-06-17 (×3): 40 mg via ORAL
  Filled 2020-06-16 (×3): qty 1

## 2020-06-16 MED ORDER — DEXAMETHASONE SODIUM PHOSPHATE 10 MG/ML IJ SOLN
INTRAMUSCULAR | Status: AC
  Filled 2020-06-16: qty 1

## 2020-06-16 MED ORDER — OXYCODONE HCL 5 MG PO TABS
ORAL_TABLET | ORAL | Status: AC
  Filled 2020-06-16: qty 1

## 2020-06-16 MED ORDER — FENTANYL CITRATE (PF) 250 MCG/5ML IJ SOLN
INTRAMUSCULAR | Status: AC
  Filled 2020-06-16: qty 5

## 2020-06-16 MED ORDER — THROMBIN 5000 UNITS EX SOLR: OROMUCOSAL | Status: DC | PRN

## 2020-06-16 MED ORDER — SODIUM CHLORIDE 0.9% FLUSH: 3.0000 mL | INTRAVENOUS | Status: DC | PRN

## 2020-06-16 MED ORDER — ONDANSETRON HCL 4 MG/2ML IJ SOLN
INTRAMUSCULAR | Status: AC
  Filled 2020-06-16: qty 2

## 2020-06-16 MED ORDER — DULOXETINE HCL 30 MG PO CPEP
60.0000 mg | ORAL_CAPSULE | Freq: Every day | ORAL | Status: DC
  Administered 2020-06-16 – 2020-06-17 (×2): 60 mg via ORAL
  Filled 2020-06-16 (×2): qty 2

## 2020-06-16 MED ORDER — HYDROMORPHONE HCL 1 MG/ML IJ SOLN
0.5000 mg | INTRAMUSCULAR | Status: DC | PRN
Start: 2020-06-16 — End: 2020-06-18
  Administered 2020-06-16: 0.5 mg via INTRAVENOUS
  Filled 2020-06-16: qty 0.5

## 2020-06-16 MED ORDER — ADULT MULTIVITAMIN W/MINERALS CH
1.0000 | ORAL_TABLET | Freq: Every day | ORAL | Status: DC
  Administered 2020-06-17: 1 via ORAL
  Filled 2020-06-16: qty 1

## 2020-06-16 MED ORDER — ACETAMINOPHEN 500 MG PO TABS
1000.0000 mg | ORAL_TABLET | ORAL | Status: AC
  Administered 2020-06-16: 1000 mg via ORAL
  Filled 2020-06-16: qty 2

## 2020-06-16 MED ORDER — KETAMINE HCL 10 MG/ML IJ SOLN
INTRAMUSCULAR | Status: DC | PRN
  Administered 2020-06-16: 30 mg via INTRAVENOUS
  Administered 2020-06-16 (×2): 10 mg via INTRAVENOUS

## 2020-06-16 MED ORDER — SCOPOLAMINE 1 MG/3DAYS TD PT72
MEDICATED_PATCH | TRANSDERMAL | Status: AC
  Filled 2020-06-16: qty 1

## 2020-06-16 MED ORDER — SODIUM CHLORIDE 0.9 % IV SOLN: 250.0000 mL | INTRAVENOUS | Status: DC

## 2020-06-16 MED ORDER — DIAZEPAM 5 MG/ML IJ SOLN
2.5000 mg | INTRAMUSCULAR | Status: AC | PRN
  Administered 2020-06-16 (×2): 2.5 mg via INTRAVENOUS

## 2020-06-16 MED ORDER — ROCURONIUM BROMIDE 10 MG/ML (PF) SYRINGE
PREFILLED_SYRINGE | INTRAVENOUS | Status: AC
  Filled 2020-06-16: qty 10

## 2020-06-16 MED ORDER — ASCORBIC ACID 500 MG PO TABS
500.0000 mg | ORAL_TABLET | Freq: Every day | ORAL | Status: DC
  Administered 2020-06-17: 500 mg via ORAL
  Filled 2020-06-16: qty 1

## 2020-06-16 MED ORDER — CHLORHEXIDINE GLUCONATE CLOTH 2 % EX PADS: 6.0000 | MEDICATED_PAD | Freq: Once | CUTANEOUS | Status: DC

## 2020-06-16 MED ORDER — ZINC SULFATE 220 (50 ZN) MG PO CAPS
220.0000 mg | ORAL_CAPSULE | Freq: Every day | ORAL | Status: DC
  Administered 2020-06-17: 220 mg via ORAL
  Filled 2020-06-16 (×2): qty 1

## 2020-06-16 MED ORDER — DEXAMETHASONE 4 MG PO TABS
4.0000 mg | ORAL_TABLET | Freq: Four times a day (QID) | ORAL | Status: DC
  Administered 2020-06-16 – 2020-06-18 (×6): 4 mg via ORAL
  Filled 2020-06-16 (×6): qty 1

## 2020-06-16 MED ORDER — PROPOFOL 10 MG/ML IV BOLUS
INTRAVENOUS | Status: AC
  Filled 2020-06-16: qty 40

## 2020-06-16 MED ORDER — CEFAZOLIN SODIUM-DEXTROSE 2-4 GM/100ML-% IV SOLN
2.0000 g | Freq: Three times a day (TID) | INTRAVENOUS | Status: AC
  Administered 2020-06-16 – 2020-06-17 (×2): 2 g via INTRAVENOUS
  Filled 2020-06-16 (×2): qty 100

## 2020-06-16 MED ORDER — KETAMINE HCL 50 MG/5ML IJ SOSY
PREFILLED_SYRINGE | INTRAMUSCULAR | Status: AC
  Filled 2020-06-16: qty 5

## 2020-06-16 MED ORDER — LACTATED RINGERS IV SOLN: INTRAVENOUS | Status: DC

## 2020-06-16 MED ORDER — PROPOFOL 10 MG/ML IV BOLUS
INTRAVENOUS | Status: DC | PRN
  Administered 2020-06-16: 40 mg via INTRAVENOUS
  Administered 2020-06-16: 20 mg via INTRAVENOUS
  Administered 2020-06-16: 50 mg via INTRAVENOUS
  Administered 2020-06-16: 40 mg via INTRAVENOUS

## 2020-06-16 MED ORDER — 0.9 % SODIUM CHLORIDE (POUR BTL) OPTIME
TOPICAL | Status: DC | PRN
  Administered 2020-06-16 (×2): 1000 mL

## 2020-06-16 MED ORDER — SCOPOLAMINE 1 MG/3DAYS TD PT72
MEDICATED_PATCH | TRANSDERMAL | Status: DC | PRN
  Administered 2020-06-16: 1 via TRANSDERMAL

## 2020-06-16 MED ORDER — ROCURONIUM BROMIDE 10 MG/ML (PF) SYRINGE
PREFILLED_SYRINGE | INTRAVENOUS | Status: AC
  Filled 2020-06-16: qty 20

## 2020-06-16 MED ORDER — METHOCARBAMOL 500 MG PO TABS
ORAL_TABLET | ORAL | Status: AC
  Filled 2020-06-16: qty 1

## 2020-06-16 MED ORDER — ACETAMINOPHEN 650 MG RE SUPP: 650.0000 mg | RECTAL | Status: DC | PRN

## 2020-06-16 MED ORDER — LIDOCAINE 2% (20 MG/ML) 5 ML SYRINGE
INTRAMUSCULAR | Status: AC
  Filled 2020-06-16: qty 5

## 2020-06-16 MED ORDER — BUPIVACAINE HCL (PF) 0.25 % IJ SOLN
INTRAMUSCULAR | Status: DC | PRN
  Administered 2020-06-16: 7 mL

## 2020-06-16 MED ORDER — MENTHOL 3 MG MT LOZG: 1.0000 | LOZENGE | OROMUCOSAL | Status: DC | PRN

## 2020-06-16 MED ORDER — FENTANYL CITRATE (PF) 100 MCG/2ML IJ SOLN
25.0000 ug | INTRAMUSCULAR | Status: DC | PRN
  Administered 2020-06-16 (×2): 50 ug via INTRAVENOUS

## 2020-06-16 MED ORDER — CEFAZOLIN SODIUM-DEXTROSE 2-4 GM/100ML-% IV SOLN
2.0000 g | INTRAVENOUS | Status: AC
  Administered 2020-06-16: 2 g via INTRAVENOUS
  Filled 2020-06-16: qty 100

## 2020-06-16 MED ORDER — VANCOMYCIN HCL 1000 MG IV SOLR
INTRAVENOUS | Status: AC
  Filled 2020-06-16: qty 1000

## 2020-06-16 MED ORDER — ACETAMINOPHEN 160 MG/5ML PO SOLN: 1000.0000 mg | Freq: Once | ORAL | Status: AC | PRN

## 2020-06-16 MED ORDER — CHLORHEXIDINE GLUCONATE 0.12 % MT SOLN
15.0000 mL | Freq: Once | OROMUCOSAL | Status: AC
  Administered 2020-06-16: 15 mL via OROMUCOSAL
  Filled 2020-06-16: qty 15

## 2020-06-16 MED ORDER — GABAPENTIN 300 MG PO CAPS
300.0000 mg | ORAL_CAPSULE | ORAL | Status: AC
  Administered 2020-06-16: 300 mg via ORAL
  Filled 2020-06-16: qty 1

## 2020-06-16 MED ORDER — ONDANSETRON HCL 4 MG/2ML IJ SOLN
INTRAMUSCULAR | Status: DC | PRN
  Administered 2020-06-16: 4 mg via INTRAVENOUS

## 2020-06-16 MED ORDER — MIDAZOLAM HCL 5 MG/5ML IJ SOLN
INTRAMUSCULAR | Status: DC | PRN
  Administered 2020-06-16 (×2): 1 mg via INTRAVENOUS

## 2020-06-16 MED ORDER — POTASSIUM CHLORIDE IN NACL 20-0.9 MEQ/L-% IV SOLN: INTRAVENOUS | Status: DC

## 2020-06-16 MED ORDER — ACETAMINOPHEN 10 MG/ML IV SOLN: 1000.0000 mg | Freq: Once | INTRAVENOUS | Status: DC | PRN

## 2020-06-16 SURGICAL SUPPLY — 66 items
ADH SKN CLS APL DERMABOND .7 (GAUZE/BANDAGES/DRESSINGS) ×1
ALLOGRAFT BONE ARTHROCELL 5 (Bone Implant) ×1 IMPLANT
APL SKNCLS STERI-STRIP NONHPOA (GAUZE/BANDAGES/DRESSINGS) ×1
BASKET BONE COLLECTION (BASKET) ×1 IMPLANT
BENZOIN TINCTURE PRP APPL 2/3 (GAUZE/BANDAGES/DRESSINGS) ×2 IMPLANT
BLADE CLIPPER SURG (BLADE) IMPLANT
BUR CARBIDE MATCH 3.0 (BURR) ×2 IMPLANT
CANISTER SUCT 3000ML PPV (MISCELLANEOUS) ×2 IMPLANT
CNTNR URN SCR LID CUP LEK RST (MISCELLANEOUS) ×1 IMPLANT
CONT SPEC 4OZ STRL OR WHT (MISCELLANEOUS) ×2
COVER BACK TABLE 60X90IN (DRAPES) ×2 IMPLANT
COVER WAND RF STERILE (DRAPES) ×1 IMPLANT
DERMABOND ADVANCED (GAUZE/BANDAGES/DRESSINGS) ×1
DERMABOND ADVANCED .7 DNX12 (GAUZE/BANDAGES/DRESSINGS) ×1 IMPLANT
DRAPE C-ARM 42X72 X-RAY (DRAPES) ×3 IMPLANT
DRAPE C-ARMOR (DRAPES) ×1 IMPLANT
DRAPE LAPAROTOMY 100X72X124 (DRAPES) ×2 IMPLANT
DRAPE SURG 17X23 STRL (DRAPES) ×2 IMPLANT
DRSG OPSITE 4X5.5 SM (GAUZE/BANDAGES/DRESSINGS) ×1 IMPLANT
DRSG OPSITE POSTOP 4X6 (GAUZE/BANDAGES/DRESSINGS) ×1 IMPLANT
DURAPREP 26ML APPLICATOR (WOUND CARE) ×2 IMPLANT
ELECT REM PT RETURN 9FT ADLT (ELECTROSURGICAL) ×2
ELECTRODE REM PT RTRN 9FT ADLT (ELECTROSURGICAL) ×1 IMPLANT
EVACUATOR 1/8 PVC DRAIN (DRAIN) ×2 IMPLANT
FIBER BONE ALLOSYNC EXPAND 5 (Bone Implant) ×1 IMPLANT
GAUZE 4X4 16PLY RFD (DISPOSABLE) IMPLANT
GLOVE BIO SURGEON STRL SZ7 (GLOVE) ×1 IMPLANT
GLOVE BIO SURGEON STRL SZ8 (GLOVE) ×4 IMPLANT
GLOVE SURG POLYISO LF SZ6 (GLOVE) ×6 IMPLANT
GLOVE SURG POLYISO LF SZ7 (GLOVE) ×2 IMPLANT
GLOVE SURG POLYISO LF SZ7.5 (GLOVE) ×1 IMPLANT
GLOVE SURG UNDER POLY LF SZ6.5 (GLOVE) ×4 IMPLANT
GLOVE SURG UNDER POLY LF SZ7 (GLOVE) ×1 IMPLANT
GOWN STRL REUS W/ TWL LRG LVL3 (GOWN DISPOSABLE) IMPLANT
GOWN STRL REUS W/ TWL XL LVL3 (GOWN DISPOSABLE) ×2 IMPLANT
GOWN STRL REUS W/TWL 2XL LVL3 (GOWN DISPOSABLE) IMPLANT
GOWN STRL REUS W/TWL LRG LVL3 (GOWN DISPOSABLE) ×10
GOWN STRL REUS W/TWL XL LVL3 (GOWN DISPOSABLE) ×4
HEMOSTAT POWDER KIT SURGIFOAM (HEMOSTASIS) ×1 IMPLANT
KIT BASIN OR (CUSTOM PROCEDURE TRAY) ×2 IMPLANT
KIT TURNOVER KIT B (KITS) ×2 IMPLANT
MILL MEDIUM DISP (BLADE) IMPLANT
NDL HYPO 25X1 1.5 SAFETY (NEEDLE) ×1 IMPLANT
NEEDLE HYPO 25X1 1.5 SAFETY (NEEDLE) ×2 IMPLANT
NS IRRIG 1000ML POUR BTL (IV SOLUTION) ×3 IMPLANT
PACK LAMINECTOMY NEURO (CUSTOM PROCEDURE TRAY) ×2 IMPLANT
PAD ARMBOARD 7.5X6 YLW CONV (MISCELLANEOUS) ×6 IMPLANT
ROD LORD LIPPED TI 5.5X70 (Rod) ×2 IMPLANT
SCREW POLYAXIAL TULIP (Screw) ×6 IMPLANT
SCREW SHANK MOD 6.5X45 (Screw) ×6 IMPLANT
SET SCREW (Screw) ×12 IMPLANT
SET SCREW SPNE (Screw) IMPLANT
SPACER BATT PS 9X25X11 (Spacer) ×2 IMPLANT
SPACER PEEK PS 25X9MM 9MM 5DEG (Spacer) ×2 IMPLANT
SPONGE LAP 4X18 RFD (DISPOSABLE) IMPLANT
SPONGE SURGIFOAM ABS GEL 100 (HEMOSTASIS) ×2 IMPLANT
STRIP CLOSURE SKIN 1/2X4 (GAUZE/BANDAGES/DRESSINGS) ×3 IMPLANT
SUT VIC AB 0 CT1 18XCR BRD8 (SUTURE) ×1 IMPLANT
SUT VIC AB 0 CT1 8-18 (SUTURE) ×2
SUT VIC AB 2-0 CP2 18 (SUTURE) ×2 IMPLANT
SUT VIC AB 3-0 SH 8-18 (SUTURE) ×4 IMPLANT
SYR CONTROL 10ML LL (SYRINGE) ×2 IMPLANT
TOWEL GREEN STERILE (TOWEL DISPOSABLE) ×2 IMPLANT
TOWEL GREEN STERILE FF (TOWEL DISPOSABLE) ×2 IMPLANT
TRAY FOLEY MTR SLVR 16FR STAT (SET/KITS/TRAYS/PACK) ×2 IMPLANT
WATER STERILE IRR 1000ML POUR (IV SOLUTION) ×2 IMPLANT

## 2020-06-16 NOTE — Anesthesia Postprocedure Evaluation (Signed)
Anesthesia Post Note  Patient: [name]  Procedure(s) Performed: Posterior Lumbar Interbody Fusion Lumbar three-four, Lumbar four-five, removal hardware Lumbar five-sacral one (N/A Back)     Patient location during evaluation: PACU Anesthesia Type: General Level of consciousness: awake and alert Pain management: pain level controlled Vital Signs Assessment: post-procedure vital signs reviewed and stable Respiratory status: spontaneous breathing, nonlabored ventilation, respiratory function stable and patient connected to nasal cannula oxygen Cardiovascular status: blood pressure returned to baseline and stable Postop Assessment: no apparent nausea or vomiting Anesthetic complications: no   No complications documented.  Last Vitals:  Vitals:   06/16/20 1440 06/16/20 1455  BP: 111/63 103/66  Pulse: 63 68  Resp: 14 17  Temp:    SpO2: 100% 94%    Last Pain:  Vitals:   06/16/20 1425  TempSrc:   PainSc: (P) Asleep                 Gustave Lindeman

## 2020-06-16 NOTE — Anesthesia Preprocedure Evaluation (Signed)
Anesthesia Evaluation  Patient identified by MRN, date of birth, ID band Patient awake    Reviewed: Allergy & Precautions, NPO status , Patient's Chart, lab work & pertinent test results  History of Anesthesia Complications (+) PONV and history of anesthetic complications  Airway Mallampati: I  TM Distance: >3 FB Neck ROM: Full    Dental  (+) Dental Advisory Given, Teeth Intact   Pulmonary former smoker,  Covid-19 Nucleic Acid Test Results Lab Results      Component                Value               Date                      [place]              NEGATIVE            06/13/2020                [place]              Not Detected        11/03/2018              breath sounds clear to auscultation       Cardiovascular negative cardio ROS   Rhythm:Regular     Neuro/Psych neg Seizures PSYCHIATRIC DISORDERS Anxiety Depression  Neuromuscular disease    GI/Hepatic Neg liver ROS, hiatal hernia, GERD  Medicated and Controlled,  Endo/Other  negative endocrine ROS  Renal/GU negative Renal ROS     Musculoskeletal  (+) Arthritis , Fibromyalgia -  Abdominal   Peds  Hematology Lab Results      Component                Value               Date                      WBC                      7.1                 06/13/2020                HGB                      13.1                06/13/2020                HCT                      41.7                06/13/2020                MCV                      98.6                06/13/2020                PLT                      317  06/13/2020              Anesthesia Other Findings   Reproductive/Obstetrics                             Anesthesia Physical Anesthesia Plan  ASA: II  Anesthesia Plan: General   Post-op Pain Management:    Induction: Intravenous  PONV Risk Score and Plan: 4 or greater and Ondansetron, Dexamethasone,  Propofol infusion, TIVA and Scopolamine patch - Pre-op  Airway Management Planned: Oral ETT  Additional Equipment: None  Intra-op Plan:   Post-operative Plan: Extubation in OR  Informed Consent: I have reviewed the patients History and Physical, chart, labs and discussed the procedure including the risks, benefits and alternatives for the proposed anesthesia with the patient or authorized representative who has indicated his/her understanding and acceptance.     Dental advisory given  Plan Discussed with: CRNA and Surgeon  Anesthesia Plan Comments:         Anesthesia Quick Evaluation

## 2020-06-16 NOTE — Transfer of Care (Signed)
Immediate Anesthesia Transfer of Care Note  Patient: [name]  Procedure(s) Performed: Posterior Lumbar Interbody Fusion Lumbar three-four, Lumbar four-five, removal hardware Lumbar five-sacral one (N/A Back)  Patient Location: PACU  Anesthesia Type:General  Level of Consciousness: drowsy  Airway & Oxygen Therapy: Patient Spontanous Breathing and Patient connected to face mask oxygen  Post-op Assessment: Report given to RN and Post -op Vital signs reviewed and stable  Post vital signs: Reviewed and stable  Last Vitals:  Vitals Value Taken Time  BP 105/62 06/16/20 1324  Temp    Pulse 66 06/16/20 1325  Resp 19 06/16/20 1325  SpO2 100 % 06/16/20 1325  Vitals shown include unvalidated device data.  Last Pain:  Vitals:   06/16/20 0812  TempSrc:   PainSc: 0-No pain         Complications: No complications documented.

## 2020-06-16 NOTE — Anesthesia Procedure Notes (Signed)
Procedure Name: Intubation Date/Time: 06/16/2020 8:57 AM Performed by: Bryson Corona, CRNA Pre-anesthesia Checklist: Patient identified, Emergency Drugs available, Suction available and Patient being monitored Patient Re-evaluated:Patient Re-evaluated prior to induction Oxygen Delivery Method: Circle System Utilized Preoxygenation: Pre-oxygenation with 100% oxygen Induction Type: IV induction Ventilation: Mask ventilation without difficulty Laryngoscope Size: Glidescope and 3 Tube type: Oral Tube size: 7.5 mm Number of attempts: 1 Airway Equipment and Method: Stylet and Oral airway Placement Confirmation: ETT inserted through vocal cords under direct vision,  positive ETCO2 and breath sounds checked- equal and bilateral Secured at: 21 cm Tube secured with: Tape Dental Injury: Teeth and Oropharynx as per pre-operative assessment  Comments: Inserted by Paulina Fusi, SRNA

## 2020-06-16 NOTE — Op Note (Signed)
06/16/2020  1:17 PM  PATIENT:  [name]  70 y.o. 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 Decompressive lumbar laminectomy Hemi facetectomy foraminotomies L3-4 and L4-5 with resection of right-sided synovial cyst at L4-5 requiring more work than would be required for a simple exposure of the disk for PLIF in order to adequately decompress the neural elements and address the spinal stenosis 2. Posterior lumbar interbody fusion L3-4 and L4-5 using peek interbody cages packed with morcellized allograft and autograft  3. Posterior fixation L4-5 inclusive using atec pedicle screws.  4. Intertransverse arthrodesis L3-5 using morcellized autograft and allograft. 5.  Removal of instrumentation L5-S1  SURGEON:  Sherley Bounds, MD  ASSISTANTS: Dr. Christella Noa  ANESTHESIA:  General  EBL: 300 ml  Total I/O In: 1700 [I.V.:1600; IV Piggyback:100] Out: 625 [Urine:325; Blood:300]  BLOOD ADMINISTERED:none  DRAINS: none   INDICATION FOR PROCEDURE: This patient presented with back and right leg pain more than left leg pain. Imaging revealed adjacent level spondylolisthesis with spinal stenosis and right-sided synovial cyst at L4-5 and some spinal stenosis at L3-4. The patient tried a reasonable attempt at conservative medical measures without relief. I recommended decompression and instrumented fusion to address the stenosis as well as the segmental  instability.  Patient understood the risks, benefits, and alternatives and potential outcomes and wished to proceed.  PROCEDURE DETAILS:  The patient was brought to the operating room. After induction of generalized endotracheal anesthesia the patient was rolled into the prone position on chest rolls and all pressure points were padded. The patient's  lumbar region was cleaned and then prepped with DuraPrep and draped in the usual sterile fashion. Anesthesia was injected and then a dorsal midline incision was made and carried down to the lumbosacral fascia. The fascia was opened and the paraspinous musculature was taken down in a subperiosteal fashion to expose L3-4 and L4-5 as well as the previously placed instrumentation. A self-retaining retractor was placed. Intraoperative fluoroscopy confirmed my level, and I started with removal of the old instrumentation.  The locking caps were removed, the rods were removed and the L5 pedicle screws were removed.  The S1 pedicle screws were left in place.  I then turned my attention to the decompression and complete lumbar laminectomies, hemi- facetectomies, and foraminotomies were performed at L3-4 and L4-5.  My nurse practitioner was directly involved in the decompression and exposure of the neural elements.  She had a previous decompressive laminectomy at L4-5 and there was a large synovial cyst at L4-5 on the right.  We spent considerable time dissecting the bony edges away from the scar tissue and previous laminotomy site.  We undercut the lamina and performed a facetectomy and we removed the facet.  The synovial cyst was then easily identified and removed en bloc.  The patient had significant spinal stenosis and this required more work than would be required for a simple exposure of the disc for posterior lumbar interbody fusion which would only require a limited laminotomy. Much more generous decompression and generous foraminotomy was undertaken in order to adequately decompress the neural elements and address the patient's leg pain. The yellow ligament was removed to expose the underlying dura and nerve roots, and generous foraminotomies were performed to adequately decompress the neural elements. Both the exiting and traversing nerve roots were decompressed on  both sides until a coronary dilator passed easily  along the nerve roots. Once the decompression was complete, I turned my attention to the posterior lower lumbar interbody fusion. The epidural venous vasculature was coagulated and cut sharply. Disc space was incised and the initial discectomy was performed with pituitary rongeurs. The disc space was distracted with sequential distractors to a height of 11 mm at L3-4 and 9 mm at L4-5. We then used a series of scrapers and shavers to prepare the endplates for fusion. The midline was prepared with Epstein curettes. Once the complete discectomy was finished, we packed an appropriate sized interbody cage with local autograft and morcellized allograft, gently retracted the nerve root, and tapped the cage into position at L3-4 and L4-5.  The midline between the cages was packed with morselized autograft and allograft. We then turned our attention to the placement of the lower pedicle screws. The pedicle screw entry zones were identified utilizing surface landmarks and fluoroscopy. I probed into each pedicle utilizing the pedicle probe, and tapped each pedicle with the appropriate 5.5 tap. We palpated with a ball probe to assure no break in the cortex. We then placed 6.5 x 45 mm pedicle screws into the pedicles bilaterally at L3 and L4 and replaced the old screws with 6.5 x 45 mm pedicle screws at L5.  My nurse practitioner assisted in placement of the pedicle screws.  We then decorticated the transverse processes and laid a mixture of morcellized autograft and allograft out over these to perform intertransverse arthrodesis at L3-5. We then placed lordotic rods into the multiaxial screw heads of the pedicle screws and locked these in position with the locking caps and anti-torque device. We then checked our construct with AP and lateral fluoroscopy. Irrigated with copious amounts of bacitracin-containing saline solution. Inspected the nerve roots once again to assure adequate decompression, lined to the dura with Gelfoam,  placed powdered vancomycin into the wound, and then we closed the muscle and the fascia with 0 Vicryl.  Dr. Christella Noa assisted with the closure .  We closed the subcutaneous tissues with 2-0 Vicryl and subcuticular tissues with 3-0 Vicryl. The skin was closed with benzoin and Steri-Strips. Dressing was then applied, the patient was awakened from general anesthesia and transported to the recovery room in stable condition. At the end of the procedure all sponge, needle and instrument counts were correct.   PLAN OF CARE: admit to inpatient  PATIENT DISPOSITION:  PACU - hemodynamically stable.   Delay start of Pharmacological VTE agent (>24hrs) due to surgical blood loss or risk of bleeding:  yes

## 2020-06-16 NOTE — H&P (Signed)
Subjective: Patient is a 70 y.o. female admitted for adjacent level stenosis. Onset of symptoms was several months ago, gradually worsening since that time.  The pain is rated severe, unremitting, and is located at the across the lower back and radiates to RLE > LLE. The pain is described as aching and occurs all day. The symptoms have been progressive. Symptoms are exacerbated by exercise and standing. MRI or CT showed adjacent level stenosis   Past Medical History:  Diagnosis Date  . Anemia   . Anxiety 2000  . Arthritis   . Depression 2000  . Fibromyalgia   . GERD (gastroesophageal reflux disease)   . Headache(784.0)   . PONV (postoperative nausea and vomiting)   . Seasonal allergies     Past Surgical History:  Procedure Laterality Date  . ABDOMINAL HYSTERECTOMY  1984  . BACK SURGERY  2006, 2008   lower back, fusion in l5-s1  . CHOLECYSTECTOMY  2003  . ERCP W/ SPHINCTEROTOMY AND BALLOON DILATION  2005   pancreatitis   . LUMBAR LAMINECTOMY/DECOMPRESSION MICRODISCECTOMY N/A 05/31/2012   Procedure: MICRO LUMBAR DECOMPRESSION L4 - L5, L5 - S1 ;  Surgeon: Johnn Hai, MD;  Location: [facility];  Service: Orthopedics;  Laterality: N/A;  . LUMBAR LAMINECTOMY/DECOMPRESSION MICRODISCECTOMY Right 02/06/2014   Procedure: LUMBAR DECOMPRESSION L3-L4, REDO LUMBAR DECOMPRESSION L4-L5 ON THE RIGHT;  Surgeon: Johnn Hai, MD;  Location: [facility];  Service: Orthopedics;  Laterality: Right;  . NECK SURGERY  2001,2007   herniated disc- cervical fusion(limited side to side mobility)  . SHOULDER ARTHROSCOPY Bilateral 2010, 2011   debridement    Prior to Admission medications   Medication Sig Start Date End Date Taking? Authorizing Provider  ascorbic acid (VITAMIN C) 500 MG tablet Take 500 mg by mouth daily.   Yes [provider]  Calcium Carb-Cholecalciferol (CALCIUM 600 + D PO) Take 1 tablet by mouth daily.   Yes [provider]  Cholecalciferol (VITAMIN D) 125 MCG (5000 UT) CAPS  Take 5,000 Units by mouth daily.   Yes [provider]  docusate sodium (COLACE) 100 MG capsule Take 1 capsule (100 mg total) by mouth 2 (two) times daily as needed for mild constipation. Patient taking differently: Take 100 mg by mouth daily. 02/06/14  Yes Susa Day, MD  DULoxetine (CYMBALTA) 60 MG capsule Take 60 mg by mouth daily.   Yes [provider]  Ferrous Sulfate (SLOW RELEASE IRON PO) Take 1 tablet by mouth daily.   Yes [provider]  fexofenadine (ALLEGRA) 180 MG tablet Take 180 mg by mouth daily.   Yes [provider]  Ginkgo Biloba 60 MG TABS Take 60 mg by mouth in the morning and at bedtime.   Yes [provider]  lidocaine (LIDODERM) 5 % Place 1-3 patches onto the skin daily as needed (pain). Remove & Discard patch within 12 hours or as directed by MD   Yes [provider]  magnesium gluconate (MAGONATE) 500 MG tablet Take 500 mg by mouth daily.   Yes [provider]  methocarbamol (ROBAXIN) 500 MG tablet Take 500 mg by mouth every 8 (eight) hours as needed for muscle spasms. 12/31/16  Yes [provider]  Multiple Vitamins-Minerals (MULTIVITAMIN PO) Take 1 tablet by mouth daily.   Yes [provider]  oxyCODONE-acetaminophen (PERCOCET) 10-325 MG tablet Take 1 tablet by mouth in the morning, at noon, in the evening, and at bedtime.   Yes [provider]  pantoprazole (PROTONIX) 40 MG tablet  Take 40 mg by mouth 2 (two) times daily.    Yes [provider]  Polyethyl Glycol-Propyl Glycol (SYSTANE ULTRA OP) Place 1 drop into both eyes 2 (two) times daily as needed (dry eyes).   Yes [provider]  polyethylene glycol (MIRALAX / GLYCOLAX) packet Take 17 g by mouth daily.   Yes [provider]  potassium gluconate 595 (99 K) MG TABS tablet Take 99 mg by mouth daily.   Yes [provider]  TURMERIC CURCUMIN PO Take 1,000 mg by mouth daily.   Yes [provider]  vitamin B-12 (CYANOCOBALAMIN) 1000 MCG tablet Take 1,000 mcg by mouth in the morning and at bedtime.   Yes [provider]  zinc gluconate 50 MG tablet Take 50 mg by mouth daily.   Yes [provider]  gabapentin (NEURONTIN) 300 MG capsule Take 1 capsule (300 mg total) by mouth 3 (three) times daily. Take 2 capsules (600mg ) at breakfast, one at lunch (300mg ), and 2 tablets (600mg ) at bedtime. Patient taking differently: Take 300-600 mg by mouth See admin instructions. Take 2 capsules (600mg ) at breakfast, one at lunch (300mg ), and 2 tablets (600mg ) at bedtime. 08/05/16   Gareth Morgan, MD  meloxicam (MOBIC) 15 MG tablet Take 1 tablet (15 mg total) by mouth daily. Patient not taking: No sig reported 01/18/17   Hyatt, Max T, DPM  methylPREDNISolone (MEDROL DOSEPAK) 4 MG TBPK tablet 6 day dose pack - take as directed Patient not taking: No sig reported 01/18/17   Hyatt, Max T, DPM  predniSONE (DELTASONE) 20 MG tablet 3 Tabs PO Days 1-3, then 2 tabs PO Days 4-6, then 1 tab PO Day 7-9, then Half Tab PO Day 10-12 Patient not taking: No sig reported 09/25/16   Carlisle Cater, PA-C   No Known Allergies  Social History   Tobacco Use  . Smoking status: Former Smoker    Packs/day: 0.50    Years: 3.00    Pack years: 1.50    Types: Cigarettes    Quit date: 02/02/1972    Years since quitting: 48.4  . Smokeless tobacco: Never Used  Substance Use Topics  . Alcohol use: No    History reviewed. No pertinent family history.   Review of Systems  Positive ROS: neg  All other systems have been reviewed and were otherwise negative with the exception of those mentioned in the HPI and as above.  Objective: Vital signs in last 24 hours: Temp:  [98.4 F (36.9 C)] 98.4 F (36.9 C) (05/16 0745) Pulse Rate:  [73] 73 (05/16 0745) Resp:  [17] 17 (05/16 0745) BP: (148)/(72) 148/72 (05/16 0745) SpO2:  [99 %] 99 % (05/16 0745) Weight:  [69.4 kg] 69.4 kg (05/16  0745)  General Appearance: Alert, cooperative, no distress, appears stated age Head: Normocephalic, without obvious abnormality, atraumatic Eyes: PERRL, conjunctiva/corneas clear, EOM's intact    Neck: Supple, symmetrical, trachea midline Back: Symmetric, no curvature, ROM normal, no CVA tenderness Lungs:  respirations unlabored Heart: Regular rate and rhythm Abdomen: Soft, non-tender Extremities: Extremities normal, atraumatic, no cyanosis or edema Pulses: 2+ and symmetric all extremities Skin: Skin color, texture, turgor normal, no rashes or lesions  NEUROLOGIC:   Mental status: Alert and oriented x4,  no aphasia, good attention span, fund of knowledge, and memory Motor Exam - grossly normal Sensory Exam - grossly normal Reflexes: 1+ Coordination - grossly normal Gait - grossly normal Balance - grossly normal Cranial Nerves: I: smell Not tested  II: visual acuity  OS: nl    OD: nl  II: visual fields Full to confrontation  II: pupils Equal, round, reactive to light  III,VII: ptosis None  III,IV,VI: extraocular muscles  Full ROM  V: mastication Normal  V: facial light touch sensation  Normal  V,VII: corneal reflex  Present  VII: facial muscle function - upper  Normal  VII: facial muscle function - lower Normal  VIII: hearing Not tested  IX: soft palate elevation  Normal  IX,X: gag reflex Present  XI: trapezius strength  5/5  XI: sternocleidomastoid strength 5/5  XI: neck flexion strength  5/5  XII: tongue strength  Normal    Data Review Lab Results  Component Value Date   WBC 7.1 06/13/2020   HGB 13.1 06/13/2020   HCT 41.7 06/13/2020   MCV 98.6 06/13/2020   PLT 317 06/13/2020   Lab Results  Component Value Date   NA 137 06/13/2020   K 4.6 06/13/2020   CL 100 06/13/2020   CO2 32 06/13/2020   BUN 12 06/13/2020   CREATININE 0.79 06/13/2020   GLUCOSE 97 06/13/2020   Lab Results  Component Value Date   INR 0.9 06/13/2020    Assessment/Plan:  Estimated  body mass index is 25.07 kg/m as calculated from the following:   Height as of this encounter: 5' 5.5" (1.664 m).   Weight as of this encounter: 69.4 kg. Patient admitted for PLIF L3-4 L4-5. Patient has failed a reasonable attempt at conservative therapy.  I explained the condition and procedure to the patient and answered any questions.  Patient wishes to proceed with procedure as planned. Understands risks/ benefits and typical outcomes of procedure.   Eustace Moore 06/16/2020 7:59 AM

## 2020-06-17 NOTE — Progress Notes (Signed)
She is doing well.  Her back is appropriately sore.  She has no leg pain or numbness tingling or weakness.  She has walked at least once.  She has good dorsi and plantar flexion.  Drain is in place.  Continue to mobilize with therapy today.  Continue pain control.

## 2020-06-17 NOTE — Evaluation (Signed)
Physical Therapy Evaluation Patient Details Name: [name] MRN: [id number] DOB: Jul 21, 1950 Today's Date: 06/17/2020   History of Present Illness  70 y.o. 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   PMHx significant for anxiety/depression, fibromyalgia, and multiple previous spinal surgeries with most recent in 2016.    Clinical Impression  Pt admitted with above diagnosis. At the time of PT eval, pt was able to demonstrate transfers and ambulation with gross modified independence to supervision for safety and no AD. Pt was educated on precautions, brace application/wearing schedule, appropriate activity progression, and car transfer. Pt currently with functional limitations due to the deficits listed below (see PT Problem List). Pt will benefit from skilled PT to increase their independence and safety with mobility to allow discharge to the venue listed below.      Follow Up Recommendations No PT follow up;Supervision for mobility/OOB    Equipment Recommendations  None recommended by PT    Recommendations for Other Services       Precautions / Restrictions Precautions Precautions: Back;Other (comment) (Hemovac) Precaution Booklet Issued: Yes (comment) Precaution Comments: Patient able to recall 3/3 back precuations from previous surgeries. Good return demo during functional tasks. Required Braces or Orthoses: Spinal Brace Spinal Brace: Lumbar corset;Applied in standing position Restrictions Weight Bearing Restrictions: No      Mobility  Bed Mobility Overal bed mobility: Modified Independent             General bed mobility comments: Pt was received sitting up in the recliner chair. Reviewed log roll technique.    Transfers Overall transfer level: Modified independent Equipment used: None Transfers: Sit to/from Stand Sit to Stand: Modified independent (Device/Increase time)         General transfer comment: No assist required.  Pt was cued for optimal posture but otherwise at a mod I level.  Ambulation/Gait Ambulation/Gait assistance: Supervision Gait Distance (Feet): 400 Feet Assistive device: None Gait Pattern/deviations: Step-through pattern;Decreased stride length Gait velocity: Decreased Gait velocity interpretation: 1.31 - 2.62 ft/sec, indicative of limited community ambulator General Gait Details: VC's for improved posture throughout gait training. Pt appears guarded but with no unsteadiness or overt LOB noted.  Stairs Stairs: Yes Stairs assistance: Supervision Stair Management: One rail Left;Step to pattern;Forwards Number of Stairs: 5 General stair comments: VC's for sequencing and general safety. No assist required.  Wheelchair Mobility    Modified Rankin (Stroke Patients Only)       Balance Overall balance assessment: Mild deficits observed, not formally tested                                           Pertinent Vitals/Pain Pain Assessment: Faces Pain Score: 5  Faces Pain Scale: Hurts little more Pain Location: Low back (incisional) Pain Descriptors / Indicators: Sore;Operative site guarding Pain Intervention(s): Limited activity within patient's tolerance;Monitored during session;Repositioned    Home Living Family/patient expects to be discharged to:: Private residence Living Arrangements: Spouse/significant other Available Help at Discharge: Family;Available 24 hours/day Type of Home: House Home Access: Stairs to enter Entrance Stairs-Rails: Right;Left;Can reach both Entrance Stairs-Number of Steps: 2 Home Layout: One level Home Equipment: Shower seat;Cane - single point Additional Comments: Patient reports shower seat is in storage    Prior Function Level of Independence: Independent with assistive device(s)         Comments: Mostly independent with use of  SPC; husband assist with donning footwear; drives; shared responsibility for IADLs.     Hand  Dominance   Dominant Hand: Right    Extremity/Trunk Assessment   Upper Extremity Assessment Upper Extremity Assessment: Defer to OT evaluation    Lower Extremity Assessment Lower Extremity Assessment: Generalized weakness (Mildly weak consistent with pre-op diagnosis)    Cervical / Trunk Assessment Cervical / Trunk Assessment: Other exceptions Cervical / Trunk Exceptions: s/p lumbar surgery  Communication   Communication: No difficulties  Cognition Arousal/Alertness: Awake/alert Behavior During Therapy: WFL for tasks assessed/performed Overall Cognitive Status: Within Functional Limits for tasks assessed                                        General Comments General comments (skin integrity, edema, etc.): Clean, dry dressing at incision. Hemovac intact at conclusion of session.    Exercises     Assessment/Plan    PT Assessment Patient needs continued PT services  PT Problem List Decreased strength;Decreased activity tolerance;Decreased balance;Decreased mobility;Decreased knowledge of use of DME;Decreased safety awareness;Decreased knowledge of precautions;Pain       PT Treatment Interventions DME instruction;Gait training;Stair training;Functional mobility training;Therapeutic activities;Therapeutic exercise;Neuromuscular re-education;Patient/family education    PT Goals (Current goals can be found in the Care Plan section)  Acute Rehab PT Goals Patient Stated Goal: To return home. PT Goal Formulation: With patient Time For Goal Achievement: 06/24/20 Potential to Achieve Goals: Good    Frequency Min 5X/week   Barriers to discharge        Co-evaluation               AM-PAC PT "6 Clicks" Mobility  Outcome Measure Help needed turning from your back to your side while in a flat bed without using bedrails?: None Help needed moving from lying on your back to sitting on the side of a flat bed without using bedrails?: None Help needed moving  to and from a bed to a chair (including a wheelchair)?: None Help needed standing up from a chair using your arms (e.g., wheelchair or bedside chair)?: None Help needed to walk in hospital room?: None Help needed climbing 3-5 steps with a railing? : A Little 6 Click Score: 23    End of Session Equipment Utilized During Treatment: Gait belt;Back brace Activity Tolerance: Patient tolerated treatment well Patient left: in chair;with call bell/phone within reach Nurse Communication: Mobility status PT Visit Diagnosis: Unsteadiness on feet (R26.81);Pain Pain - part of body:  (back/incision site)    Time: 5188-4166 PT Time Calculation (min) (ACUTE ONLY): 19 min   Charges:   PT Evaluation $PT Eval Low Complexity: 1 Low          Rolinda Roan, PT, DPT Acute Rehabilitation Services Pager: [phone] Office: [phone]   Thelma Comp 06/17/2020, 10:36 AM

## 2020-06-17 NOTE — Evaluation (Signed)
Occupational Therapy Evaluation Patient Details Name: [name] MRN: [id number] DOB: 12/03/1950 Today's Date: 06/17/2020    History of Present Illness 70 y.o. 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   PMHx significant for anxiety/depression, fibromyalgia, and multiple previous spinal surgeries with most recent in 2016.   Clinical Impression   PTA patient was living with her spouse in a private residence and was independent with ADLs/IADLs with intermittent use of SPC. Patient currently near baseline level of function demonstrating bed mobility, functional transfers, and grooming standing at sink with supervision A to Mod I without AD. OT provided education on spinal precautions, home set-up to maximize safety and independence with self-care tasks, and acquisition/use of AE. Patient expressed verbal understanding. Patient very familiar with process s/p spinal surgery secondary to medical history. Patient does not require continued acute occupational therapy services with OT to sign off at this time.      Follow Up Recommendations  No OT follow up    Equipment Recommendations  3 in 1 bedside commode    Recommendations for Other Services       Precautions / Restrictions Precautions Precautions: Back;Other (comment) (Hemovac) Precaution Booklet Issued: Yes (comment) Precaution Comments: Patient able to recall 3/3 back precuations from previous surgeries. Good return demo during functional tasks. Required Braces or Orthoses: Spinal Brace Spinal Brace: Lumbar corset;Applied in standing position Restrictions Weight Bearing Restrictions: No      Mobility Bed Mobility Overal bed mobility: Modified Independent             General bed mobility comments: Mod I with HOB slightly elevated. Good recall of log rolling technique.    Transfers Overall transfer level: Needs assistance Equipment used: None Transfers: Sit to/from Stand Sit to Stand:  Modified independent (Device/Increase time)         General transfer comment: Sit to stand from EOB positioned in lowest setting with Mod I and slightly increased time.    Balance Overall balance assessment: Mild deficits observed, not formally tested                                         ADL either performed or assessed with clinical judgement   ADL Overall ADL's : Needs assistance/impaired     Grooming: Modified independent;Standing Grooming Details (indicate cue type and reason): 2/3 grooming tasks standing at sink level with Mod I.             Lower Body Dressing: Modified independent;Sit to/from stand Lower Body Dressing Details (indicate cue type and reason): Able to don LB clothing with Mod I. Toilet Transfer: Copy Details (indicate cue type and reason): Simulated with transfer to recliner with supervision A for safety.                 Vision   Vision Assessment?: No apparent visual deficits     Perception     Praxis      Pertinent Vitals/Pain Pain Assessment: 0-10 Pain Score: 5  Pain Location: Low back (incisional) Pain Descriptors / Indicators: Aching;Sore Pain Intervention(s): Limited activity within patient's tolerance;Monitored during session;Premedicated before session;Repositioned     Hand Dominance Right   Extremity/Trunk Assessment Upper Extremity Assessment Upper Extremity Assessment: Overall WFL for tasks assessed   Lower Extremity Assessment Lower Extremity Assessment: Defer to PT evaluation   Cervical / Trunk Assessment Cervical / Trunk  Assessment: Other exceptions Cervical / Trunk Exceptions: s/p lumbar surgery   Communication Communication Communication: No difficulties   Cognition Arousal/Alertness: Awake/alert Behavior During Therapy: WFL for tasks assessed/performed Overall Cognitive Status: Within Functional Limits for tasks assessed                                      General Comments  Clean, dry dressing at incision. Hemovac intact at conclusion of session.    Exercises     Shoulder Instructions      Home Living Family/patient expects to be discharged to:: Private residence Living Arrangements: Spouse/significant other Available Help at Discharge: Family;Available 24 hours/day Type of Home: House Home Access: Stairs to enter [employer] of Steps: 2 Entrance Stairs-Rails: Right;Left;Can reach both       Bathroom Shower/Tub: Teacher, early years/pre: Standard     Home Equipment: Marine scientist - single point   Additional Comments: Patient reports shower seat is in storage      Prior Functioning/Environment Level of Independence: Independent with assistive device(s)        Comments: Mostly independent with use of SPC; husband assist with donning footwear; drives; shared responsibility for IADLs.        OT Problem List: Pain      OT Treatment/Interventions:      OT Goals(Current goals can be found in the care plan section) Acute Rehab OT Goals Patient Stated Goal: To return home. OT Goal Formulation: With patient Time For Goal Achievement: 07/01/20 Potential to Achieve Goals: Good  OT Frequency:     Barriers to D/C:            Co-evaluation              AM-PAC OT "6 Clicks" Daily Activity     Outcome Measure Help from another person eating meals?: None Help from another person taking care of personal grooming?: None Help from another person toileting, which includes using toliet, bedpan, or urinal?: None Help from another person bathing (including washing, rinsing, drying)?: A Little Help from another person to put on and taking off regular upper body clothing?: None Help from another person to put on and taking off regular lower body clothing?: A Little 6 Click Score: 22   End of Session Nurse Communication: Mobility status  Activity Tolerance: Patient tolerated  treatment well Patient left: in chair;with call bell/phone within reach  OT Visit Diagnosis: Unsteadiness on feet (R26.81)                Time: 3016-0109 OT Time Calculation (min): 14 min Charges:  OT General Charges $OT Visit: 1 Visit OT Evaluation $OT Eval Low Complexity: 1 Low  Elif Yonts H. OTR/L Supplemental OT, Department of rehab services [phone]  Deeann Saint R Howerton-Davis 06/17/2020, 8:36 AM

## 2020-06-18 MED ORDER — OXYCODONE-ACETAMINOPHEN 10-325 MG PO TABS: 1.0000 | ORAL_TABLET | ORAL | 0 refills | Status: DC | PRN

## 2020-06-18 NOTE — Plan of Care (Signed)
  Problem: Safety: Goal: Ability to remain free from injury will improve Outcome: Adequate for Discharge   Problem: Education: Goal: Ability to verbalize activity precautions or restrictions will improve Outcome: Adequate for Discharge Goal: Knowledge of the prescribed therapeutic regimen will improve Outcome: Adequate for Discharge Goal: Understanding of discharge needs will improve Outcome: Adequate for Discharge   Problem: Activity: Goal: Ability to avoid complications of mobility impairment will improve Outcome: Adequate for Discharge

## 2020-06-18 NOTE — Discharge Instructions (Signed)
Wound Care Keep incision covered and dry for two days.   Do not put any creams, lotions, or ointments on incision. Leave steri-strips on back.  They will fall off by themselves. Activity Walk each and every day, increasing distance each day. No lifting greater than 5 lbs.  Avoid excessive neck motion. No driving for 2 weeks; may ride as a passenger locally. If provided with back brace, wear when out of bed.  It is not necessary to wear brace in bed. Diet Resume your normal diet.  Return to Work Will be discussed at you follow up appointment. Call Your Doctor If Any of These Occur Redness, drainage, or swelling at the wound.  Temperature greater than 101 degrees. Severe pain not relieved by pain medication. Incision starts to come apart. Follow Up Appt Call  [phone])  for problems.  If you have any hardware placed in your spine, you will need an x-ray before your appointment.  

## 2020-06-18 NOTE — Discharge Summary (Signed)
Physician Discharge Summary  Patient ID: [name] MRN: [id number] DOB/AGE: 1950-05-22 70 y.o.  Admit date: 06/16/2020 Discharge date: 06/18/2020  Admission Diagnoses: lumbar synovial cyst with radiculopathy    Discharge Diagnoses: same   Discharged Condition: good  Hospital Course: The patient was admitted on 06/16/2020 and taken to the operating room where the patient underwent PLIF L3-4 l4-5. The patient tolerated the procedure well and was taken to the recovery room and then to the floor in stable condition. The hospital course was routine. There were no complications. The wound remained clean dry and intact. Pt had appropriate back soreness. No complaints of leg pain or new N/T/W. The patient remained afebrile with stable vital signs, and tolerated a regular diet. The patient continued to increase activities, and pain was well controlled with oral pain medications.   Consults: None  Significant Diagnostic Studies:  Results for orders placed or performed during the hospital encounter of 06/13/20  SARS CORONAVIRUS 2 (TAT 6-24 HRS) Nasopharyngeal Nasopharyngeal Swab   Specimen: Nasopharyngeal Swab  Result Value Ref Range   SARS Coronavirus 2 NEGATIVE NEGATIVE  Surgical pcr screen   Specimen: Nasal Mucosa; Nasal Swab  Result Value Ref Range   MRSA, PCR NEGATIVE NEGATIVE   Staphylococcus aureus POSITIVE (A) NEGATIVE  Basic metabolic panel  Result Value Ref Range   Sodium 137 135 - 145 mmol/L   Potassium 4.6 3.5 - 5.1 mmol/L   Chloride 100 98 - 111 mmol/L   CO2 32 22 - 32 mmol/L   Glucose, Bld 97 70 - 99 mg/dL   BUN 12 8 - 23 mg/dL   Creatinine, Ser 0.79 0.44 - 1.00 mg/dL   Calcium 9.6 8.9 - 10.3 mg/dL   GFR, Estimated >60 >60 mL/min   Anion gap 5 5 - 15  CBC WITH DIFFERENTIAL  Result Value Ref Range   WBC 7.1 4.0 - 10.5 K/uL   RBC 4.23 3.87 - 5.11 MIL/uL   Hemoglobin 13.1 12.0 - 15.0 g/dL   HCT 41.7 36.0 - 46.0 %   MCV 98.6 80.0 - 100.0 fL   MCH 31.0 26.0 - 34.0 pg    MCHC 31.4 30.0 - 36.0 g/dL   RDW 12.6 11.5 - 15.5 %   Platelets 317 150 - 400 K/uL   nRBC 0.0 0.0 - 0.2 %   Neutrophils Relative % 54 %   Neutro Abs 3.8 1.7 - 7.7 K/uL   Lymphocytes Relative 37 %   Lymphs Abs 2.6 0.7 - 4.0 K/uL   Monocytes Relative 8 %   Monocytes Absolute 0.6 0.1 - 1.0 K/uL   Eosinophils Relative 0 %   Eosinophils Absolute 0.0 0.0 - 0.5 K/uL   Basophils Relative 1 %   Basophils Absolute 0.0 0.0 - 0.1 K/uL   Immature Granulocytes 0 %   Abs Immature Granulocytes 0.02 0.00 - 0.07 K/uL  Protime-INR  Result Value Ref Range   Prothrombin Time 12.5 11.4 - 15.2 seconds   INR 0.9 0.8 - 1.2  Type and screen [facility]  Result Value Ref Range   ABO/RH(D) A POS    Antibody Screen NEG    Sample Expiration 06/27/2020,2359    Extend sample reason      NO TRANSFUSIONS OR PREGNANCY IN THE PAST 3 MONTHS Performed at [facility], [address], [place], [place] [address]     Chest 2 View  Result Date: 06/13/2020 CLINICAL DATA:  Pre-surgical EXAM: CHEST - 2 VIEW COMPARISON:  November 04, 2008 FINDINGS: The cardiomediastinal silhouette is unchanged in contour. No pleural effusion. No pneumothorax. No acute pleuroparenchymal abnormality. Visualized abdomen is unremarkable. Status post ACDF. Degenerative changes of the LEFT acromioclavicular joint. Status post cholecystectomy. IMPRESSION: No acute cardiopulmonary abnormality. Electronically Signed   By: Valentino Saxon MD   On: 06/13/2020 08:58   DG Lumbar Spine 2-3 Views  Result Date: 06/16/2020 CLINICAL DATA:  L3-L5 PLIF. Previous L5-S1 hardware for spondylolisthesis. EXAM: LUMBAR SPINE - 2-3 VIEW; DG C-ARM 1-60 MIN FLUOROSCOPY TIME:  1 minute, 14 seconds (49.6 mGy) COMPARISON:  Lumbar spine radiographs-02/05/2014 FINDINGS: Two spot fluoroscopic images of the lower lumbar spine are provided for review. Provided images demonstrate interval L3-L5 posterior paraspinal fusion and intervertebral disc space  replacement. Stable sequela of previous L5-S1 intervertebral disc space replacement as well as bilateral pedicular fixation of S1 however the paraspinal rods no longer extend from L5-S1. Radiopaque operative material is seen posterior to the operative site. No radiopaque foreign body. IMPRESSION: Post L3-L5 paraspinal fusion intervertebral disc space replacement as detailed above. Electronically Signed   By: Sandi Mariscal M.D.   On: 06/16/2020 14:34   DG C-Arm 1-60 Min  Result Date: 06/16/2020 CLINICAL DATA:  L3-L5 PLIF. Previous L5-S1 hardware for spondylolisthesis. EXAM: LUMBAR SPINE - 2-3 VIEW; DG C-ARM 1-60 MIN FLUOROSCOPY TIME:  1 minute, 14 seconds (49.6 mGy) COMPARISON:  Lumbar spine radiographs-02/05/2014 FINDINGS: Two spot fluoroscopic images of the lower lumbar spine are provided for review. Provided images demonstrate interval L3-L5 posterior paraspinal fusion and intervertebral disc space replacement. Stable sequela of previous L5-S1 intervertebral disc space replacement as well as bilateral pedicular fixation of S1 however the paraspinal rods no longer extend from L5-S1. Radiopaque operative material is seen posterior to the operative site. No radiopaque foreign body. IMPRESSION: Post L3-L5 paraspinal fusion intervertebral disc space replacement as detailed above. Electronically Signed   By: Sandi Mariscal M.D.   On: 06/16/2020 14:34    Antibiotics:  Anti-infectives (From admission, onward)   Start     Dose/Rate Route Frequency Ordered Stop   06/16/20 1730  ceFAZolin (ANCEF) IVPB 2g/100 mL premix        2 g 200 mL/hr over 30 Minutes Intravenous Every 8 hours 06/16/20 1642 06/17/20 0940   06/16/20 1243  vancomycin (VANCOCIN) powder  Status:  Discontinued          As needed 06/16/20 1244 06/16/20 1319   06/16/20 0745  ceFAZolin (ANCEF) IVPB 2g/100 mL premix        2 g 200 mL/hr over 30 Minutes Intravenous On call to O.R. 06/16/20 0741 06/16/20 0904      Discharge Exam: Blood pressure (!)  119/56, pulse 81, temperature 98 F (36.7 C), temperature source Oral, resp. rate 18, height 5' 5.5" (1.664 m), weight 69.4 kg, SpO2 98 %. Neurologic: Grossly normal dressing dry  Discharge Medications:   Allergies as of 06/18/2020   No Known Allergies     Medication List    STOP taking these medications   methylPREDNISolone 4 MG Tbpk tablet Commonly known as: MEDROL DOSEPAK   predniSONE 20 MG tablet Commonly known as: DELTASONE     TAKE these medications   ascorbic acid 500 MG tablet Commonly known as: VITAMIN C Take 500 mg by mouth daily.   CALCIUM 600 + D PO Take 1 tablet by mouth daily.   docusate sodium 100 MG capsule Commonly known as: Colace Take 1 capsule (100 mg total) by mouth 2 (two) times daily as needed for mild  constipation. What changed: when to take this   DULoxetine 60 MG capsule Commonly known as: CYMBALTA Take 60 mg by mouth daily.   fexofenadine 180 MG tablet Commonly known as: ALLEGRA Take 180 mg by mouth daily.   gabapentin 300 MG capsule Commonly known as: NEURONTIN Take 1 capsule (300 mg total) by mouth 3 (three) times daily. Take 2 capsules (600mg ) at breakfast, one at lunch (300mg ), and 2 tablets (600mg ) at bedtime. What changed:   how much to take  when to take this   Ginkgo Biloba 60 MG Tabs Take 60 mg by mouth in the morning and at bedtime.   lidocaine 5 % Commonly known as: LIDODERM Place 1-3 patches onto the skin daily as needed (pain). Remove & Discard patch within 12 hours or as directed by MD   magnesium gluconate 500 MG tablet Commonly known as: MAGONATE Take 500 mg by mouth daily.   meloxicam 15 MG tablet Commonly known as: MOBIC Take 1 tablet (15 mg total) by mouth daily.   methocarbamol 500 MG tablet Commonly known as: ROBAXIN Take 500 mg by mouth every 8 (eight) hours as needed for muscle spasms.   MULTIVITAMIN PO Take 1 tablet by mouth daily.   oxyCODONE-acetaminophen 10-325 MG tablet Commonly known as:  PERCOCET Take 1 tablet by mouth every 4 (four) hours as needed for pain. What changed:   when to take this  reasons to take this   pantoprazole 40 MG tablet Commonly known as: PROTONIX Take 40 mg by mouth 2 (two) times daily.   polyethylene glycol 17 g packet Commonly known as: MIRALAX / GLYCOLAX Take 17 g by mouth daily.   potassium gluconate 595 (99 K) MG Tabs tablet Take 99 mg by mouth daily.   SLOW RELEASE IRON PO Take 1 tablet by mouth daily.   SYSTANE ULTRA OP Place 1 drop into both eyes 2 (two) times daily as needed (dry eyes).   TURMERIC CURCUMIN PO Take 1,000 mg by mouth daily.   vitamin B-12 1000 MCG tablet Commonly known as: CYANOCOBALAMIN Take 1,000 mcg by mouth in the morning and at bedtime.   Vitamin D 125 MCG (5000 UT) Caps Take 5,000 Units by mouth daily.   zinc gluconate 50 MG tablet Take 50 mg by mouth daily.            Durable Medical Equipment  (From admission, onward)         Start     Ordered   06/16/20 1643  DME Walker rolling  Once       Question:  Patient needs a walker to treat with the following condition  Answer:  S/P lumbar fusion   06/16/20 1642   06/16/20 1643  DME 3 n 1  Once        06/16/20 1642          Disposition: home   Final Dx: plif L3-4, L4-5  Discharge Instructions     Remove dressing in 72 hours   Complete by: As directed    Call MD for:  difficulty breathing, headache or visual disturbances   Complete by: As directed    Call MD for:  persistant nausea and vomiting   Complete by: As directed    Call MD for:  redness, tenderness, or signs of infection (pain, swelling, redness, odor or green/yellow discharge around incision site)   Complete by: As directed    Call MD for:  severe uncontrolled pain   Complete by: As directed  Call MD for:  temperature >100.4   Complete by: As directed    Diet - low sodium heart healthy   Complete by: As directed    Increase activity slowly   Complete by: As  directed        Follow-up Information    Eustace Moore, MD. Schedule an appointment as soon as possible for a visit in 2 week(s).   Specialty: Neurosurgery Contact information: [address] [place] [address] [place] [address] [phone]                Signed: Eustace Moore 06/18/2020, 8:19 AM

## 2020-06-18 NOTE — Progress Notes (Signed)
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 PMHx significant for anxiety/depression, fibromyalgia, and multiple previous spinal surgeries with most recent in 2016.    PT Comments    Pt progressing well with post-op mobility. She was able to demonstrate transfers and ambulation with gross modified independence and no AD. Pt was educated on precautions, brace application/wearing schedule, appropriate activity progression, and car transfer. Will continue to follow.      Follow Up Recommendations  No PT follow up;Supervision for mobility/OOB     Equipment Recommendations  None recommended by PT    Recommendations for Other Services       Precautions / Restrictions Precautions Precautions: Back Precaution Booklet Issued: Yes (comment) Precaution Comments: Patient able to recall 3/3 back precuations from previous surgeries. Good return demo during functional tasks. Required Braces or Orthoses: Spinal Brace Spinal Brace: Lumbar corset;Applied in sitting position Restrictions Weight Bearing Restrictions: No    Mobility  Bed Mobility Overal bed mobility: Modified Independent             General bed mobility comments: Pt was received sitting up in the recliner chair. Reviewed log roll technique.    Transfers Overall transfer level: Modified independent Equipment used: None Transfers: Sit to/from Stand Sit to Stand: Modified independent (Device/Increase time)         General transfer comment: No assist required with good posture and technique.  Ambulation/Gait Ambulation/Gait assistance: Modified independent (Device/Increase time) Gait Distance (Feet): 400 Feet Assistive device: None Gait Pattern/deviations: Step-through pattern;Decreased stride length Gait velocity:  Decreased Gait velocity interpretation: 1.31 - 2.62 ft/sec, indicative of limited community ambulator General Gait Details: Good posture overall. Occasional episodes of mild unsteadiness. Able to recover without difficulty but overall guarded.   Stairs             Wheelchair Mobility    Modified Rankin (Stroke Patients Only)       Balance Overall balance assessment: Mild deficits observed, not formally tested                                          Cognition Arousal/Alertness: Awake/alert Behavior During Therapy: WFL for tasks assessed/performed Overall Cognitive Status: Within Functional Limits for tasks assessed                                        Exercises      General Comments        Pertinent Vitals/Pain Pain Assessment: Faces Faces Pain Scale: Hurts little more Pain Location: Low back (incisional) Pain Descriptors / Indicators: Sore;Operative site guarding Pain Intervention(s): Limited activity within patient's tolerance;Monitored during session;Repositioned    Home Living                      Prior Function            PT Goals (current goals can now be found in the care plan section) Acute Rehab PT Goals Patient Stated Goal: To return home. PT Goal Formulation: With patient Time For Goal Achievement: 06/24/20 Potential to Achieve Goals: Good Progress towards PT goals: Progressing toward goals  Frequency    Min 5X/week      PT Plan Current plan remains appropriate    Co-evaluation              AM-PAC PT "6 Clicks" Mobility   Outcome Measure  Help needed turning from your back to your side while in a flat bed without using bedrails?: None Help needed moving from lying on your back to sitting on the side of a flat bed without using bedrails?: None Help needed moving to and from a bed to a chair (including a wheelchair)?: None Help needed standing up from a chair using your arms  (e.g., wheelchair or bedside chair)?: None Help needed to walk in hospital room?: None Help needed climbing 3-5 steps with a railing? : A Little 6 Click Score: 23    End of Session Equipment Utilized During Treatment: Gait belt;Back brace Activity Tolerance: Patient tolerated treatment well Patient left: in chair;with call bell/phone within reach Nurse Communication: Mobility status PT Visit Diagnosis: Unsteadiness on feet (R26.81);Pain Pain - part of body:  (back/incision site)     Time: 8469-6295 PT Time Calculation (min) (ACUTE ONLY): 11 min  Charges:  $Gait Training: 8-22 mins                     Rolinda Roan, PT, DPT Acute Rehabilitation Services Pager: [phone] Office: [phone]    Thelma Comp 06/18/2020, 8:37 AM

## 2021-10-09 ENCOUNTER — Other Ambulatory Visit: Payer: Self-pay | Admitting: Neurological Surgery

## 2021-10-13 NOTE — Pre-Procedure Instructions (Addendum)
Surgical Instructions    Your procedure is scheduled on October 16, 2021.  Report to [facility] Main Entrance "A" at 8:30 A.M., then check in with the Admitting office.  Call this number if you have problems the morning of surgery:  [phone]   If you have any questions prior to your surgery date call [phone]: Open Monday-Friday 8am-4pm    Remember:  Do not eat after midnight the night before your surgery  You may drink clear liquids until 7:30 AM the morning of your surgery.   Clear liquids allowed are: Water, Non-Citrus Juices (without pulp), Carbonated Beverages, Clear Tea, Black Coffee Only (NO MILK, CREAM OR POWDERED CREAMER of any kind), and Gatorade.     Take these medicines the morning of fexofenadine (ALLEGRA) surgery with A SIP OF WATER:  docusate sodium (COLACE)   pantoprazole (PROTONIX)   Take these medicines the morning of surgery AS NEEDED:  acyclovir (ZOVIRAX)  cyclobenzaprine (FLEXERIL)  ondansetron (ZOFRAN)  oxyCODONE-acetaminophen (PERCOCET)  Polyethyl Glycol-Propyl Glycol (SYSTANE ULTRA OP)   As of today, STOP taking any Aspirin (unless otherwise instructed by your surgeon) Aleve, Naproxen, Ibuprofen, Motrin, Advil, Goody's, BC's, all herbal medications, fish oil, and all vitamins.                     Do NOT Smoke (Tobacco/Vaping) for 24 hours prior to your procedure.  If you use a CPAP at night, you may bring your mask/headgear for your overnight stay.   Contacts, glasses, piercing's, hearing aid's, dentures or partials may not be worn into surgery, please bring cases for these belongings.    For patients admitted to the hospital, discharge time will be determined by your treatment team.   Patients discharged the day of surgery will not be allowed to drive home, and someone needs to stay with them for 24 hours.  SURGICAL WAITING ROOM VISITATION Patients having surgery or a procedure may have no more than 2 support people in the waiting area -  these visitors may rotate.   Children under the age of 63 must have an adult with them who is not the patient. If the patient needs to stay at the hospital during part of their recovery, the visitor guidelines for inpatient rooms apply. Pre-op nurse will coordinate an appropriate time for 1 support person to accompany patient in pre-op.  This support person may not rotate.   Please refer to the [facility] website for the visitor guidelines for Inpatients (after your surgery is over and you are in a regular room).    Special instructions:   - Preparing For Surgery  Before surgery, you can play an important role. Because skin is not sterile, your skin needs to be as free of germs as possible. You can reduce the number of germs on your skin by washing with CHG (chlorahexidine gluconate) Soap before surgery.  CHG is an antiseptic cleaner which kills germs and bonds with the skin to continue killing germs even after washing.    Oral Hygiene is also important to reduce your risk of infection.  Remember - BRUSH YOUR TEETH THE MORNING OF SURGERY WITH YOUR REGULAR TOOTHPASTE  Please do not use if you have an allergy to CHG or antibacterial soaps. If your skin becomes reddened/irritated stop using the CHG.  Do not shave (including legs and underarms) for at least 48 hours prior to first CHG shower. It is OK to shave your face.  Please follow these instructions carefully.   Shower  the NIGHT BEFORE SURGERY and the MORNING OF SURGERY  If you chose to wash your hair, wash your hair first as usual with your normal shampoo.  After you shampoo, rinse your hair and body thoroughly to remove the shampoo.  Use CHG Soap as you would any other liquid soap. You can apply CHG directly to the skin and wash gently with a scrungie or a clean washcloth.   Apply the CHG Soap to your body ONLY FROM THE NECK DOWN.  Do not use on open wounds or open sores. Avoid contact with your eyes, ears, mouth and  genitals (private parts). Wash Face and genitals (private parts)  with your normal soap.   Wash thoroughly, paying special attention to the area where your surgery will be performed.  Thoroughly rinse your body with warm water from the neck down.  DO NOT shower/wash with your normal soap after using and rinsing off the CHG Soap.  Pat yourself dry with a CLEAN TOWEL.  Wear CLEAN PAJAMAS to bed the night before surgery  Place CLEAN SHEETS on your bed the night before your surgery  DO NOT SLEEP WITH PETS.   Day of Surgery: Take a shower with CHG soap. Do not wear jewelry or makeup Do not wear lotions, powders, perfumes/colognes, or deodorant. Do not shave 48 hours prior to surgery.  Men may shave face and neck. Do not bring valuables to the hospital.  [facility] is not responsible for any belongings or valuables. Do not wear nail polish, gel polish, artificial nails, or any other type of covering on natural nails (fingers and toes) If you have artificial nails or gel coating that need to be removed by a nail salon, please have this removed prior to surgery. Artificial nails or gel coating may interfere with anesthesia's ability to adequately monitor your vital signs.  Wear Clean/Comfortable clothing the morning of surgery Remember to brush your teeth WITH YOUR REGULAR TOOTHPASTE.   Please read over the following fact sheets that you were given.    If you received a COVID test during your pre-op visit  it is requested that you wear a mask when out in public, stay away from anyone that may not be feeling well and notify your surgeon if you develop symptoms. If you have been in contact with anyone that has tested positive in the last 10 days please notify you surgeon.

## 2021-10-14 ENCOUNTER — Encounter (HOSPITAL_COMMUNITY)
Admission: RE | Admit: 2021-10-14 | Discharge: 2021-10-14 | Disposition: A | Discharge status: Home / Self Care | Payer: Medicare Other | Source: Ambulatory Visit | Attending: Neurological Surgery | Admitting: Neurological Surgery

## 2021-10-14 ENCOUNTER — Encounter (HOSPITAL_COMMUNITY): Payer: Self-pay

## 2021-10-14 ENCOUNTER — Other Ambulatory Visit (HOSPITAL_COMMUNITY): Payer: Self-pay

## 2021-10-14 ENCOUNTER — Other Ambulatory Visit: Payer: Self-pay

## 2021-10-14 VITALS — BP 144/79 | HR 87 | Temp 98.3°F | Resp 17 | Ht 65.0 in | Wt 161.1 lb

## 2021-10-14 DIAGNOSIS — R1310 Dysphagia, unspecified: Secondary | ICD-10-CM

## 2021-10-14 DIAGNOSIS — Z79899 Other long term (current) drug therapy: Secondary | ICD-10-CM | POA: Diagnosis not present

## 2021-10-14 DIAGNOSIS — Z01818 Encounter for other preprocedural examination: Secondary | ICD-10-CM

## 2021-10-14 DIAGNOSIS — Z01812 Encounter for preprocedural laboratory examination: Secondary | ICD-10-CM | POA: Diagnosis present

## 2021-10-14 LAB — CBC
HCT: 37.9 % (ref 36.0–46.0)
Hemoglobin: 12.6 g/dL (ref 12.0–15.0)
MCH: 32 pg (ref 26.0–34.0)
MCHC: 33.2 g/dL (ref 30.0–36.0)
MCV: 96.2 fL (ref 80.0–100.0)
Platelets: 281 10*3/uL (ref 150–400)
RBC: 3.94 MIL/uL (ref 3.87–5.11)
RDW: 12.1 % (ref 11.5–15.5)
WBC: 6.2 10*3/uL (ref 4.0–10.5)
nRBC: 0 % (ref 0.0–0.2)

## 2021-10-14 LAB — SURGICAL PCR SCREEN
MRSA, PCR: NEGATIVE
Staphylococcus aureus: NEGATIVE

## 2021-10-14 NOTE — Progress Notes (Signed)
PCP - PA Kevan Ny- [facility]  Cardiologist - Denies  EP-  Denies  Endocrine-  Denies  Pulm-  Denies  Chest x-ray -  Denies  EKG -  Denies  Stress Test -  Denies  ECHO -  Denies  Cardiac Cath -  Denies  AICD-na PM-na LOOP-na  Nerve Stimulator-  Denies  Dialysis-  Denies  Sleep Study -  Denies CPAP -  Denies  LABS- 10/14/21: CBC, PCR  ASA-  Denies  ERAS- Yes- clears until 0730  HA1C-  Denies  Anesthesia- No  Pt denies having chest pain, sob, or fever at this time. All instructions explained to the pt, with a verbal understanding of the material. Pt agrees to go over the instructions while at home for a better understanding. Pt also instructed to wear a mask and social distance if she goes out. The opportunity to ask questions was provided.

## 2021-10-16 ENCOUNTER — Observation Stay (HOSPITAL_COMMUNITY)
Admission: RE | Admit: 2021-10-16 | Discharge: 2021-10-17 | Disposition: A | Discharge status: Home / Self Care | Payer: Medicare Other | Attending: Neurological Surgery | Admitting: Neurological Surgery

## 2021-10-16 ENCOUNTER — Encounter (HOSPITAL_COMMUNITY): Payer: Self-pay | Admitting: Neurological Surgery

## 2021-10-16 ENCOUNTER — Ambulatory Visit (HOSPITAL_COMMUNITY): Payer: Medicare Other

## 2021-10-16 ENCOUNTER — Other Ambulatory Visit: Payer: Self-pay

## 2021-10-16 ENCOUNTER — Ambulatory Visit (HOSPITAL_BASED_OUTPATIENT_CLINIC_OR_DEPARTMENT_OTHER): Payer: Medicare Other | Admitting: Vascular Surgery

## 2021-10-16 ENCOUNTER — Encounter (HOSPITAL_COMMUNITY)
Admission: RE | Disposition: A | Discharge status: Home / Self Care | Payer: Self-pay | Source: Home / Self Care | Attending: Neurological Surgery

## 2021-10-16 ENCOUNTER — Ambulatory Visit (HOSPITAL_COMMUNITY): Payer: Medicare Other | Admitting: Vascular Surgery

## 2021-10-16 DIAGNOSIS — Z981 Arthrodesis status: Secondary | ICD-10-CM

## 2021-10-16 DIAGNOSIS — Z79899 Other long term (current) drug therapy: Secondary | ICD-10-CM | POA: Diagnosis not present

## 2021-10-16 DIAGNOSIS — M47812 Spondylosis without myelopathy or radiculopathy, cervical region: Secondary | ICD-10-CM

## 2021-10-16 DIAGNOSIS — Z87891 Personal history of nicotine dependence: Secondary | ICD-10-CM | POA: Diagnosis not present

## 2021-10-16 DIAGNOSIS — M4802 Spinal stenosis, cervical region: Secondary | ICD-10-CM

## 2021-10-16 DIAGNOSIS — M47892 Other spondylosis, cervical region: Principal | ICD-10-CM | POA: Insufficient documentation

## 2021-10-16 DIAGNOSIS — M542 Cervicalgia: Secondary | ICD-10-CM | POA: Insufficient documentation

## 2021-10-16 DIAGNOSIS — M79603 Pain in arm, unspecified: Secondary | ICD-10-CM | POA: Insufficient documentation

## 2021-10-16 HISTORY — PX: ANTERIOR CERVICAL DECOMP/DISCECTOMY FUSION: SHX1161

## 2021-10-16 SURGERY — ANTERIOR CERVICAL DECOMPRESSION/DISCECTOMY FUSION 1 LEVEL
Anesthesia: General

## 2021-10-16 MED ORDER — CEFAZOLIN SODIUM-DEXTROSE 2-4 GM/100ML-% IV SOLN
2.0000 g | Freq: Three times a day (TID) | INTRAVENOUS | Status: AC
  Administered 2021-10-16 – 2021-10-17 (×2): 2 g via INTRAVENOUS
  Filled 2021-10-16 (×2): qty 100

## 2021-10-16 MED ORDER — BUPIVACAINE HCL (PF) 0.25 % IJ SOLN
INTRAMUSCULAR | Status: DC | PRN
  Administered 2021-10-16: 4 mL

## 2021-10-16 MED ORDER — CHLORHEXIDINE GLUCONATE 0.12 % MT SOLN: 15.0000 mL | Freq: Once | OROMUCOSAL | Status: AC

## 2021-10-16 MED ORDER — CEFAZOLIN SODIUM-DEXTROSE 2-3 GM-%(50ML) IV SOLR
INTRAVENOUS | Status: DC | PRN
  Administered 2021-10-16: 2 g via INTRAVENOUS

## 2021-10-16 MED ORDER — LIDOCAINE 2% (20 MG/ML) 5 ML SYRINGE
INTRAMUSCULAR | Status: DC | PRN
  Administered 2021-10-16: 60 mg via INTRAVENOUS

## 2021-10-16 MED ORDER — DEXMEDETOMIDINE HCL IN NACL 80 MCG/20ML IV SOLN
INTRAVENOUS | Status: DC | PRN
  Administered 2021-10-16 (×4): 4 ug via BUCCAL

## 2021-10-16 MED ORDER — ACETAMINOPHEN 650 MG RE SUPP: 650.0000 mg | RECTAL | Status: DC | PRN

## 2021-10-16 MED ORDER — SENNA 8.6 MG PO TABS
1.0000 | ORAL_TABLET | Freq: Two times a day (BID) | ORAL | Status: DC
  Administered 2021-10-16: 8.6 mg via ORAL
  Filled 2021-10-16: qty 1

## 2021-10-16 MED ORDER — MIDAZOLAM HCL 2 MG/2ML IJ SOLN
INTRAMUSCULAR | Status: DC | PRN
  Administered 2021-10-16: 2 mg via INTRAVENOUS

## 2021-10-16 MED ORDER — FENTANYL CITRATE (PF) 100 MCG/2ML IJ SOLN
INTRAMUSCULAR | Status: AC
  Filled 2021-10-16: qty 2

## 2021-10-16 MED ORDER — OXYCODONE-ACETAMINOPHEN 5-325 MG PO TABS
1.0000 | ORAL_TABLET | ORAL | Status: DC | PRN
  Administered 2021-10-16 – 2021-10-17 (×4): 1 via ORAL
  Filled 2021-10-16 (×4): qty 1

## 2021-10-16 MED ORDER — ONDANSETRON HCL 4 MG PO TABS: 4.0000 mg | ORAL_TABLET | Freq: Four times a day (QID) | ORAL | Status: DC | PRN

## 2021-10-16 MED ORDER — OXYCODONE HCL 5 MG PO TABS
5.0000 mg | ORAL_TABLET | ORAL | Status: DC | PRN
  Administered 2021-10-16 – 2021-10-17 (×4): 5 mg via ORAL
  Filled 2021-10-16 (×4): qty 1

## 2021-10-16 MED ORDER — SCOPOLAMINE 1 MG/3DAYS TD PT72
1.0000 | MEDICATED_PATCH | TRANSDERMAL | Status: DC
  Administered 2021-10-16: 1.5 mg via TRANSDERMAL
  Filled 2021-10-16: qty 1

## 2021-10-16 MED ORDER — ONDANSETRON HCL 4 MG/2ML IJ SOLN
INTRAMUSCULAR | Status: DC | PRN
  Administered 2021-10-16: 4 mg via INTRAVENOUS

## 2021-10-16 MED ORDER — POTASSIUM CHLORIDE IN NACL 20-0.9 MEQ/L-% IV SOLN
INTRAVENOUS | Status: DC
  Filled 2021-10-16: qty 1000

## 2021-10-16 MED ORDER — PROPOFOL 10 MG/ML IV BOLUS
INTRAVENOUS | Status: DC | PRN
  Administered 2021-10-16: 150 mg via INTRAVENOUS

## 2021-10-16 MED ORDER — THROMBIN 5000 UNITS EX SOLR
CUTANEOUS | Status: AC
  Filled 2021-10-16: qty 5000

## 2021-10-16 MED ORDER — BUPIVACAINE HCL (PF) 0.25 % IJ SOLN
INTRAMUSCULAR | Status: AC
  Filled 2021-10-16: qty 30

## 2021-10-16 MED ORDER — SODIUM CHLORIDE 0.9% FLUSH
3.0000 mL | Freq: Two times a day (BID) | INTRAVENOUS | Status: DC
  Administered 2021-10-16 – 2021-10-17 (×2): 3 mL via INTRAVENOUS

## 2021-10-16 MED ORDER — OXYCODONE-ACETAMINOPHEN 10-325 MG PO TABS: 1.0000 | ORAL_TABLET | ORAL | Status: DC | PRN

## 2021-10-16 MED ORDER — THROMBIN 5000 UNITS EX SOLR
OROMUCOSAL | Status: DC | PRN
  Administered 2021-10-16: 10 mL via TOPICAL

## 2021-10-16 MED ORDER — PHENOL 1.4 % MT LIQD: 1.0000 | OROMUCOSAL | Status: DC | PRN

## 2021-10-16 MED ORDER — ONDANSETRON HCL 4 MG/2ML IJ SOLN: 4.0000 mg | Freq: Four times a day (QID) | INTRAMUSCULAR | Status: DC | PRN

## 2021-10-16 MED ORDER — MIDAZOLAM HCL 2 MG/2ML IJ SOLN
INTRAMUSCULAR | Status: AC
  Filled 2021-10-16: qty 2

## 2021-10-16 MED ORDER — MENTHOL 3 MG MT LOZG: 1.0000 | LOZENGE | OROMUCOSAL | Status: DC | PRN

## 2021-10-16 MED ORDER — DEXAMETHASONE SODIUM PHOSPHATE 10 MG/ML IJ SOLN
INTRAMUSCULAR | Status: AC
  Filled 2021-10-16: qty 1

## 2021-10-16 MED ORDER — OXYCODONE HCL 5 MG PO TABS: 5.0000 mg | ORAL_TABLET | Freq: Once | ORAL | Status: DC | PRN

## 2021-10-16 MED ORDER — GLYCOPYRROLATE 1 MG PO TABS
2.0000 mg | ORAL_TABLET | Freq: Two times a day (BID) | ORAL | Status: DC
  Administered 2021-10-16: 2 mg via ORAL
  Filled 2021-10-16 (×3): qty 2

## 2021-10-16 MED ORDER — SODIUM CHLORIDE 0.9 % IV SOLN: 250.0000 mL | INTRAVENOUS | Status: DC

## 2021-10-16 MED ORDER — LACTATED RINGERS IV SOLN: INTRAVENOUS | Status: DC

## 2021-10-16 MED ORDER — PHENYLEPHRINE HCL-NACL 20-0.9 MG/250ML-% IV SOLN
INTRAVENOUS | Status: DC | PRN
  Administered 2021-10-16: 30 ug/min via INTRAVENOUS

## 2021-10-16 MED ORDER — ACETAMINOPHEN 325 MG PO TABS: 650.0000 mg | ORAL_TABLET | ORAL | Status: DC | PRN

## 2021-10-16 MED ORDER — MAGNESIUM GLUCONATE 500 MG PO TABS: 500.0000 mg | ORAL_TABLET | Freq: Every day | ORAL | Status: DC

## 2021-10-16 MED ORDER — FENTANYL CITRATE (PF) 250 MCG/5ML IJ SOLN
INTRAMUSCULAR | Status: AC
  Filled 2021-10-16: qty 5

## 2021-10-16 MED ORDER — CHLORHEXIDINE GLUCONATE 0.12 % MT SOLN
OROMUCOSAL | Status: AC
  Administered 2021-10-16: 15 mL via OROMUCOSAL
  Filled 2021-10-16: qty 15

## 2021-10-16 MED ORDER — ONDANSETRON HCL 4 MG/2ML IJ SOLN
INTRAMUSCULAR | Status: AC
  Filled 2021-10-16: qty 2

## 2021-10-16 MED ORDER — DEXAMETHASONE SODIUM PHOSPHATE 10 MG/ML IJ SOLN
INTRAMUSCULAR | Status: DC | PRN
  Administered 2021-10-16: 10 mg via INTRAVENOUS

## 2021-10-16 MED ORDER — POTASSIUM 99 MG PO TABS: 99.0000 mg | ORAL_TABLET | Freq: Every day | ORAL | Status: DC

## 2021-10-16 MED ORDER — ONDANSETRON HCL 4 MG/2ML IJ SOLN: 4.0000 mg | Freq: Once | INTRAMUSCULAR | Status: DC | PRN

## 2021-10-16 MED ORDER — LACTATED RINGERS IV SOLN: INTRAVENOUS | Status: DC | PRN

## 2021-10-16 MED ORDER — DEXAMETHASONE 4 MG PO TABS
4.0000 mg | ORAL_TABLET | Freq: Four times a day (QID) | ORAL | Status: DC
  Administered 2021-10-17: 4 mg via ORAL
  Filled 2021-10-16: qty 1

## 2021-10-16 MED ORDER — ORAL CARE MOUTH RINSE: 15.0000 mL | Freq: Once | OROMUCOSAL | Status: AC

## 2021-10-16 MED ORDER — PROPOFOL 10 MG/ML IV BOLUS
INTRAVENOUS | Status: AC
  Filled 2021-10-16: qty 20

## 2021-10-16 MED ORDER — ROCURONIUM BROMIDE 10 MG/ML (PF) SYRINGE
PREFILLED_SYRINGE | INTRAVENOUS | Status: DC | PRN
  Administered 2021-10-16: 50 mg via INTRAVENOUS
  Administered 2021-10-16: 20 mg via INTRAVENOUS

## 2021-10-16 MED ORDER — FENTANYL CITRATE (PF) 250 MCG/5ML IJ SOLN
INTRAMUSCULAR | Status: DC | PRN
  Administered 2021-10-16 (×3): 50 ug via INTRAVENOUS
  Administered 2021-10-16: 100 ug via INTRAVENOUS

## 2021-10-16 MED ORDER — SODIUM CHLORIDE 0.9% FLUSH: 3.0000 mL | INTRAVENOUS | Status: DC | PRN

## 2021-10-16 MED ORDER — FENTANYL CITRATE (PF) 100 MCG/2ML IJ SOLN
25.0000 ug | INTRAMUSCULAR | Status: DC | PRN
  Administered 2021-10-16 (×4): 25 ug via INTRAVENOUS

## 2021-10-16 MED ORDER — PANTOPRAZOLE SODIUM 40 MG PO TBEC
40.0000 mg | DELAYED_RELEASE_TABLET | Freq: Two times a day (BID) | ORAL | Status: DC
  Administered 2021-10-16: 40 mg via ORAL
  Filled 2021-10-16: qty 1

## 2021-10-16 MED ORDER — DEXAMETHASONE SODIUM PHOSPHATE 4 MG/ML IJ SOLN
4.0000 mg | Freq: Four times a day (QID) | INTRAMUSCULAR | Status: DC
  Administered 2021-10-16 – 2021-10-17 (×2): 4 mg via INTRAVENOUS
  Filled 2021-10-16 (×2): qty 1

## 2021-10-16 MED ORDER — OXYCODONE HCL 5 MG/5ML PO SOLN: 5.0000 mg | Freq: Once | ORAL | Status: DC | PRN

## 2021-10-16 MED ORDER — SUGAMMADEX SODIUM 200 MG/2ML IV SOLN
INTRAVENOUS | Status: DC | PRN
  Administered 2021-10-16: 280 mg via INTRAVENOUS

## 2021-10-16 MED ORDER — 0.9 % SODIUM CHLORIDE (POUR BTL) OPTIME
TOPICAL | Status: DC | PRN
  Administered 2021-10-16: 1000 mL

## 2021-10-16 MED ORDER — ACETAMINOPHEN 500 MG PO TABS
ORAL_TABLET | ORAL | Status: AC
  Administered 2021-10-16: 1000 mg via ORAL
  Filled 2021-10-16: qty 2

## 2021-10-16 MED ORDER — CYCLOBENZAPRINE HCL 10 MG PO TABS
10.0000 mg | ORAL_TABLET | Freq: Three times a day (TID) | ORAL | Status: DC | PRN
  Administered 2021-10-16 – 2021-10-17 (×2): 10 mg via ORAL
  Filled 2021-10-16 (×2): qty 1

## 2021-10-16 MED ORDER — ACETAMINOPHEN 500 MG PO TABS: 1000.0000 mg | ORAL_TABLET | Freq: Once | ORAL | Status: AC

## 2021-10-16 MED ORDER — MORPHINE SULFATE (PF) 2 MG/ML IV SOLN
2.0000 mg | INTRAVENOUS | Status: DC | PRN
  Administered 2021-10-16 (×2): 2 mg via INTRAVENOUS
  Filled 2021-10-16 (×2): qty 1

## 2021-10-16 SURGICAL SUPPLY — 52 items
APL SKNCLS STERI-STRIP NONHPOA (GAUZE/BANDAGES/DRESSINGS) ×1
BAG COUNTER SPONGE SURGICOUNT (BAG) ×2 IMPLANT
BAG SPNG CNTER NS LX DISP (BAG) ×1
BAND INSRT 18 STRL LF DISP RB (MISCELLANEOUS) ×2
BAND RUBBER #18 3X1/16 STRL (MISCELLANEOUS) ×4 IMPLANT
BASKET BONE COLLECTION (BASKET) IMPLANT
BENZOIN TINCTURE PRP APPL 2/3 (GAUZE/BANDAGES/DRESSINGS) ×2 IMPLANT
BIT DRILL 2.3X12 (BIT) IMPLANT
BUR CARBIDE MATCH 3.0 (BURR) ×2 IMPLANT
CANISTER SUCT 3000ML PPV (MISCELLANEOUS) ×2 IMPLANT
DRAPE C-ARM 42X72 X-RAY (DRAPES) ×4 IMPLANT
DRAPE LAPAROTOMY 100X72 PEDS (DRAPES) ×2 IMPLANT
DRAPE MICROSCOPE SLANT 54X150 (MISCELLANEOUS) ×2 IMPLANT
DRSG OPSITE POSTOP 3X4 (GAUZE/BANDAGES/DRESSINGS) IMPLANT
DURAPREP 6ML APPLICATOR 50/CS (WOUND CARE) ×2 IMPLANT
ELECT COATED BLADE 2.86 ST (ELECTRODE) ×2 IMPLANT
ELECT REM PT RETURN 9FT ADLT (ELECTROSURGICAL) ×1
ELECTRODE REM PT RTRN 9FT ADLT (ELECTROSURGICAL) ×2 IMPLANT
GAUZE 4X4 16PLY ~~LOC~~+RFID DBL (SPONGE) IMPLANT
GLOVE BIO SURGEON STRL SZ7 (GLOVE) IMPLANT
GLOVE BIO SURGEON STRL SZ8 (GLOVE) ×2 IMPLANT
GLOVE BIOGEL PI IND STRL 7.0 (GLOVE) IMPLANT
GLOVE BIOGEL PI IND STRL 7.5 (GLOVE) IMPLANT
GLOVE SS BIOGEL STRL SZ 7 (GLOVE) IMPLANT
GOWN STRL REUS W/ TWL LRG LVL3 (GOWN DISPOSABLE) IMPLANT
GOWN STRL REUS W/ TWL XL LVL3 (GOWN DISPOSABLE) IMPLANT
GOWN STRL REUS W/TWL 2XL LVL3 (GOWN DISPOSABLE) ×2 IMPLANT
GOWN STRL REUS W/TWL LRG LVL3 (GOWN DISPOSABLE)
GOWN STRL REUS W/TWL XL LVL3 (GOWN DISPOSABLE)
HEMOSTAT POWDER KIT SURGIFOAM (HEMOSTASIS) ×2 IMPLANT
KIT BASIN OR (CUSTOM PROCEDURE TRAY) ×2 IMPLANT
KIT TURNOVER KIT B (KITS) ×2 IMPLANT
NDL HYPO 25X1 1.5 SAFETY (NEEDLE) ×2 IMPLANT
NDL SPNL 20GX3.5 QUINCKE YW (NEEDLE) ×2 IMPLANT
NEEDLE HYPO 25X1 1.5 SAFETY (NEEDLE) ×1 IMPLANT
NEEDLE SPNL 20GX3.5 QUINCKE YW (NEEDLE) ×1 IMPLANT
NS IRRIG 1000ML POUR BTL (IV SOLUTION) ×2 IMPLANT
PACK LAMINECTOMY NEURO (CUSTOM PROCEDURE TRAY) ×2 IMPLANT
PAD ARMBOARD 7.5X6 YLW CONV (MISCELLANEOUS) ×2 IMPLANT
PIN DISTRACTION 14MM (PIN) IMPLANT
PLATE LOCK INSIGNIA 24 1L (Plate) IMPLANT
SCREW VA SINGLE LEAD 4X14 (Screw) ×4 IMPLANT
SCREW VA SINGLE LEAD 4X14 ST (Screw) IMPLANT
SPACER ASSEM CERV LORD 8M (Spacer) IMPLANT
SPONGE INTESTINAL PEANUT (DISPOSABLE) ×2 IMPLANT
SPONGE SURGIFOAM ABS GEL SZ50 (HEMOSTASIS) IMPLANT
STRIP CLOSURE SKIN 1/2X4 (GAUZE/BANDAGES/DRESSINGS) ×2 IMPLANT
SUT VIC AB 3-0 SH 8-18 (SUTURE) ×2 IMPLANT
SUT VICRYL 4-0 PS2 18IN ABS (SUTURE) IMPLANT
TOWEL GREEN STERILE (TOWEL DISPOSABLE) ×2 IMPLANT
TOWEL GREEN STERILE FF (TOWEL DISPOSABLE) ×2 IMPLANT
WATER STERILE IRR 1000ML POUR (IV SOLUTION) ×2 IMPLANT

## 2021-10-16 NOTE — Progress Notes (Signed)
Patient looks good, sitting up in bed eating. Expected posterior neck pain. Will discharge in the AM.

## 2021-10-16 NOTE — Transfer of Care (Signed)
Immediate Anesthesia Transfer of Care Note  Patient: [name]  Procedure(s) Performed: Anterior Cervical Discectomy and Fusion Cervical Three-Four  Patient Location: PACU  Anesthesia Type:General  Level of Consciousness: awake, alert  and oriented  Airway & Oxygen Therapy: Patient Spontanous Breathing and Patient connected to nasal cannula oxygen  Post-op Assessment: Report given to RN, Post -op Vital signs reviewed and stable and Patient moving all extremities X 4  Post vital signs: Reviewed and stable  Last Vitals:  Vitals Value Taken Time  BP 139/70 10/16/21 1318  Temp 36.3 C 10/16/21 1318  Pulse 72 10/16/21 1321  Resp 11 10/16/21 1321  SpO2 99 % 10/16/21 1321  Vitals shown include unvalidated device data.  Last Pain:  Vitals:   10/16/21 0841  TempSrc:   PainSc: 2          Complications: No notable events documented.

## 2021-10-16 NOTE — Anesthesia Procedure Notes (Signed)
Procedure Name: Intubation Date/Time: 10/16/2021 11:23 AM  Performed by: Leonor Liv, CRNAPre-anesthesia Checklist: Patient identified, Emergency Drugs available, Suction available, Patient being monitored and Timeout performed Patient Re-evaluated:Patient Re-evaluated prior to induction Oxygen Delivery Method: Circle system utilized Preoxygenation: Pre-oxygenation with 100% oxygen Induction Type: IV induction Ventilation: Mask ventilation without difficulty Laryngoscope Size: Glidescope and 3 Grade View: Grade I Tube type: Oral Tube size: 7.0 mm Number of attempts: 1 Airway Equipment and Method: Video-laryngoscopy and Rigid stylet Placement Confirmation: ETT inserted through vocal cords under direct vision, breath sounds checked- equal and bilateral and CO2 detector Secured at: 20 cm Tube secured with: Tape Dental Injury: Teeth and Oropharynx as per pre-operative assessment

## 2021-10-16 NOTE — Op Note (Signed)
10/16/2021  1:04 PM  PATIENT:  Tamie Minteer Martenson  71 y.o. 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 Decompressive anterior cervical discectomy C3-4, 2. Anterior cervical arthrodesis C3-4 utilizing a cortical cancellous allograft bone graft, 3. Anterior cervical plating C3-4 utilizing a 24 mm Alphatec plate  SURGEON:  Sherley Bounds, MD  ASSISTANTS: Glenford Peers FNP  ANESTHESIA:   General  EBL: 50 ml  Total I/O In: 600 [I.V.:600] Out: 50 [Blood:50]  BLOOD ADMINISTERED: none  DRAINS: none  SPECIMEN:  none  INDICATION FOR PROCEDURE: This patient presented with neck and arm pain. Imaging showed adjacent level spondylosis with stenosis at C3-4. The patient tried conservative measures without relief. Pain was debilitating. Recommended ACDF with plating. Patient understood the risks, benefits, and alternatives and potential outcomes and wished to proceed.  PROCEDURE DETAILS: Patient was brought to the operating room placed under general endotracheal anesthesia. Patient was placed in the supine position on the operating room table. The neck was prepped with Duraprep and draped in a sterile fashion.   Three cc of local anesthesia was injected and a transverse incision was made on the right side of the neck.  Dissection was carried down thru the subcutaneous tissue and the platysma was  elevated, opened, and undermined with Metzenbaum scissors.  Dissection was then carried out thru an avascular plane leaving the sternocleidomastoid carotid artery and jugular vein laterally and the trachea and esophagus medially with the assistance of my nurse practitioner. The ventral aspect of the vertebral column was identified and a localizing x-ray was taken. The C3-4 level was identified and all in the room agreed with the level. The longus colli muscles were then elevated and the retractor was placed with  the assistance of my nurse practitioner. The annulus was incised and the disc space entered. Discectomy was performed with micro-curettes and pituitary rongeurs. I then used the high-speed drill to drill the endplates down to the level of the posterior longitudinal ligament. The operating microscope was draped and brought into the field provided additional magnification, illumination and visualization. Discectomy was continued posteriorly thru the disc space. Posterior longitudinal ligament was opened with a nerve hook, and then removed along with disc herniation and osteophytes, decompressing the spinal canal and thecal sac. We then continued to remove osteophytic overgrowth and disc material decompressing the neural foramina and exiting nerve roots bilaterally. The scope was angled up and down to help decompress and undercut the vertebral bodies. Once the decompression was completed we could pass a nerve hook circumferentially to assure adequate decompression in the midline and in the neural foramina. So by both visualization and palpation we felt we had an adequate decompression of the neural elements. We then measured the height of the intravertebral disc space and selected a 8 millimeter cortical cancellous allograft bone graft.  It was then gently positioned in the intravertebral disc space(s) and countersunk. I then used a 24 mm Alphatec plate and placed 14 mm variable angle screws into the vertebral bodies of each level and locked them into position. The wound was irrigated with bacitracin solution, checked for hemostasis which was established and confirmed. Once meticulous hemostasis was achieved, we then proceeded with closure with the assistance of my nurse practitioner. The platysma was closed with interrupted 3-0 undyed Vicryl suture, the subcuticular layer was closed with interrupted 3-0 undyed Vicryl suture. The skin edges were approximated with steristrips. The drapes were removed.  A sterile dressing  was applied. The patient was then awakened from general anesthesia and transferred to the recovery room in stable condition. At the end of the procedure all sponge, needle and instrument counts were correct.   PLAN OF CARE: Admit for overnight observation  PATIENT DISPOSITION:  PACU - hemodynamically stable.   Delay start of Pharmacological VTE agent (>24hrs) due to surgical blood loss or risk of bleeding:  yes

## 2021-10-16 NOTE — Progress Notes (Signed)
Pt OOB, ambulated to BR to void x1, ambulated 500 ft in hallway with RN stand by. Pt sitting up in chair in room with spouse in room. Will continue to closely monitor. Delia Heady RN

## 2021-10-16 NOTE — H&P (Signed)
Subjective:   Patient is a 71 y.o. female admitted for cervical stenosis. The patient first presented to me with complaints of neck pain, arm pain, and numbness of the arm(s). Onset of symptoms was several months ago. The pain is described as aching and occurs all day. The pain is rated severe, and is located in the neck and radiates to the arms. The symptoms have been progressive. Symptoms are exacerbated by extending head backwards, and are relieved by none.  Previous work up includes MRI of cervical spine, results: spinal stenosis.  Past Medical History:  Diagnosis Date   Anemia    Anxiety 2000   Arthritis    Depression 2000   Fibromyalgia    GERD (gastroesophageal reflux disease)    Headache(784.0)    PONV (postoperative nausea and vomiting)    Seasonal allergies     Past Surgical History:  Procedure Laterality Date   ABDOMINAL HYSTERECTOMY  1984   BACK SURGERY  2006, 2008   lower back, fusion in l5-s1   CHOLECYSTECTOMY  2003   ERCP W/ SPHINCTEROTOMY AND BALLOON DILATION  2005   pancreatitis    LUMBAR LAMINECTOMY/DECOMPRESSION MICRODISCECTOMY N/A 05/31/2012   Procedure: MICRO LUMBAR DECOMPRESSION L4 - L5, L5 - S1 ;  Surgeon: Johnn Hai, MD;  Location: [facility] ORS;  Service: Orthopedics;  Laterality: N/A;   LUMBAR LAMINECTOMY/DECOMPRESSION MICRODISCECTOMY Right 02/06/2014   Procedure: LUMBAR DECOMPRESSION L3-L4, REDO LUMBAR DECOMPRESSION L4-L5 ON THE RIGHT;  Surgeon: Johnn Hai, MD;  Location: [facility] ORS;  Service: Orthopedics;  Laterality: Right;   NECK SURGERY  2001,2007   herniated disc- cervical fusion(limited side to side mobility)   SHOULDER ARTHROSCOPY Bilateral 2010, 2011   debridement    No Known Allergies  Social History   Tobacco Use   Smoking status: Former    Packs/day: 0.50    Years: 3.00    Total pack years: 1.50    Types: Cigarettes    Quit date: 02/02/1972    Years since quitting: 49.7   Smokeless tobacco: Never  Substance Use Topics   Alcohol use: No     History reviewed. No pertinent family history. Prior to Admission medications   Medication Sig Start Date End Date Taking? Authorizing Provider  ascorbic acid (VITAMIN C) 500 MG tablet Take 500 mg by mouth daily.   Yes [provider]  Calcium Carb-Cholecalciferol (CALCIUM 600 + D PO) Take 1 tablet by mouth daily.   Yes [provider]  Cholecalciferol (VITAMIN D) 125 MCG (5000 UT) CAPS Take 5,000 Units by mouth daily.   Yes [provider]  cyclobenzaprine (FLEXERIL) 10 MG tablet Take 10 mg by mouth 3 (three) times daily as needed for muscle spasms.   Yes [provider]  docusate sodium (COLACE) 100 MG capsule Take 1 capsule (100 mg total) by mouth 2 (two) times daily as needed for mild constipation. Patient taking differently: Take 100 mg by mouth daily. 02/06/14  Yes Susa Day, MD  Ferrous Sulfate (SLOW RELEASE IRON PO) Take 1 tablet by mouth daily.   Yes [provider]  fexofenadine (ALLEGRA) 180 MG tablet Take 180 mg by mouth daily.   Yes [provider]  Ginkgo Biloba 60 MG TABS Take 60 mg by mouth in the morning and at bedtime.   Yes [provider]  glycopyrrolate (ROBINUL) 2 MG tablet Take 2 mg by mouth 2 (two) times daily.   Yes [provider]  lidocaine (LIDODERM) 5 % Place 3 patches onto the  skin daily as needed (pain). Remove & Discard patch within 12 hours or as directed by MD   Yes [provider]  magnesium gluconate (MAGONATE) 500 MG tablet Take 500 mg by mouth daily.   Yes [provider]  Multiple Vitamins-Minerals (MULTIVITAMIN PO) Take 1 tablet by mouth daily.   Yes [provider]  ondansetron (ZOFRAN) 4 MG tablet Take 4 mg by mouth every 6 (six) hours as needed for nausea/vomiting. 05/02/21  Yes [provider]  oxyCODONE-acetaminophen (PERCOCET) 10-325 MG tablet Take 1 tablet by mouth every 4 (four) hours as needed for pain. 06/18/20  Yes Eustace Moore, MD   pantoprazole (PROTONIX) 40 MG tablet Take 40 mg by mouth 2 (two) times daily.    Yes [provider]  Polyethyl Glycol-Propyl Glycol (SYSTANE ULTRA OP) Place 1 drop into both eyes 2 (two) times daily as needed (dry eyes).   Yes [provider]  polyethylene glycol (MIRALAX / GLYCOLAX) packet Take 17 g by mouth daily.   Yes [provider]  Potassium 99 MG TABS Take 99 mg by mouth daily.   Yes [provider]  TURMERIC CURCUMIN PO Take 1,000 mg by mouth daily.   Yes [provider]  vitamin B-12 (CYANOCOBALAMIN) 1000 MCG tablet Take 1,000 mcg by mouth in the morning and at bedtime.   Yes [provider]  zinc gluconate 50 MG tablet Take 50 mg by mouth daily.   Yes [provider]  acyclovir (ZOVIRAX) 200 MG capsule Take 200 mg by mouth 5 (five) times daily as needed (fever blisters). 10/07/21   [provider]     Review of Systems  Positive ROS: neg  All other systems have been reviewed and were otherwise negative with the exception of those mentioned in the HPI and as above.  Objective: Vital signs in last 24 hours: Temp:  [97.7 F (36.5 C)] 97.7 F (36.5 C) (09/15 0813) Pulse Rate:  [86] 86 (09/15 0813) Resp:  [18] 18 (09/15 0813) BP: (151)/(86) 151/86 (09/15 0813) SpO2:  [97 %] 97 % (09/15 0813) Weight:  [72.6 kg] 72.6 kg (09/15 0813)  General Appearance: Alert, cooperative, no distress, appears stated age Head: Normocephalic, without obvious abnormality, atraumatic Eyes: PERRL, conjunctiva/corneas clear, EOM's intact      Neck: Supple, symmetrical, trachea midline, Back: Symmetric, no curvature, ROM normal, no CVA tenderness Lungs:  respirations unlabored Heart: Regular rate and rhythm Abdomen: Soft, non-tender Extremities: Extremities normal, atraumatic, no cyanosis or edema Pulses: 2+ and symmetric all extremities Skin: Skin color, texture, turgor normal, no rashes or lesions  NEUROLOGIC:  Mental  status: Alert and oriented x4, no aphasia, good attention span, fund of knowledge and memory  Motor Exam - grossly normal Sensory Exam - grossly normal Reflexes: 1+ Coordination - grossly normal Gait - grossly normal Balance - grossly normal Cranial Nerves: I: smell Not tested  II: visual acuity  OS: nl    OD: nl  II: visual fields Full to confrontation  II: pupils Equal, round, reactive to light  III,VII: ptosis None  III,IV,VI: extraocular muscles  Full ROM  V: mastication Normal  V: facial light touch sensation  Normal  V,VII: corneal reflex  Present  VII: facial muscle function - upper  Normal  VII: facial muscle function - lower Normal  VIII: hearing Not tested  IX: soft palate elevation  Normal  IX,X: gag reflex Present  XI: trapezius strength  5/5  XI: sternocleidomastoid strength 5/5  XI: neck flexion  strength  5/5  XII: tongue strength  Normal    Data Review Lab Results  Component Value Date   WBC 6.2 10/14/2021   HGB 12.6 10/14/2021   HCT 37.9 10/14/2021   MCV 96.2 10/14/2021   PLT 281 10/14/2021   Lab Results  Component Value Date   NA 137 06/13/2020   K 4.6 06/13/2020   CL 100 06/13/2020   CO2 32 06/13/2020   BUN 12 06/13/2020   CREATININE 0.79 06/13/2020   GLUCOSE 97 06/13/2020   Lab Results  Component Value Date   INR 0.9 06/13/2020    Assessment:   Cervical neck pain with herniated nucleus pulposus/ spondylosis/ stenosis at C3-4. Estimated body mass index is 26.63 kg/m as calculated from the following:   Height as of this encounter: '5\' 5"'$  (1.651 m).   Weight as of this encounter: 72.6 kg.  Patient has failed conservative therapy. Planned surgery : ACDF C3-4  Plan:   I explained the condition and procedure to the patient and answered any questions.  Patient wishes to proceed with procedure as planned. Understands risks/ benefits/ and expected or typical outcomes.  Eustace Moore 10/16/2021 10:54 AM

## 2021-10-16 NOTE — Anesthesia Preprocedure Evaluation (Addendum)
Anesthesia Evaluation  Patient identified by MRN, date of birth, ID band Patient awake    Reviewed: Allergy & Precautions, NPO status , Patient's Chart, lab work & pertinent test results  History of Anesthesia Complications (+) PONV and history of anesthetic complications  Airway Mallampati: II  TM Distance: >3 FB Neck ROM: Limited    Dental  (+) Dental Advisory Given, Teeth Intact   Pulmonary neg pulmonary ROS, former smoker,    Pulmonary exam normal        Cardiovascular negative cardio ROS Normal cardiovascular exam     Neuro/Psych  Headaches, PSYCHIATRIC DISORDERS Anxiety Depression    GI/Hepatic Neg liver ROS, hiatal hernia, GERD  Medicated and Controlled,  Endo/Other  negative endocrine ROS  Renal/GU negative Renal ROS  Female GU complaint     Musculoskeletal  (+) Arthritis , Fibromyalgia -, narcotic dependent  Abdominal   Peds  Hematology negative hematology ROS (+)   Anesthesia Other Findings HSV   Reproductive/Obstetrics                            Anesthesia Physical Anesthesia Plan  ASA: 2  Anesthesia Plan: General   Post-op Pain Management: Tylenol PO (pre-op)*   Induction: Intravenous  PONV Risk Score and Plan: 4 or greater and Treatment may vary due to age or medical condition, Ondansetron, Dexamethasone, Scopolamine patch - Pre-op and Midazolam  Airway Management Planned: Oral ETT and Video Laryngoscope Planned  Additional Equipment: None  Intra-op Plan:   Post-operative Plan: Extubation in OR  Informed Consent: I have reviewed the patients History and Physical, chart, labs and discussed the procedure including the risks, benefits and alternatives for the proposed anesthesia with the patient or authorized representative who has indicated his/her understanding and acceptance.     Dental advisory given  Plan Discussed with: CRNA and  Anesthesiologist  Anesthesia Plan Comments:       Anesthesia Quick Evaluation

## 2021-10-16 NOTE — Anesthesia Postprocedure Evaluation (Signed)
Anesthesia Post Note  Patient: [name]  Procedure(s) Performed: Anterior Cervical Discectomy and Fusion Cervical Three-Four     Patient location during evaluation: PACU Anesthesia Type: General Level of consciousness: awake and alert Pain management: pain level controlled Vital Signs Assessment: post-procedure vital signs reviewed and stable Respiratory status: spontaneous breathing, nonlabored ventilation and respiratory function stable Cardiovascular status: stable and blood pressure returned to baseline Anesthetic complications: no   No notable events documented.  Last Vitals:  Vitals:   10/16/21 1400 10/16/21 1415  BP: 134/71 (!) 141/77  Pulse: 78 77  Resp: (!) 21 11  Temp:  (!) 36.4 C  SpO2: 100% 97%    Last Pain:  Vitals:   10/16/21 1345  TempSrc:   PainSc: [place]

## 2021-10-17 DIAGNOSIS — M47892 Other spondylosis, cervical region: Secondary | ICD-10-CM | POA: Diagnosis not present

## 2021-10-17 NOTE — Discharge Summary (Signed)
Physician Discharge Summary  Patient ID: [name] MRN: [id number] DOB/AGE: [birth year] 71 y.o.  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 The patient tolerated the procedure well and was taken to the recovery room and then to the floor in stable condition. The hospital course was routine. There were no complications. The wound remained clean dry and intact. Pt had appropriate neck soreness. No complaints of arm pain or new N/T/W. The patient remained afebrile with stable vital signs, and tolerated a regular diet. The patient continued to increase activities, and pain was well controlled with oral pain medications.   Consults: None  Significant Diagnostic Studies:  Results for orders placed or performed during the hospital encounter of 10/14/21  Surgical pcr screen   Specimen: Nasal Mucosa; Nasal Swab  Result Value Ref Range   MRSA, PCR NEGATIVE NEGATIVE   Staphylococcus aureus NEGATIVE NEGATIVE  CBC per protocol  Result Value Ref Range   WBC 6.2 4.0 - 10.5 K/uL   RBC 3.94 3.87 - 5.11 MIL/uL   Hemoglobin 12.6 12.0 - 15.0 g/dL   HCT 37.9 36.0 - 46.0 %   MCV 96.2 80.0 - 100.0 fL   MCH 32.0 26.0 - 34.0 pg   MCHC 33.2 30.0 - 36.0 g/dL   RDW 12.1 11.5 - 15.5 %   Platelets 281 150 - 400 K/uL   nRBC 0.0 0.0 - 0.2 %    DG Cervical Spine 2 or 3 views  Result Date: 10/16/2021 CLINICAL DATA:  ACDF C3-4 EXAM: CERVICAL SPINE - 2-3 VIEW COMPARISON:  Cervical radiographs 08/18/2021 FINDINGS: Two lateral C-arm images of the cervical spine obtained Image number 1 demonstrates localization of the C3-4 disc space with anterior needle. Pre-existing ACDF C3-4, C4-5, C5-6. Anterior plate is Y4-8 and G5-0 Image number  2 demonstrates anterior plate and interbody graft at C3-4 in good position. IMPRESSION: ACDF C3-4. Electronically Signed   By: Franchot Gallo M.D.   On: 10/16/2021 13:49   DG C-Arm 1-60 Min-No Report  Result Date: 10/16/2021 Fluoroscopy was utilized by the requesting physician.  No radiographic interpretation.   DG C-Arm 1-60 Min-No Report  Result Date: 10/16/2021 Fluoroscopy was utilized by the requesting physician.  No radiographic interpretation.    Antibiotics:  Anti-infectives (From admission, onward)    Start     Dose/Rate Route Frequency Ordered Stop   10/16/21 2000  ceFAZolin (ANCEF) IVPB 2g/100 mL premix        2 g 200 mL/hr over 30 Minutes Intravenous Every 8 hours 10/16/21 1450 10/17/21 0444       Discharge Exam: Blood pressure 113/72, pulse 84, temperature 98.3 F (36.8 C), temperature source Oral, resp. rate 18, height '5\' 5"'$  (1.651 m), weight 72.6 kg, SpO2 98 %. Neurologic: Grossly normal Ambulating and voiding well incision cdi   Discharge Medications:   Allergies as of 10/17/2021   No Known Allergies      Medication List     TAKE these medications    acyclovir 200 MG capsule Commonly known as: ZOVIRAX Take 200 mg by mouth 5 (five) times daily as needed (fever blisters).   ascorbic acid 500 MG tablet Commonly known as: VITAMIN C Take 500 mg by mouth daily.   CALCIUM 600 +  D PO Take 1 tablet by mouth daily.   cyanocobalamin 1000 MCG tablet Commonly known as: VITAMIN B12 Take 1,000 mcg by mouth in the morning and at bedtime.   cyclobenzaprine 10 MG tablet Commonly known as: FLEXERIL Take 10 mg by mouth 3 (three) times daily as needed for muscle spasms.   docusate sodium 100 MG capsule Commonly known as: Colace Take 1 capsule (100 mg total) by mouth 2 (two) times daily as needed for mild constipation. What changed: when to take this   fexofenadine 180 MG tablet Commonly known as: ALLEGRA Take 180 mg by mouth daily.   Ginkgo Biloba 60 MG  Tabs Take 60 mg by mouth in the morning and at bedtime.   glycopyrrolate 2 MG tablet Commonly known as: ROBINUL Take 2 mg by mouth 2 (two) times daily.   lidocaine 5 % Commonly known as: LIDODERM Place 3 patches onto the skin daily as needed (pain). Remove & Discard patch within 12 hours or as directed by MD   magnesium gluconate 500 MG tablet Commonly known as: MAGONATE Take 500 mg by mouth daily.   MULTIVITAMIN PO Take 1 tablet by mouth daily.   ondansetron 4 MG tablet Commonly known as: ZOFRAN Take 4 mg by mouth every 6 (six) hours as needed for nausea/vomiting.   oxyCODONE-acetaminophen 10-325 MG tablet Commonly known as: PERCOCET Take 1 tablet by mouth every 4 (four) hours as needed for pain.   pantoprazole 40 MG tablet Commonly known as: PROTONIX Take 40 mg by mouth 2 (two) times daily.   polyethylene glycol 17 g packet Commonly known as: MIRALAX / GLYCOLAX Take 17 g by mouth daily.   Potassium 99 MG Tabs Take 99 mg by mouth daily.   SLOW RELEASE IRON PO Take 1 tablet by mouth daily.   SYSTANE ULTRA OP Place 1 drop into both eyes 2 (two) times daily as needed (dry eyes).   TURMERIC CURCUMIN PO Take 1,000 mg by mouth daily.   Vitamin D 125 MCG (5000 UT) Caps Take 5,000 Units by mouth daily.   zinc gluconate 50 MG tablet Take 50 mg by mouth daily.        Disposition: home   Final Dx: acdf C3-4  Discharge Instructions      Remove dressing in 72 hours   Complete by: As directed    Call MD for:  difficulty breathing, headache or visual disturbances   Complete by: As directed    Call MD for:  hives   Complete by: As directed    Call MD for:  persistant nausea and vomiting   Complete by: As directed    Call MD for:  redness, tenderness, or signs of infection (pain, swelling, redness, odor or green/yellow discharge around incision site)   Complete by: As directed    Call MD for:  severe uncontrolled pain   Complete by: As directed    Call MD  for:  temperature >100.4   Complete by: As directed    Diet - low sodium heart healthy   Complete by: As directed    Driving Restrictions   Complete by: As directed    No driving for 2 weeks, no riding in the car for 1 week   Increase activity slowly   Complete by: As directed    Lifting restrictions   Complete by: As directed    No lifting more than 8 lbs          Signed: Ocie Cornfield Leandrea Ackley 10/17/2021, 7:09 AM

## 2021-10-17 NOTE — Progress Notes (Signed)
Patient is discharged from room 3C05 at this time. Alert and in stable condition. IV site d/c'd and instructions read to patient and spouse with understanding verbalized and all questions answered. Left unit via wheelchair with all belongings at side.  

## 2021-10-17 NOTE — Discharge Instructions (Signed)
Wound Care  °You may remove outer bandage after 2 days and shower.  °Keep incision open to air. °Do not put any creams, lotions, or ointments on incision. ° °Activity °Walk each and every day, increasing distance each day. °No lifting greater than 5 lbs.  Avoid excessive neck motion. °No driving for 2 weeks; may ride as a passenger locally. °Wear neck brace at all times except when showering. °Diet °Resume your normal diet.  °Return to Work °Will be discussed at you follow up appointment. °Call Your Doctor If Any of These Occur °Redness, drainage, or swelling at the wound.  °Temperature greater than 101 degrees. °Severe pain not relieved by pain medication. °Increased difficulty swallowing.  °Incision starts to come apart. °Follow Up Appt °Call today and ask for appointment in 1-2 weeks ([phone]) or for problems.  If you have any hardware placed in your spine, you will need an x-ray before your appointment. ° °

## 2021-11-23 ENCOUNTER — Ambulatory Visit (HOSPITAL_COMMUNITY)
Admission: RE | Admit: 2021-11-23 | Discharge: 2021-11-23 | Disposition: A | Discharge status: Home / Self Care | Payer: Medicare Other | Source: Ambulatory Visit | Attending: Internal Medicine | Admitting: Internal Medicine

## 2021-11-23 DIAGNOSIS — R1310 Dysphagia, unspecified: Secondary | ICD-10-CM | POA: Diagnosis present

## 2021-11-23 DIAGNOSIS — K2289 Other specified disease of esophagus: Secondary | ICD-10-CM | POA: Diagnosis not present

## 2021-11-23 DIAGNOSIS — K224 Dyskinesia of esophagus: Secondary | ICD-10-CM | POA: Insufficient documentation

## 2021-11-23 DIAGNOSIS — R1314 Dysphagia, pharyngoesophageal phase: Secondary | ICD-10-CM | POA: Diagnosis not present

## 2021-11-23 DIAGNOSIS — K449 Diaphragmatic hernia without obstruction or gangrene: Secondary | ICD-10-CM | POA: Diagnosis not present

## 2021-11-23 DIAGNOSIS — Z9049 Acquired absence of other specified parts of digestive tract: Secondary | ICD-10-CM | POA: Diagnosis not present

## 2021-11-23 NOTE — Progress Notes (Signed)
Objective Swallowing Evaluation: Type of Study: MBS-Modified Barium Swallow Study   Patient Details  Name: CHRISTEL BAI MRN: [id number] Date of Birth: [birth year]  Today's Date: 11/23/2021 Time: SLP Start Time (ACUTE ONLY): 1105 -SLP Stop Time (ACUTE ONLY): 1134  SLP Time Calculation (min) (ACUTE ONLY): 29 min   Past Medical History:  Past Medical History:  Diagnosis Date   Anemia    Anxiety 2000   Arthritis    Depression 2000   Fibromyalgia    GERD (gastroesophageal reflux disease)    Headache(784.0)    PONV (postoperative nausea and vomiting)    Seasonal allergies    Past Surgical History:  Past Surgical History:  Procedure Laterality Date   ABDOMINAL HYSTERECTOMY  1984   ANTERIOR CERVICAL DECOMP/DISCECTOMY FUSION N/A 10/16/2021   Procedure: Anterior Cervical Discectomy and Fusion Cervical Three-Four;  Surgeon: Eustace Moore, MD;  Location: [facility];  Service: Neurosurgery;  Laterality: N/A;  3C   BACK SURGERY  2006, 2008   lower back, fusion in l5-s1   CHOLECYSTECTOMY  2003   ERCP W/ SPHINCTEROTOMY AND BALLOON DILATION  2005   pancreatitis    LUMBAR LAMINECTOMY/DECOMPRESSION MICRODISCECTOMY N/A 05/31/2012   Procedure: MICRO LUMBAR DECOMPRESSION L4 - L5, L5 - S1 ;  Surgeon: Johnn Hai, MD;  Location: [facility];  Service: Orthopedics;  Laterality: N/A;   LUMBAR LAMINECTOMY/DECOMPRESSION MICRODISCECTOMY Right 02/06/2014   Procedure: LUMBAR DECOMPRESSION L3-L4, REDO LUMBAR DECOMPRESSION L4-L5 ON THE RIGHT;  Surgeon: Johnn Hai, MD;  Location: [facility];  Service: Orthopedics;  Laterality: Right;   NECK SURGERY  2001,2007   herniated disc- cervical fusion(limited side to side mobility)   SHOULDER ARTHROSCOPY Bilateral 2010, 2011   debridement   HPI: Angila Wombles is a 71 y.o. female who was referred for an OP MBS. On 10/16/21 she underwent  ACDF C3-4 (referral for MBS was made prior to surgery). She has prior hx of hoarseness, was evaluated by Dr. Sabino Gasser, ENT, on 10/09/21.  Underwent laryngoscopy to r/o vocal cord paralysis. The vocal cords were mobile bilaterally with abduction and good adduction; mild vocal fold thinning with 1-89m posterior glottic gap (consistent with aging). No signs of recurrent laryngeal nerve injury. During ENT assessment, pt c/o solid food dysphagia. MBS was ordered to eval for CP dysfunction.  Pt reports hx of multiple dilatations years ago, recent solid food dysphagia with regurgitation (particularly with rice, chicken, bread), globus, "sticking" of food near larynx, which is resolved when she turns her head to the left during the swallow.   Subjective: participatory    Recommendations for follow up therapy are one component of a multi-disciplinary discharge planning process, led by the attending physician.  Recommendations may be updated based on patient status, additional functional criteria and insurance authorization.  Assessment / Plan / Recommendation     11/23/2021    4:00 PM  Clinical Impressions  Clinical Impression Ms. 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 Laryngeal vestibule closure is complete due to good contact between the arytenoids and epiglottic petiole. There was no aspiration - only transient penetration of thin liquids, PAS score of 2, considered WFL.  The epiglottis never fully inverted over the larynx due to ongoing prevertebral edema at level of C3-4. Impaired inversion leads to vallecular residue, particularly of solids, which pt is able to help clear when she turns her head to the left.  Notable was consistent retention of solid barium in the esophagus- a  liquid wash helped with clearance. We discussed the exam in real tim - there are two factors leading to her symptoms: one is the vallecular residue related to ACDF surgery, the other is the esophageal retention. Ms. Larzelere agrees to f/u with her GI physician to determine  next steps (esophagram vs EGD or both). No further SLP f/u is needed.  Anticipate improvements in mechanics of pharyngeal swallow/ epiglottic inversion with more time post-surgery.  SLP Visit Diagnosis Dysphagia, pharyngoesophageal phase (R13.14)  Impact on safety and function No limitations         11/23/2021    4:00 PM  Treatment Recommendations  Treatment Recommendations No treatment recommended at this time         No data to display             11/23/2021    4:00 PM  Diet Recommendations  SLP Diet Recommendations Regular solids;Thin liquid  Liquid Administration via Cup;Straw  Medication Administration Whole meds with liquid  Compensations Follow solids with liquid         11/23/2021    4:00 PM  Other Recommendations  Recommended Consults Consider GI evaluation  Oral Care Recommendations Oral care BID  Follow Up Recommendations No SLP follow up        No data to display               11/23/2021    4:00 PM  Oral Phase  Oral Phase [facility]       11/23/2021    4:00 PM  Pharyngeal Phase  Pharyngeal Phase Impaired  Pharyngeal- Thin Straw Delayed swallow initiation-pyriform sinuses;Reduced epiglottic inversion;Penetration/Aspiration before swallow;Pharyngeal residue - valleculae  Pharyngeal Material enters airway, remains ABOVE vocal cords then ejected out  Pharyngeal- Puree Delayed swallow initiation-vallecula;Reduced epiglottic inversion;Pharyngeal residue - valleculae  Pharyngeal- Regular Delayed swallow initiation-vallecula;Reduced epiglottic inversion;Pharyngeal residue - valleculae        11/23/2021    4:00 PM  Cervical Esophageal Phase   Cervical Esophageal Phase Impaired     Juan Quam Laurice 11/23/2021, 5:24 PM   Cortez Flippen L. Tivis Ringer, MA CCC/SLP Clinical Specialist - [place] Office number [phone]

## 2022-06-15 ENCOUNTER — Other Ambulatory Visit: Payer: Self-pay | Admitting: Neurological Surgery

## 2022-06-22 NOTE — Pre-Procedure Instructions (Signed)
Surgical Instructions    Your procedure is scheduled on Wednesday, Jun 30, 2022 at 12:21 PM.  Report to [facility] Main Entrance "A" at 10:20 A.M., then check in with the Admitting office.  Call this number if you have problems the morning of surgery:  [phone]   If you have any questions prior to your surgery date call [phone]: Open Monday-Friday 8am-4pm  *If you experience any cold or flu symptoms such as cough, fever, chills, shortness of breath, etc. between now and your scheduled surgery, please notify us.*    Remember:  Do not eat after midnight the night before your surgery  You may drink clear liquids until 9:20 AM the morning of your surgery.   Clear liquids allowed are: Water, Non-Citrus Juices (without pulp), Carbonated Beverages, Clear Tea, Black Coffee Only (NO MILK, CREAM OR POWDERED CREAMER of any kind), and Gatorade.    Take these medicines the morning of surgery with A SIP OF WATER:  fexofenadine (ALLEGRA)  glycopyrrolate (ROBINUL)  pantoprazole (PROTONIX)   IF NEEDED: acyclovir (ZOVIRAX)  cyclobenzaprine (FLEXERIL)  ondansetron (ZOFRAN)  oxyCODONE-acetaminophen (PERCOCET)  Polyethyl Glycol-Propyl Glycol (SYSTANE ULTRA OP)   As of today, STOP taking any Aspirin (unless otherwise instructed by your surgeon) Aleve, Naproxen, Ibuprofen, Motrin, Advil, Goody's, BC's, all herbal medications, fish oil, and all vitamins.                     Do NOT Smoke (Tobacco/Vaping) for 24 hours prior to your procedure.  If you use a CPAP at night, you may bring your mask/headgear for your overnight stay.   Contacts, glasses, piercing's, hearing aid's, dentures or partials may not be worn into surgery, please bring cases for these belongings.    For patients admitted to the hospital, discharge time will be determined by your treatment team.   Patients discharged the day of surgery will not be allowed to drive home, and someone needs to stay with them for 24  hours.  SURGICAL WAITING ROOM VISITATION Patients having surgery or a procedure may have 2 support people in the waiting area. Visitors may stay in the waiting area during the procedure and switch out with other visitors if needed. Only 1 support person is allowed in the pre-op area with the patient AFTER the patient is prepped. This person cannot be switched out.  Children under the age of 9 must have an adult accompany them who is not the patient. If the patient needs to stay at the hospital during part of their recovery, the visitor guidelines for inpatient rooms apply.  Please refer to the [facility] website for the visitor guidelines for Inpatients (after your surgery is over and you are in a regular room).     Oral Hygiene is also important to reduce your risk of infection.  Remember - BRUSH YOUR TEETH THE MORNING OF SURGERY WITH YOUR REGULAR TOOTHPASTE    Pre-operative 5 CHG Bath Instructions   You can play a key role in reducing the risk of infection after surgery. Your skin needs to be as free of germs as possible. You can reduce the number of germs on your skin by washing with CHG (chlorhexidine gluconate) soap before surgery. CHG is an antiseptic soap that kills germs and continues to kill germs even after washing.   DO NOT use if you have an allergy to chlorhexidine/CHG or antibacterial soaps. If your skin becomes reddened or irritated, stop using the CHG and notify one of our RNs at  [phone].   Please shower with the CHG soap starting 4 days before surgery using the following schedule:     Please keep in mind the following:  DO NOT shave, including legs and underarms, starting the day of your first shower.   You may shave your face at any point before/day of surgery.  Place clean sheets on your bed the day you start using CHG soap. Use a clean washcloth (not used since being washed) for each shower. DO NOT sleep with pets once you start using the CHG.   CHG Shower  Instructions:  If you choose to wash your hair and private area, wash first with your normal shampoo/soap.  After you use shampoo/soap, rinse your hair and body thoroughly to remove shampoo/soap residue.  Turn the water OFF and apply about 3 tablespoons (45 ml) of CHG soap to a CLEAN washcloth.  Apply CHG soap ONLY FROM YOUR NECK DOWN TO YOUR TOES (washing for 3-5 minutes)  DO NOT use CHG soap on face, private areas, open wounds, or sores.  Pay special attention to the area where your surgery is being performed.  If you are having back surgery, having someone wash your back for you may be helpful. Wait 2 minutes after CHG soap is applied, then you may rinse off the CHG soap.  Pat dry with a clean towel  Put on clean clothes/pajamas   If you choose to wear lotion, please use ONLY the CHG-compatible lotions on the back of this paper.   Additional instructions for the day of surgery: DO NOT APPLY any lotions, deodorants, cologne, or perfumes.   Do not bring valuables to the hospital.  Do not wear nail polish, gel polish, artificial nails, or any other type of covering on natural nails (fingers and toes) Do not wear jewelry or makeup Put on clean/comfortable clothes.  Please brush your teeth.  Ask your nurse before applying any prescription medications to the skin.     CHG Compatible Lotions   Aveeno Moisturizing lotion  Cetaphil Moisturizing Cream  Cetaphil Moisturizing Lotion  Clairol Herbal Essence Moisturizing Lotion, Dry Skin  Clairol Herbal Essence Moisturizing Lotion, Extra Dry Skin  Clairol Herbal Essence Moisturizing Lotion, Normal Skin  Curel Age Defying Therapeutic Moisturizing Lotion with Alpha Hydroxy  Curel Extreme Care Body Lotion  Curel Soothing Hands Moisturizing Hand Lotion  Curel Therapeutic Moisturizing Cream, Fragrance-Free  Curel Therapeutic Moisturizing Lotion, Fragrance-Free  Curel Therapeutic Moisturizing Lotion, Original Formula  Eucerin Daily Replenishing  Lotion  Eucerin Dry Skin Therapy Plus Alpha Hydroxy Crme  Eucerin Dry Skin Therapy Plus Alpha Hydroxy Lotion  Eucerin Original Crme  Eucerin Original Lotion  Eucerin Plus Crme Eucerin Plus Lotion  Eucerin TriLipid Replenishing Lotion  Keri Anti-Bacterial Hand Lotion  Keri Deep Conditioning Original Lotion Dry Skin Formula Softly Scented  Keri Deep Conditioning Original Lotion, Fragrance Free Sensitive Skin Formula  Keri Lotion Fast Absorbing Fragrance Free Sensitive Skin Formula  Keri Lotion Fast Absorbing Softly Scented Dry Skin Formula  Keri Original Lotion  Keri Skin Renewal Lotion Keri Silky Smooth Lotion  Keri Silky Smooth Sensitive Skin Lotion  Nivea Body Creamy Conditioning Oil  Nivea Body Extra Enriched Teacher, adult education Moisturizing Lotion Nivea Crme  Nivea Skin Firming Lotion  NutraDerm 30 Skin Lotion  NutraDerm Skin Lotion  NutraDerm Therapeutic Skin Cream  NutraDerm Therapeutic Skin Lotion  ProShield Protective Hand Cream  Provon moisturizing lotion    Please read over the following fact sheets  that you were given.  If you received a COVID test during your pre-op visit  it is requested that you wear a mask when out in public, stay away from anyone that may not be feeling well and notify your surgeon if you develop symptoms. If you have been in contact with anyone that has tested positive in the last 10 days please notify you surgeon.

## 2022-06-23 ENCOUNTER — Encounter (HOSPITAL_COMMUNITY): Payer: Self-pay

## 2022-06-23 ENCOUNTER — Encounter (HOSPITAL_COMMUNITY)
Admission: RE | Admit: 2022-06-23 | Discharge: 2022-06-23 | Disposition: A | Discharge status: Home / Self Care | Payer: Medicare Other | Source: Ambulatory Visit | Attending: Neurological Surgery | Admitting: Neurological Surgery

## 2022-06-23 ENCOUNTER — Other Ambulatory Visit: Payer: Self-pay

## 2022-06-23 VITALS — BP 141/74 | HR 81 | Temp 98.9°F | Resp 18 | Ht 65.0 in | Wt 155.9 lb

## 2022-06-23 DIAGNOSIS — G988 Other disorders of nervous system: Secondary | ICD-10-CM

## 2022-06-23 DIAGNOSIS — Z01812 Encounter for preprocedural laboratory examination: Secondary | ICD-10-CM | POA: Insufficient documentation

## 2022-06-23 DIAGNOSIS — Z01818 Encounter for other preprocedural examination: Secondary | ICD-10-CM

## 2022-06-23 HISTORY — DX: Family history of other specified conditions: Z84.89

## 2022-06-23 HISTORY — DX: Pneumonia, unspecified organism: J18.9

## 2022-06-23 LAB — CBC
HCT: 39.9 % (ref 36.0–46.0)
Hemoglobin: 12.9 g/dL (ref 12.0–15.0)
MCH: 31.4 pg (ref 26.0–34.0)
MCHC: 32.3 g/dL (ref 30.0–36.0)
MCV: 97.1 fL (ref 80.0–100.0)
Platelets: 304 10*3/uL (ref 150–400)
RBC: 4.11 MIL/uL (ref 3.87–5.11)
RDW: 12.7 % (ref 11.5–15.5)
WBC: 5.8 10*3/uL (ref 4.0–10.5)
nRBC: 0 % (ref 0.0–0.2)

## 2022-06-23 LAB — BASIC METABOLIC PANEL
Anion gap: 9 (ref 5–15)
BUN: 10 mg/dL (ref 8–23)
CO2: 29 mmol/L (ref 22–32)
Calcium: 9.4 mg/dL (ref 8.9–10.3)
Chloride: 97 mmol/L — ABNORMAL LOW (ref 98–111)
Creatinine, Ser: 0.89 mg/dL (ref 0.44–1.00)
GFR, Estimated: >60 mL/min (ref >60)
Glucose, Bld: 102 mg/dL — ABNORMAL HIGH (ref 70–99)
Potassium: 4.1 mmol/L (ref 3.5–5.1)
Sodium: 135 mmol/L (ref 135–145)

## 2022-06-23 LAB — PROTIME-INR
INR: 0.9 (ref 0.8–1.2)
Prothrombin Time: 12.4 seconds (ref 11.4–15.2)

## 2022-06-23 LAB — TYPE AND SCREEN
ABO/RH(D): A POS
Antibody Screen: NEGATIVE

## 2022-06-23 LAB — SURGICAL PCR SCREEN
MRSA, PCR: NEGATIVE
Staphylococcus aureus: POSITIVE — AB

## 2022-06-23 NOTE — Pre-Procedure Instructions (Signed)
Surgical Instructions    Your procedure is scheduled on Wednesday, Jun 30, 2022 at 12:21 PM.  Report to [facility] Main Entrance "A" at 10:20 A.M., then check in with the Admitting office.  Call this number if you have problems the morning of surgery:  [phone]   If you have any questions prior to your surgery date call [phone]: Open Monday-Friday 8am-4pm  *If you experience any cold or flu symptoms such as cough, fever, chills, shortness of breath, etc. between now and your scheduled surgery, please notify us.*    Remember:  Do not eat after midnight the night before your surgery  You may drink clear liquids until 9:20 AM the morning of your surgery.   Clear liquids allowed are: Water, Non-Citrus Juices (without pulp), Carbonated Beverages, Clear Tea, Black Coffee Only (NO MILK, CREAM OR POWDERED CREAMER of any kind), and Gatorade.    Take these medicines the morning of surgery with A SIP OF WATER: cetirizine (ZYRTEC)  DULoxetine (CYMBALTA)  glycopyrrolate (ROBINUL)  pantoprazole (PROTONIX)   IF NEEDED: acyclovir (ZOVIRAX)  docusate sodium (COLACE)  methocarbamol (ROBAXIN)  ondansetron (ZOFRAN)  oxyCODONE-acetaminophen (PERCOCET)  Polyethyl Glycol-Propyl Glycol (SYSTANE ULTRA OP) eye drops sodium chloride (OCEAN) 0.65 % SOLN nasal spray   As of today, STOP taking any Aspirin (unless otherwise instructed by your surgeon) Aleve, Naproxen, Ibuprofen, Motrin, Advil, Goody's, BC's, all herbal medications, fish oil, and all vitamins.                     Do NOT Smoke (Tobacco/Vaping) for 24 hours prior to your procedure.  If you use a CPAP at night, you may bring your mask/headgear for your overnight stay.   Contacts, glasses, piercing's, hearing aid's, dentures or partials may not be worn into surgery, please bring cases for these belongings.    For patients admitted to the hospital, discharge time will be determined by your treatment team.   Patients discharged the  day of surgery will not be allowed to drive home, and someone needs to stay with them for 24 hours.  SURGICAL WAITING ROOM VISITATION Patients having surgery or a procedure may have 2 support people in the waiting area. Visitors may stay in the waiting area during the procedure and switch out with other visitors if needed. Only 1 support person is allowed in the pre-op area with the patient AFTER the patient is prepped. This person cannot be switched out.  Children under the age of 49 must have an adult accompany them who is not the patient. If the patient needs to stay at the hospital during part of their recovery, the visitor guidelines for inpatient rooms apply.  Please refer to the [facility] website for the visitor guidelines for Inpatients (after your surgery is over and you are in a regular room).     Oral Hygiene is also important to reduce your risk of infection.  Remember - BRUSH YOUR TEETH THE MORNING OF SURGERY WITH YOUR REGULAR TOOTHPASTE    Pre-operative 5 CHG Bath Instructions   You can play a key role in reducing the risk of infection after surgery. Your skin needs to be as free of germs as possible. You can reduce the number of germs on your skin by washing with CHG (chlorhexidine gluconate) soap before surgery. CHG is an antiseptic soap that kills germs and continues to kill germs even after washing.   DO NOT use if you have an allergy to chlorhexidine/CHG or antibacterial soaps. If your  skin becomes reddened or irritated, stop using the CHG and notify one of our RNs at [phone].   Please shower with the CHG soap starting 4 days before surgery using the following schedule:     Please keep in mind the following:  DO NOT shave, including legs and underarms, starting the day of your first shower.   You may shave your face at any point before/day of surgery.  Place clean sheets on your bed the day you start using CHG soap. Use a clean washcloth (not used since being  washed) for each shower. DO NOT sleep with pets once you start using the CHG.   CHG Shower Instructions:  If you choose to wash your hair and private area, wash first with your normal shampoo/soap.  After you use shampoo/soap, rinse your hair and body thoroughly to remove shampoo/soap residue.  Turn the water OFF and apply about 3 tablespoons (45 ml) of CHG soap to a CLEAN washcloth.  Apply CHG soap ONLY FROM YOUR NECK DOWN TO YOUR TOES (washing for 3-5 minutes)  DO NOT use CHG soap on face, private areas, open wounds, or sores.  Pay special attention to the area where your surgery is being performed.  If you are having back surgery, having someone wash your back for you may be helpful. Wait 2 minutes after CHG soap is applied, then you may rinse off the CHG soap.  Pat dry with a clean towel  Put on clean clothes/pajamas   If you choose to wear lotion, please use ONLY the CHG-compatible lotions on the back of this paper.   Additional instructions for the day of surgery: DO NOT APPLY any lotions, deodorants, cologne, or perfumes.   Do not bring valuables to the hospital.  Do not wear nail polish, gel polish, artificial nails, or any other type of covering on natural nails (fingers and toes) Do not wear jewelry or makeup Put on clean/comfortable clothes.  Please brush your teeth.  Ask your nurse before applying any prescription medications to the skin.     CHG Compatible Lotions   Aveeno Moisturizing lotion  Cetaphil Moisturizing Cream  Cetaphil Moisturizing Lotion  Clairol Herbal Essence Moisturizing Lotion, Dry Skin  Clairol Herbal Essence Moisturizing Lotion, Extra Dry Skin  Clairol Herbal Essence Moisturizing Lotion, Normal Skin  Curel Age Defying Therapeutic Moisturizing Lotion with Alpha Hydroxy  Curel Extreme Care Body Lotion  Curel Soothing Hands Moisturizing Hand Lotion  Curel Therapeutic Moisturizing Cream, Fragrance-Free  Curel Therapeutic Moisturizing Lotion,  Fragrance-Free  Curel Therapeutic Moisturizing Lotion, Original Formula  Eucerin Daily Replenishing Lotion  Eucerin Dry Skin Therapy Plus Alpha Hydroxy Crme  Eucerin Dry Skin Therapy Plus Alpha Hydroxy Lotion  Eucerin Original Crme  Eucerin Original Lotion  Eucerin Plus Crme Eucerin Plus Lotion  Eucerin TriLipid Replenishing Lotion  Keri Anti-Bacterial Hand Lotion  Keri Deep Conditioning Original Lotion Dry Skin Formula Softly Scented  Keri Deep Conditioning Original Lotion, Fragrance Free Sensitive Skin Formula  Keri Lotion Fast Absorbing Fragrance Free Sensitive Skin Formula  Keri Lotion Fast Absorbing Softly Scented Dry Skin Formula  Keri Original Lotion  Keri Skin Renewal Lotion Keri Silky Smooth Lotion  Keri Silky Smooth Sensitive Skin Lotion  Nivea Body Creamy Conditioning Oil  Nivea Body Extra Enriched Teacher, adult education Moisturizing Lotion Nivea Crme  Nivea Skin Firming Lotion  NutraDerm 30 Skin Lotion  NutraDerm Skin Lotion  NutraDerm Therapeutic Skin Cream  NutraDerm Therapeutic Skin Lotion  ProShield Protective  Hand Cream  Provon moisturizing lotion    Please read over the following fact sheets that you were given.  If you received a COVID test during your pre-op visit  it is requested that you wear a mask when out in public, stay away from anyone that may not be feeling well and notify your surgeon if you develop symptoms. If you have been in contact with anyone that has tested positive in the last 10 days please notify you surgeon.

## 2022-06-23 NOTE — Progress Notes (Signed)
PCP - Lytle Michaels PA Cardiologist - denies  PPM/ICD - denies  Chest x-ray - N/A EKG - N/A Stress Test - denies ECHO - denies Cardiac Cath - denies  Sleep Study - denies  Fasting Blood Sugar - N/A  Last dose of GLP1 agonist-  N/A  Blood Thinner Instructions: N/A Aspirin Instructions:N/A  ERAS Protcol - ERAS per order   COVID TEST- N/A   Anesthesia review: no  Patient denies shortness of breath, fever, cough and chest pain at PAT appointment   All instructions explained to the patient, with a verbal understanding of the material. Patient agrees to go over the instructions while at home for a better understanding. The opportunity to ask questions was provided.

## 2022-06-29 NOTE — Progress Notes (Signed)
Patient was called to inform that the surgery time for tomorrow was changed from 12:21 to 14:36 o'clock. Patient was instructed to be at the hospital at 12:30 o'clock and stop drinking clear liquids at 11:30 o'clock. Patient verbalized understanding.

## 2022-06-30 ENCOUNTER — Encounter (HOSPITAL_COMMUNITY)
Admission: RE | Disposition: A | Discharge status: Home / Self Care | Payer: Self-pay | Source: Home / Self Care | Attending: Neurological Surgery

## 2022-06-30 ENCOUNTER — Inpatient Hospital Stay (HOSPITAL_COMMUNITY): Payer: Medicare Other

## 2022-06-30 ENCOUNTER — Inpatient Hospital Stay (HOSPITAL_COMMUNITY): Payer: Medicare Other | Admitting: Certified Registered"

## 2022-06-30 ENCOUNTER — Other Ambulatory Visit: Payer: Self-pay

## 2022-06-30 ENCOUNTER — Encounter (HOSPITAL_COMMUNITY): Payer: Self-pay | Admitting: Neurological Surgery

## 2022-06-30 ENCOUNTER — Inpatient Hospital Stay (HOSPITAL_COMMUNITY)
Admission: RE | Admit: 2022-06-30 | Discharge: 2022-07-01 | DRG: 473 | Disposition: A | Discharge status: Home / Self Care | Payer: Medicare Other | Attending: Neurological Surgery | Admitting: Neurological Surgery

## 2022-06-30 DIAGNOSIS — Z90710 Acquired absence of both cervix and uterus: Secondary | ICD-10-CM | POA: Diagnosis not present

## 2022-06-30 DIAGNOSIS — M960 Pseudarthrosis after fusion or arthrodesis: Secondary | ICD-10-CM

## 2022-06-30 DIAGNOSIS — Z9049 Acquired absence of other specified parts of digestive tract: Secondary | ICD-10-CM

## 2022-06-30 DIAGNOSIS — K219 Gastro-esophageal reflux disease without esophagitis: Secondary | ICD-10-CM | POA: Diagnosis present

## 2022-06-30 DIAGNOSIS — M5412 Radiculopathy, cervical region: Secondary | ICD-10-CM | POA: Diagnosis present

## 2022-06-30 DIAGNOSIS — M1990 Unspecified osteoarthritis, unspecified site: Secondary | ICD-10-CM | POA: Diagnosis present

## 2022-06-30 DIAGNOSIS — F32A Depression, unspecified: Secondary | ICD-10-CM | POA: Diagnosis present

## 2022-06-30 DIAGNOSIS — Z981 Arthrodesis status: Principal | ICD-10-CM

## 2022-06-30 DIAGNOSIS — M797 Fibromyalgia: Secondary | ICD-10-CM | POA: Diagnosis present

## 2022-06-30 DIAGNOSIS — J302 Other seasonal allergic rhinitis: Secondary | ICD-10-CM | POA: Diagnosis present

## 2022-06-30 DIAGNOSIS — M4802 Spinal stenosis, cervical region: Secondary | ICD-10-CM | POA: Diagnosis present

## 2022-06-30 DIAGNOSIS — F419 Anxiety disorder, unspecified: Secondary | ICD-10-CM | POA: Diagnosis present

## 2022-06-30 DIAGNOSIS — D649 Anemia, unspecified: Secondary | ICD-10-CM | POA: Diagnosis not present

## 2022-06-30 DIAGNOSIS — Z79899 Other long term (current) drug therapy: Secondary | ICD-10-CM

## 2022-06-30 DIAGNOSIS — Z87891 Personal history of nicotine dependence: Secondary | ICD-10-CM

## 2022-06-30 HISTORY — PX: POSTERIOR CERVICAL FUSION/FORAMINOTOMY: SHX5038

## 2022-06-30 SURGERY — POSTERIOR CERVICAL FUSION/FORAMINOTOMY LEVEL 4
Anesthesia: General | Site: Neck

## 2022-06-30 MED ORDER — DEXAMETHASONE SODIUM PHOSPHATE 10 MG/ML IJ SOLN
INTRAMUSCULAR | Status: DC | PRN
  Administered 2022-06-30: 10 mg via INTRAVENOUS

## 2022-06-30 MED ORDER — POTASSIUM CHLORIDE IN NACL 20-0.9 MEQ/L-% IV SOLN: INTRAVENOUS | Status: DC

## 2022-06-30 MED ORDER — LIDOCAINE 2% (20 MG/ML) 5 ML SYRINGE
INTRAMUSCULAR | Status: DC | PRN
  Administered 2022-06-30: 60 mg via INTRAVENOUS

## 2022-06-30 MED ORDER — PHENOL 1.4 % MT LIQD: 1.0000 | OROMUCOSAL | Status: DC | PRN

## 2022-06-30 MED ORDER — ACETAMINOPHEN 500 MG PO TABS
1000.0000 mg | ORAL_TABLET | Freq: Once | ORAL | Status: AC
  Administered 2022-06-30: 1000 mg via ORAL
  Filled 2022-06-30: qty 2

## 2022-06-30 MED ORDER — DULOXETINE HCL 30 MG PO CPEP
60.0000 mg | ORAL_CAPSULE | Freq: Every day | ORAL | Status: DC
  Administered 2022-06-30: 60 mg via ORAL
  Filled 2022-06-30: qty 2

## 2022-06-30 MED ORDER — LIDOCAINE 2% (20 MG/ML) 5 ML SYRINGE
INTRAMUSCULAR | Status: AC
  Filled 2022-06-30: qty 5

## 2022-06-30 MED ORDER — MIDAZOLAM HCL 2 MG/2ML IJ SOLN
INTRAMUSCULAR | Status: DC | PRN
  Administered 2022-06-30: 2 mg via INTRAVENOUS

## 2022-06-30 MED ORDER — ONDANSETRON HCL 4 MG/2ML IJ SOLN
INTRAMUSCULAR | Status: AC
  Filled 2022-06-30: qty 2

## 2022-06-30 MED ORDER — FENTANYL CITRATE (PF) 100 MCG/2ML IJ SOLN
25.0000 ug | INTRAMUSCULAR | Status: DC | PRN
  Administered 2022-06-30 (×2): 50 ug via INTRAVENOUS

## 2022-06-30 MED ORDER — DIPHENHYDRAMINE HCL 50 MG/ML IJ SOLN
INTRAMUSCULAR | Status: DC | PRN
  Administered 2022-06-30: 12.5 mg via INTRAVENOUS

## 2022-06-30 MED ORDER — FENTANYL CITRATE (PF) 100 MCG/2ML IJ SOLN
INTRAMUSCULAR | Status: AC
  Filled 2022-06-30: qty 2

## 2022-06-30 MED ORDER — OXYCODONE-ACETAMINOPHEN 5-325 MG PO TABS
1.0000 | ORAL_TABLET | ORAL | Status: DC | PRN
  Administered 2022-06-30 – 2022-07-01 (×4): 1 via ORAL
  Filled 2022-06-30 (×4): qty 1

## 2022-06-30 MED ORDER — PROPOFOL 1000 MG/100ML IV EMUL
INTRAVENOUS | Status: AC
  Filled 2022-06-30: qty 300

## 2022-06-30 MED ORDER — ACETAMINOPHEN 650 MG RE SUPP: 650.0000 mg | RECTAL | Status: DC | PRN

## 2022-06-30 MED ORDER — FENTANYL CITRATE (PF) 250 MCG/5ML IJ SOLN
INTRAMUSCULAR | Status: AC
  Filled 2022-06-30: qty 5

## 2022-06-30 MED ORDER — ROCURONIUM BROMIDE 10 MG/ML (PF) SYRINGE
PREFILLED_SYRINGE | INTRAVENOUS | Status: DC | PRN
  Administered 2022-06-30: 60 mg via INTRAVENOUS
  Administered 2022-06-30: 20 mg via INTRAVENOUS

## 2022-06-30 MED ORDER — BACITRACIN ZINC 500 UNIT/GM EX OINT
TOPICAL_OINTMENT | CUTANEOUS | Status: AC
  Filled 2022-06-30: qty 28.35

## 2022-06-30 MED ORDER — FENTANYL CITRATE (PF) 100 MCG/2ML IJ SOLN
25.0000 ug | INTRAMUSCULAR | Status: DC | PRN
  Administered 2022-06-30 (×2): 25 ug via INTRAVENOUS
  Administered 2022-06-30 (×2): 50 ug via INTRAVENOUS

## 2022-06-30 MED ORDER — SODIUM CHLORIDE 0.9% FLUSH
3.0000 mL | Freq: Two times a day (BID) | INTRAVENOUS | Status: DC
  Administered 2022-06-30: 3 mL via INTRAVENOUS

## 2022-06-30 MED ORDER — DEXAMETHASONE SODIUM PHOSPHATE 10 MG/ML IJ SOLN
INTRAMUSCULAR | Status: AC
  Filled 2022-06-30: qty 1

## 2022-06-30 MED ORDER — OXYCODONE HCL 5 MG PO TABS
5.0000 mg | ORAL_TABLET | ORAL | Status: DC | PRN
  Administered 2022-06-30 – 2022-07-01 (×4): 5 mg via ORAL
  Filled 2022-06-30 (×4): qty 1

## 2022-06-30 MED ORDER — CEFAZOLIN SODIUM-DEXTROSE 2-4 GM/100ML-% IV SOLN
2.0000 g | Freq: Three times a day (TID) | INTRAVENOUS | Status: AC
  Administered 2022-06-30 – 2022-07-01 (×2): 2 g via INTRAVENOUS
  Filled 2022-06-30 (×2): qty 100

## 2022-06-30 MED ORDER — ACETAMINOPHEN 500 MG PO TABS: 1000.0000 mg | ORAL_TABLET | ORAL | Status: DC

## 2022-06-30 MED ORDER — SUGAMMADEX SODIUM 200 MG/2ML IV SOLN
INTRAVENOUS | Status: DC | PRN
  Administered 2022-06-30 (×2): 200 mg via INTRAVENOUS

## 2022-06-30 MED ORDER — OXYCODONE-ACETAMINOPHEN 10-325 MG PO TABS: 1.0000 | ORAL_TABLET | ORAL | Status: DC | PRN

## 2022-06-30 MED ORDER — 0.9 % SODIUM CHLORIDE (POUR BTL) OPTIME
TOPICAL | Status: DC | PRN
  Administered 2022-06-30: 1000 mL

## 2022-06-30 MED ORDER — KETAMINE HCL 50 MG/5ML IJ SOSY
PREFILLED_SYRINGE | INTRAMUSCULAR | Status: AC
  Filled 2022-06-30: qty 5

## 2022-06-30 MED ORDER — SODIUM CHLORIDE 0.9% FLUSH: 3.0000 mL | INTRAVENOUS | Status: DC | PRN

## 2022-06-30 MED ORDER — CEFAZOLIN SODIUM-DEXTROSE 2-4 GM/100ML-% IV SOLN
2.0000 g | INTRAVENOUS | Status: AC
  Administered 2022-06-30: 2 g via INTRAVENOUS
  Filled 2022-06-30: qty 100

## 2022-06-30 MED ORDER — ORAL CARE MOUTH RINSE: 15.0000 mL | Freq: Once | OROMUCOSAL | Status: AC

## 2022-06-30 MED ORDER — CHLORHEXIDINE GLUCONATE CLOTH 2 % EX PADS: 6.0000 | MEDICATED_PAD | Freq: Once | CUTANEOUS | Status: DC

## 2022-06-30 MED ORDER — PANTOPRAZOLE SODIUM 40 MG PO TBEC
40.0000 mg | DELAYED_RELEASE_TABLET | Freq: Two times a day (BID) | ORAL | Status: DC
  Administered 2022-06-30: 40 mg via ORAL
  Filled 2022-06-30: qty 1

## 2022-06-30 MED ORDER — KETAMINE HCL 10 MG/ML IJ SOLN
INTRAMUSCULAR | Status: DC | PRN
  Administered 2022-06-30: 20 mg via INTRAVENOUS
  Administered 2022-06-30: 10 mg via INTRAVENOUS

## 2022-06-30 MED ORDER — GLYCOPYRROLATE 1 MG PO TABS
2.0000 mg | ORAL_TABLET | Freq: Two times a day (BID) | ORAL | Status: DC
  Filled 2022-06-30 (×2): qty 2

## 2022-06-30 MED ORDER — ROCURONIUM BROMIDE 10 MG/ML (PF) SYRINGE
PREFILLED_SYRINGE | INTRAVENOUS | Status: AC
  Filled 2022-06-30: qty 10

## 2022-06-30 MED ORDER — MORPHINE SULFATE (PF) 2 MG/ML IV SOLN: 2.0000 mg | INTRAVENOUS | Status: DC | PRN

## 2022-06-30 MED ORDER — CHLORHEXIDINE GLUCONATE 0.12 % MT SOLN
15.0000 mL | Freq: Once | OROMUCOSAL | Status: AC
  Administered 2022-06-30: 15 mL via OROMUCOSAL
  Filled 2022-06-30: qty 15

## 2022-06-30 MED ORDER — BUPIVACAINE HCL (PF) 0.25 % IJ SOLN
INTRAMUSCULAR | Status: AC
  Filled 2022-06-30: qty 30

## 2022-06-30 MED ORDER — MIDAZOLAM HCL 2 MG/2ML IJ SOLN
INTRAMUSCULAR | Status: AC
  Filled 2022-06-30: qty 2

## 2022-06-30 MED ORDER — THROMBIN 20000 UNITS EX SOLR: CUTANEOUS | Status: DC | PRN

## 2022-06-30 MED ORDER — LACTATED RINGERS IV SOLN: INTRAVENOUS | Status: DC

## 2022-06-30 MED ORDER — SODIUM CHLORIDE 0.9 % IV SOLN: 250.0000 mL | INTRAVENOUS | Status: DC

## 2022-06-30 MED ORDER — THROMBIN 5000 UNITS EX SOLR
CUTANEOUS | Status: AC
  Filled 2022-06-30: qty 5000

## 2022-06-30 MED ORDER — SENNA 8.6 MG PO TABS
1.0000 | ORAL_TABLET | Freq: Two times a day (BID) | ORAL | Status: DC
  Administered 2022-06-30: 8.6 mg via ORAL
  Filled 2022-06-30: qty 1

## 2022-06-30 MED ORDER — CELECOXIB 200 MG PO CAPS
200.0000 mg | ORAL_CAPSULE | Freq: Two times a day (BID) | ORAL | Status: DC
  Administered 2022-06-30: 200 mg via ORAL
  Filled 2022-06-30: qty 1

## 2022-06-30 MED ORDER — ONDANSETRON HCL 4 MG/2ML IJ SOLN: 4.0000 mg | Freq: Four times a day (QID) | INTRAMUSCULAR | Status: DC | PRN

## 2022-06-30 MED ORDER — POTASSIUM CHLORIDE CRYS ER 10 MEQ PO TBCR
10.0000 meq | EXTENDED_RELEASE_TABLET | Freq: Two times a day (BID) | ORAL | Status: DC
  Administered 2022-06-30: 10 meq via ORAL
  Filled 2022-06-30: qty 1

## 2022-06-30 MED ORDER — HYDRALAZINE HCL 20 MG/ML IJ SOLN
INTRAMUSCULAR | Status: AC
  Filled 2022-06-30: qty 1

## 2022-06-30 MED ORDER — MENTHOL 3 MG MT LOZG: 1.0000 | LOZENGE | OROMUCOSAL | Status: DC | PRN

## 2022-06-30 MED ORDER — ONDANSETRON HCL 4 MG/2ML IJ SOLN
INTRAMUSCULAR | Status: DC | PRN
  Administered 2022-06-30: 4 mg via INTRAVENOUS

## 2022-06-30 MED ORDER — METHOCARBAMOL 500 MG PO TABS
500.0000 mg | ORAL_TABLET | Freq: Three times a day (TID) | ORAL | Status: DC | PRN
  Administered 2022-06-30 – 2022-07-01 (×2): 500 mg via ORAL
  Filled 2022-06-30 (×2): qty 1

## 2022-06-30 MED ORDER — SURGIPHOR WOUND IRRIGATION SYSTEM - OPTIME: TOPICAL | Status: DC | PRN

## 2022-06-30 MED ORDER — HYDRALAZINE HCL 20 MG/ML IJ SOLN
5.0000 mg | Freq: Once | INTRAMUSCULAR | Status: AC
  Administered 2022-06-30: 5 mg via INTRAVENOUS

## 2022-06-30 MED ORDER — BUPIVACAINE HCL (PF) 0.25 % IJ SOLN
INTRAMUSCULAR | Status: DC | PRN
  Administered 2022-06-30: 7 mL

## 2022-06-30 MED ORDER — PROPOFOL 10 MG/ML IV BOLUS
INTRAVENOUS | Status: DC | PRN
  Administered 2022-06-30: 50 mg via INTRAVENOUS
  Administered 2022-06-30: 120 mg via INTRAVENOUS
  Administered 2022-06-30: 30 mg via INTRAVENOUS

## 2022-06-30 MED ORDER — PHENYLEPHRINE 80 MCG/ML (10ML) SYRINGE FOR IV PUSH (FOR BLOOD PRESSURE SUPPORT)
PREFILLED_SYRINGE | INTRAVENOUS | Status: AC
  Filled 2022-06-30: qty 10

## 2022-06-30 MED ORDER — FENTANYL CITRATE (PF) 250 MCG/5ML IJ SOLN
INTRAMUSCULAR | Status: DC | PRN
  Administered 2022-06-30 (×2): 50 ug via INTRAVENOUS
  Administered 2022-06-30: 100 ug via INTRAVENOUS
  Administered 2022-06-30: 50 ug via INTRAVENOUS

## 2022-06-30 MED ORDER — ACETAMINOPHEN 325 MG PO TABS: 650.0000 mg | ORAL_TABLET | ORAL | Status: DC | PRN

## 2022-06-30 MED ORDER — THROMBIN 20000 UNITS EX SOLR
CUTANEOUS | Status: AC
  Filled 2022-06-30: qty 20000

## 2022-06-30 MED ORDER — ONDANSETRON HCL 4 MG PO TABS: 4.0000 mg | ORAL_TABLET | Freq: Four times a day (QID) | ORAL | Status: DC | PRN

## 2022-06-30 MED ORDER — BACITRACIN ZINC 500 UNIT/GM EX OINT
TOPICAL_OINTMENT | CUTANEOUS | Status: DC | PRN
  Administered 2022-06-30: 1 via TOPICAL

## 2022-06-30 MED ORDER — PROPOFOL 500 MG/50ML IV EMUL
INTRAVENOUS | Status: DC | PRN
  Administered 2022-06-30: 125 ug/kg/min via INTRAVENOUS
  Administered 2022-06-30: 150 ug/kg/min via INTRAVENOUS

## 2022-06-30 MED ORDER — GABAPENTIN 300 MG PO CAPS
300.0000 mg | ORAL_CAPSULE | ORAL | Status: AC
  Administered 2022-06-30: 300 mg via ORAL
  Filled 2022-06-30: qty 1

## 2022-06-30 MED ORDER — THROMBIN 5000 UNITS EX SOLR: OROMUCOSAL | Status: DC | PRN

## 2022-06-30 MED ORDER — CELECOXIB 200 MG PO CAPS
200.0000 mg | ORAL_CAPSULE | Freq: Once | ORAL | Status: AC
  Administered 2022-06-30: 200 mg via ORAL
  Filled 2022-06-30: qty 1

## 2022-06-30 SURGICAL SUPPLY — 59 items
APL SKNCLS STERI-STRIP NONHPOA (GAUZE/BANDAGES/DRESSINGS) ×1
BAG COUNTER SPONGE SURGICOUNT (BAG) ×2 IMPLANT
BAG SPNG CNTER NS LX DISP (BAG) ×1
BENZOIN TINCTURE PRP APPL 2/3 (GAUZE/BANDAGES/DRESSINGS) ×2 IMPLANT
BIT DRILL INVICTUS SUB 2.1 STR (BIT) IMPLANT
BLADE CLIPPER SURG (BLADE) IMPLANT
BONE FIBERS PLIAFX 5ML (Bone Implant) ×1 IMPLANT
BUR CARBIDE MATCH 3.0 (BURR) ×2 IMPLANT
CANISTER SUCT 3000ML PPV (MISCELLANEOUS) ×2 IMPLANT
DRAPE C-ARM 42X72 X-RAY (DRAPES) ×4 IMPLANT
DRAPE LAPAROTOMY 100X72 PEDS (DRAPES) ×2 IMPLANT
DRSG OPSITE POSTOP 4X6 (GAUZE/BANDAGES/DRESSINGS) IMPLANT
DURAPREP 6ML APPLICATOR 50/CS (WOUND CARE) ×2 IMPLANT
ELECT REM PT RETURN 9FT ADLT (ELECTROSURGICAL) ×1
ELECTRODE REM PT RTRN 9FT ADLT (ELECTROSURGICAL) ×2 IMPLANT
EVACUATOR 1/8 PVC DRAIN (DRAIN) IMPLANT
GAUZE 4X4 16PLY ~~LOC~~+RFID DBL (SPONGE) IMPLANT
GAUZE SPONGE 4X4 12PLY STRL (GAUZE/BANDAGES/DRESSINGS) ×2 IMPLANT
GLOVE BIO SURGEON STRL SZ7 (GLOVE) IMPLANT
GLOVE BIO SURGEON STRL SZ8 (GLOVE) ×2 IMPLANT
GLOVE BIOGEL PI IND STRL 7.0 (GLOVE) IMPLANT
GLOVE EXAM NITRILE XL STR (GLOVE) IMPLANT
GOWN STRL REUS W/ TWL LRG LVL3 (GOWN DISPOSABLE) IMPLANT
GOWN STRL REUS W/ TWL XL LVL3 (GOWN DISPOSABLE) ×2 IMPLANT
GOWN STRL REUS W/TWL 2XL LVL3 (GOWN DISPOSABLE) IMPLANT
GOWN STRL REUS W/TWL LRG LVL3 (GOWN DISPOSABLE)
GOWN STRL REUS W/TWL XL LVL3 (GOWN DISPOSABLE) ×1
GRAFT BNE FBR PLIAFX PRIME 5 (Bone Implant) IMPLANT
HEMOSTAT POWDER KIT SURGIFOAM (HEMOSTASIS) IMPLANT
KIT BASIN OR (CUSTOM PROCEDURE TRAY) ×2 IMPLANT
KIT TURNOVER KIT B (KITS) ×2 IMPLANT
MARKER SKIN DUAL TIP RULER LAB (MISCELLANEOUS) ×2 IMPLANT
NDL HYPO 18GX1.5 BLUNT FILL (NEEDLE) IMPLANT
NDL HYPO 25X1 1.5 SAFETY (NEEDLE) ×2 IMPLANT
NDL SPNL 20GX3.5 QUINCKE YW (NEEDLE) ×2 IMPLANT
NEEDLE HYPO 18GX1.5 BLUNT FILL (NEEDLE) IMPLANT
NEEDLE HYPO 25X1 1.5 SAFETY (NEEDLE) ×1 IMPLANT
NEEDLE SPNL 20GX3.5 QUINCKE YW (NEEDLE) ×1 IMPLANT
NS IRRIG 1000ML POUR BTL (IV SOLUTION) ×2 IMPLANT
PACK LAMINECTOMY NEURO (CUSTOM PROCEDURE TRAY) ×2 IMPLANT
PIN MAYFIELD SKULL DISP (PIN) ×2 IMPLANT
PUTTY DBM 5CC (Putty) IMPLANT
ROD LORD INV 3.5X70 (Rod) IMPLANT
SCREW POLYAXIAL 3.5 X 14 (Screw) IMPLANT
SCREW SET ATEC (Screw) IMPLANT
SOL ELECTROSURG ANTI STICK (MISCELLANEOUS)
SOLUTION ELECTROSURG ANTI STCK (MISCELLANEOUS) ×2 IMPLANT
SOLUTION IRRIG SURGIPHOR (IV SOLUTION) IMPLANT
SPONGE SURGIFOAM ABS GEL 100 (HEMOSTASIS) ×2 IMPLANT
STRIP CLOSURE SKIN 1/2X4 (GAUZE/BANDAGES/DRESSINGS) ×2 IMPLANT
SUT VIC AB 0 CT1 18XCR BRD8 (SUTURE) ×2 IMPLANT
SUT VIC AB 0 CT1 8-18 (SUTURE) ×1
SUT VIC AB 2-0 CP2 18 (SUTURE) ×2 IMPLANT
SUT VIC AB 3-0 SH 8-18 (SUTURE) IMPLANT
TOWEL GREEN STERILE (TOWEL DISPOSABLE) ×2 IMPLANT
TOWEL GREEN STERILE FF (TOWEL DISPOSABLE) ×2 IMPLANT
TRAY FOLEY MTR SLVR 16FR STAT (SET/KITS/TRAYS/PACK) IMPLANT
UNDERPAD 30X36 HEAVY ABSORB (UNDERPADS AND DIAPERS) ×2 IMPLANT
WATER STERILE IRR 1000ML POUR (IV SOLUTION) ×2 IMPLANT

## 2022-06-30 NOTE — Anesthesia Procedure Notes (Signed)
Procedure Name: Intubation Date/Time: 06/30/2022 3:28 PM  Performed by: Randon Goldsmith, CRNAPre-anesthesia Checklist: Patient identified, Emergency Drugs available, Suction available and Patient being monitored Patient Re-evaluated:Patient Re-evaluated prior to induction Oxygen Delivery Method: Circle system utilized Preoxygenation: Pre-oxygenation with 100% oxygen Induction Type: IV induction Ventilation: Mask ventilation without difficulty Laryngoscope Size: Glidescope and 3 Grade View: Grade I Tube type: Oral Tube size: 7.0 mm Number of attempts: 1 Airway Equipment and Method: Stylet and Oral airway Placement Confirmation: ETT inserted through vocal cords under direct vision, positive ETCO2 and breath sounds checked- equal and bilateral Secured at: 20 cm Tube secured with: Tape Dental Injury: Teeth and Oropharynx as per pre-operative assessment

## 2022-06-30 NOTE — Op Note (Signed)
06/30/2022  5:06 PM  PATIENT:  [name]  72 y.o. 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  BLOOD ADMINISTERED: none  DRAINS: None  SPECIMEN:  none  INDICATION FOR PROCEDURE: This patient presented with neck pain with radiculopathy. Imaging showed pseudoarthrosis C3-4 and C6-7. The patient tried conservative measures without relief. Pain was debilitating. Recommended posterior cervical arthrodesis with lateral mass fixation C3-C7. Patient understood the risks, benefits, and alternatives and potential outcomes and wished to proceed.  PROCEDURE DETAILS: The patient was brought to the operating room. Generalized endotracheal anesthesia was induced. The patient was affixed a 3 point Mayfield headrest and rolled into the prone position on chest rolls. All pressure points were padded. The posterior cervical region was cleaned and prepped with DuraPrep and then draped in the usual sterile fashion. 7 cc of local anesthesia was injected and a dorsal midline incision made in the posterior cervical region and carried down to the cervical fascia. The fascia was opened and the paraspinous musculature was taken down to expose C3 to C7. Intraoperative fluoroscopy confirmed my level and then the dissection was carried out over the lateral facets. I localized the midpoint of each lateral mass and marked a region 1 mm medial to the midpoint of the lateral mass, and then drilled in an upward and outward direction into the safe zone of each lateral mass. I drilled to a depth of 12 mm and then checked my drill hole  with a ball probe. I then placed a 14 mm lateral mass screws into the safe zone of each lateral mass C3-C7 inclusive until they were 2 fingers tight. I then decorticated the lateral masses and the facet joints and packed them with local autograft and morcellized allograft to perform arthrodesis from C3-C7. I then placed rods into the multiaxial screw heads of the screws and locked these into position with the locking caps and anti-torque device. I then checked the final construct with AP/Lat fluoroscopy. I irrigated with 0.5% povidone iodine solution followed by saline solution. After hemostasis was achieved I closed the muscle and the fascia with 0 Vicryl, subcutaneous tissue with 2-0 Vicryl, and the subcuticular tissue with 3-0 Vicryl. The skin was closed with benzoin and Steri-Strips. A sterile dressing was applied, the patient was turned to the supine position and taken out of the headrest, awakened from general anesthesia and transferred to the recovery room in stable condition. At the end of the procedure all sponge, needle and instrument counts were correct.  My nurse practitioner was scrubbed for the entirety of the case and helped with the exposure, the placement of the lateral mass screws, and the arthrodesis.   PLAN OF CARE: Admit to inpatient   PATIENT DISPOSITION:  PACU - hemodynamically stable.   Delay start of Pharmacological VTE agent (>24hrs) due to surgical blood loss or risk of bleeding:  yes

## 2022-06-30 NOTE — Transfer of Care (Signed)
Immediate Anesthesia Transfer of Care Note  Patient: [name]  Procedure(s) Performed: Posterior cervical fusion with lateral mass fixation Cervical three-cervical seven (Neck)  Patient Location: PACU  Anesthesia Type:General  Level of Consciousness: awake and oriented  Airway & Oxygen Therapy: Patient Spontanous Breathing and Patient connected to face mask oxygen  Post-op Assessment: Report given to RN and Post -op Vital signs reviewed and stable  Post vital signs: stable  Last Vitals:  Vitals Value Taken Time  BP 145/82 06/30/22 1730  Temp 36.5 C 06/30/22 1722  Pulse 63 06/30/22 1733  Resp 14 06/30/22 1733  SpO2 92 % 06/30/22 1733  Vitals shown include unvalidated device data.  Last Pain:  Vitals:   06/30/22 1218  TempSrc: Oral         Complications: There were no known notable events for this encounter.

## 2022-06-30 NOTE — Anesthesia Preprocedure Evaluation (Addendum)
Anesthesia Evaluation  Patient identified by MRN, date of birth, ID band Patient awake    Reviewed: Allergy & Precautions, NPO status , Patient's Chart, lab work & pertinent test results  History of Anesthesia Complications (+) PONV and history of anesthetic complications  Airway Mallampati: II  TM Distance: >3 FB Neck ROM: Full    Dental no notable dental hx.    Pulmonary former smoker   Pulmonary exam normal        Cardiovascular  Rhythm:Regular Rate:Normal     Neuro/Psych    GI/Hepatic Neg liver ROS, hiatal hernia,GERD  Medicated,,  Endo/Other  negative endocrine ROS    Renal/GU negative Renal ROS     Musculoskeletal  (+) Arthritis ,  Fibromyalgia -  Abdominal Normal abdominal exam  (+)   Peds  Hematology  (+) Blood dyscrasia, anemia   Anesthesia Other Findings   Reproductive/Obstetrics                             Anesthesia Physical Anesthesia Plan  ASA: 3  Anesthesia Plan: General   Post-op Pain Management: Celebrex PO (pre-op)*, Ketamine IV* and Tylenol PO (pre-op)*   Induction: Intravenous  PONV Risk Score and Plan: 3 and Ondansetron, Dexamethasone, Midazolam, Propofol infusion, TIVA and Amisulpride  Airway Management Planned: Mask, Oral ETT and Video Laryngoscope Planned  Additional Equipment: None  Intra-op Plan:   Post-operative Plan: Extubation in OR  Informed Consent: I have reviewed the patients History and Physical, chart, labs and discussed the procedure including the risks, benefits and alternatives for the proposed anesthesia with the patient or authorized representative who has indicated his/her understanding and acceptance.     Dental advisory given  Plan Discussed with: CRNA  Anesthesia Plan Comments:        Anesthesia Quick Evaluation

## 2022-06-30 NOTE — H&P (Signed)
Subjective:   Patient is a 72 y.o. [name] admitted for cervical pseudoarthrosis. The patient first presented to me with complaints of neck pain and shooting pains in the arm(s). Onset of symptoms was a few months ago. The pain is described as aching and occurs all day. The pain is rated severe, and is located in the neck and radiates to the shoulders. The symptoms have been progressive. Symptoms are exacerbated by extending head backwards, and are relieved by none.  Previous work up includes CT of cervical spine, results: Pseudoarthrosis of the spine at C6-7 and possibly at C3-4.  Past Medical History:  Diagnosis Date   Anemia    Anxiety 2000   Arthritis    Depression 2000   Family history of adverse reaction to anesthesia    mother and sister have nausea after surgery   Fibromyalgia    GERD (gastroesophageal reflux disease)    Headache(784.0)    Pneumonia    PONV (postoperative nausea and vomiting)    Seasonal allergies     Past Surgical History:  Procedure Laterality Date   ABDOMINAL HYSTERECTOMY  1984   ANTERIOR CERVICAL DECOMP/DISCECTOMY FUSION N/A 10/16/2021   Procedure: Anterior Cervical Discectomy and Fusion Cervical Three-Four;  Surgeon: Tia Alert, MD;  Location: [facility] OR;  Service: Neurosurgery;  Laterality: N/A;  3C   BACK SURGERY  2006, 2008   lower back, fusion in l5-s1   CHOLECYSTECTOMY  2003   ERCP W/ SPHINCTEROTOMY AND BALLOON DILATION  2005   pancreatitis    LUMBAR LAMINECTOMY/DECOMPRESSION MICRODISCECTOMY N/A 05/31/2012   Procedure: MICRO LUMBAR DECOMPRESSION L4 - L5, L5 - S1 ;  Surgeon: Javier Docker, MD;  Location: [facility] ORS;  Service: Orthopedics;  Laterality: N/A;   LUMBAR LAMINECTOMY/DECOMPRESSION MICRODISCECTOMY Right 02/06/2014   Procedure: LUMBAR DECOMPRESSION L3-L4, REDO LUMBAR DECOMPRESSION L4-L5 ON THE RIGHT;  Surgeon: Javier Docker, MD;  Location: [facility] ORS;  Service: Orthopedics;  Laterality: Right;   NECK SURGERY  2001,2007   herniated disc- cervical  fusion(limited side to side mobility)   SHOULDER ARTHROSCOPY Bilateral 2010, 2011   debridement    No Known Allergies  Social History   Tobacco Use   Smoking status: Former    Packs/day: 0.50    Years: 3.00    Additional pack years: 0.00    Total pack years: 1.50    Types: Cigarettes    Quit date: 02/02/1972    Years since quitting: 50.4   Smokeless tobacco: Never  Substance Use Topics   Alcohol use: No    History reviewed. No pertinent family history. Prior to Admission medications   Medication Sig Start Date End Date Taking? Authorizing Provider  Ascorbic Acid (VITAMIN C) 1000 MG tablet Take 1,000 mg by mouth daily.   Yes [provider]  Calcium Carb-Cholecalciferol (CALCIUM 600 + D PO) Take 1 tablet by mouth daily.   Yes [provider]  cetirizine (ZYRTEC) 10 MG tablet Take 10 mg by mouth daily.   Yes [provider]  Cholecalciferol (VITAMIN D) 125 MCG (5000 UT) CAPS Take 5,000 Units by mouth 2 (two) times daily.   Yes [provider]  docusate sodium (COLACE) 100 MG capsule Take 1 capsule (100 mg total) by mouth 2 (two) times daily as needed for mild constipation. Patient taking differently: Take 100 mg by mouth daily. 02/06/14  Yes Jene Every, MD  DULoxetine (CYMBALTA) 60 MG capsule Take 60 mg by mouth daily. 06/11/22  Yes [provider]  ferrous sulfate ER (  SLOW FE) 142 (45 Fe) MG TBCR tablet Take 45 mg by mouth daily.   Yes [provider]  Ginkgo Biloba 60 MG TABS Take 60 mg by mouth in the morning and at bedtime.   Yes [provider]  glycopyrrolate (ROBINUL) 2 MG tablet Take 2 mg by mouth 2 (two) times daily.   Yes [provider]  magnesium gluconate (MAGONATE) 500 MG tablet Take 500 mg by mouth daily.   Yes [provider]  methocarbamol (ROBAXIN) 500 MG tablet Take 500 mg by mouth 3 (three) times daily as needed for muscle spasms.   Yes [provider]  Multiple  Vitamins-Minerals (MULTIVITAMIN PO) Take 1 tablet by mouth daily.   Yes [provider]  naproxen sodium (ALEVE) 220 MG tablet Take 220 mg by mouth daily as needed (pain).   Yes [provider]  Omega-3 Fatty Acids (FISH OIL PO) Take 1 capsule by mouth daily.   Yes [provider]  ondansetron (ZOFRAN) 4 MG tablet Take 4 mg by mouth every 6 (six) hours as needed for nausea/vomiting. 05/02/21  Yes [provider]  oxyCODONE-acetaminophen (PERCOCET) 10-325 MG tablet Take 1 tablet by mouth every 4 (four) hours as needed for pain. 06/18/20  Yes Tia Alert, MD  pantoprazole (PROTONIX) 40 MG tablet Take 40 mg by mouth 2 (two) times daily.    Yes [provider]  Polyethyl Glycol-Propyl Glycol (SYSTANE ULTRA OP) Place 1 drop into both eyes 2 (two) times daily as needed (dry eyes).   Yes [provider]  polyethylene glycol (MIRALAX / GLYCOLAX) packet Take 17 g by mouth daily.   Yes [provider]  Potassium 99 MG TABS Take 99 mg by mouth 2 (two) times daily.   Yes [provider]  TURMERIC CURCUMIN PO Take 1,000 mg by mouth daily.   Yes [provider]  vitamin B-12 (CYANOCOBALAMIN) 1000 MCG tablet Take 1,000 mcg by mouth in the morning and at bedtime.   Yes [provider]  zinc gluconate 50 MG tablet Take 50 mg by mouth daily.   Yes [provider]  acyclovir (ZOVIRAX) 200 MG capsule Take 200 mg by mouth 5 (five) times daily as needed (fever blisters). 10/07/21   [provider]  lidocaine (LIDODERM) 5 % Place 1-3 patches onto the skin daily as needed (pain). Remove & Discard patch within 12 hours or as directed by MD    [provider]  sodium chloride (OCEAN) 0.65 % SOLN nasal spray Place 1 spray into both nostrils as needed for congestion.    [provider]     Review of Systems  Positive ROS: neg  All other systems have been reviewed and were otherwise negative with the  exception of those mentioned in the HPI and as above.  Objective: Vital signs in last 24 hours: Temp:  [98.3 F (36.8 C)] 98.3 F (36.8 C) (05/29 1218) Pulse Rate:  [78] 78 (05/29 1218) Resp:  [17] 17 (05/29 1218) BP: (150)/(69) 150/69 (05/29 1218) SpO2:  [98 %] 98 % (05/29 1218) Weight:  [70.3 kg] 70.3 kg (05/29 1218)  General Appearance: Alert, cooperative, no distress, appears stated age Head: Normocephalic, without obvious abnormality, atraumatic Eyes: PERRL, conjunctiva/corneas clear, EOM's intact      Neck: Supple, symmetrical, trachea midline, Back: Symmetric, no curvature, ROM normal, no CVA tenderness Lungs:  respirations unlabored Heart: Regular rate and rhythm Abdomen: Soft, non-tender Extremities: Extremities normal, atraumatic, no cyanosis or edema Pulses: 2+  and symmetric all extremities Skin: Skin color, texture, turgor normal, no rashes or lesions  NEUROLOGIC:  Mental status: Alert and oriented x4, no aphasia, good attention span, fund of knowledge and memory  Motor Exam - grossly normal Sensory Exam - grossly normal Reflexes: 1+ Coordination - grossly normal Gait - grossly normal Balance - grossly normal Cranial Nerves: I: smell Not tested  II: visual acuity  OS: nl    OD: nl  II: visual fields Full to confrontation  II: pupils Equal, round, reactive to light  III,VII: ptosis None  III,IV,VI: extraocular muscles  Full ROM  V: mastication Normal  V: facial light touch sensation  Normal  V,VII: corneal reflex  Present  VII: facial muscle function - upper  Normal  VII: facial muscle function - lower Normal  VIII: hearing Not tested  IX: soft palate elevation  Normal  IX,X: gag reflex Present  XI: trapezius strength  5/5  XI: sternocleidomastoid strength 5/5  XI: neck flexion strength  5/5  XII: tongue strength  Normal    Data Review Lab Results  Component Value Date   WBC 5.8 06/23/2022   HGB 12.9 06/23/2022   HCT 39.9 06/23/2022   MCV 97.1  06/23/2022   PLT 304 06/23/2022   Lab Results  Component Value Date   NA 135 06/23/2022   K 4.1 06/23/2022   CL 97 (L) 06/23/2022   CO2 29 06/23/2022   BUN 10 06/23/2022   CREATININE 0.89 06/23/2022   GLUCOSE 102 (H) 06/23/2022   Lab Results  Component Value Date   INR 0.9 06/23/2022    Assessment:   Cervical neck pain with herniated nucleus pulposus/ spondylosis/ stenosis at C3-4 and C6-7. Estimated body mass index is 25.79 kg/m as calculated from the following:   Height as of this encounter: 5\' 5"  (1.651 m).   Weight as of this encounter: 70.3 kg.  Patient has failed conservative therapy. Planned surgery : PCF with lateral mass instrumentation C3-C7  Plan:   I explained the condition and procedure to the patient and answered any questions.  Patient wishes to proceed with procedure as planned. Understands risks/ benefits/ and expected or typical outcomes.  Tia Alert 06/30/2022 2:32 PM

## 2022-07-01 ENCOUNTER — Encounter (HOSPITAL_COMMUNITY): Payer: Self-pay | Admitting: Neurological Surgery

## 2022-07-01 NOTE — Progress Notes (Signed)
Patient alert and oriented,void, ambulate. D/c instructions explain and given to the patient all questions answered. Surgical site clean and dry no sign of infection. Pt. D/c home per order.

## 2022-07-01 NOTE — Discharge Summary (Signed)
Physician Discharge Summary  Patient ID: [name] MRN: [id number] DOB/AGE: 06/30/1950 72 y.o.  Admit date: 06/30/2022 Discharge date: 07/01/2022  Admission Diagnoses: cervical pseudoarthrosis    Discharge Diagnoses: same   Discharged Condition: good  Hospital Course: The patient was admitted on 06/30/2022 and taken to the operating room where the patient underwent PCF C3-7. The patient tolerated the procedure well and was taken to the recovery room and then to the floor in stable condition. The hospital course was routine. There were no complications. The wound remained clean dry and intact. Pt had appropriate neck soreness. No complaints of arm pain or new N/T/W. The patient remained afebrile with stable vital signs, and tolerated a regular diet. The patient continued to increase activities, and pain was well controlled with oral pain medications.   Consults: None  Significant Diagnostic Studies:  Results for orders placed or performed during the hospital encounter of 06/23/22  Surgical pcr screen   Specimen: Nasal Mucosa; Nasal Swab  Result Value Ref Range   MRSA, PCR NEGATIVE NEGATIVE   Staphylococcus aureus POSITIVE (A) NEGATIVE  Basic metabolic panel per protocol  Result Value Ref Range   Sodium 135 135 - 145 mmol/L   Potassium 4.1 3.5 - 5.1 mmol/L   Chloride 97 (L) 98 - 111 mmol/L   CO2 29 22 - 32 mmol/L   Glucose, Bld 102 (H) 70 - 99 mg/dL   BUN 10 8 - 23 mg/dL   Creatinine, Ser 4.09 0.44 - 1.00 mg/dL   Calcium 9.4 8.9 - 81.1 mg/dL   GFR, Estimated >91 >47 mL/min   Anion gap 9 5 - 15  CBC per protocol  Result Value Ref Range   WBC 5.8 4.0 - 10.5 K/uL   RBC 4.11 3.87 - 5.11 MIL/uL   Hemoglobin 12.9 12.0 - 15.0 g/dL   HCT 82.9 56.2 - 13.0 %   MCV 97.1 80.0 - 100.0 fL   MCH 31.4 26.0 - 34.0 pg   MCHC 32.3 30.0 - 36.0 g/dL   RDW 86.5 78.4 - 69.6 %   Platelets 304 150 - 400 K/uL   nRBC 0.0 0.0 - 0.2 %  Protime-INR  Result Value Ref Range   Prothrombin Time  12.4 11.4 - 15.2 seconds   INR 0.9 0.8 - 1.2  Type and screen [facility]  Result Value Ref Range   ABO/RH(D) A POS    Antibody Screen NEG    Sample Expiration 07/07/2022,2359    Extend sample reason      NO TRANSFUSIONS OR PREGNANCY IN THE PAST 3 MONTHS Performed at [facility], [address] [place], [place] [address]     DG Cervical Spine 2 or 3 views  Result Date: 06/30/2022 CLINICAL DATA:  Elective surgery. EXAM: CERVICAL SPINE - 2-3 VIEW COMPARISON:  Radiograph 06/10/2022 FINDINGS: Four fluoroscopic spot views of the cervical spine obtained in the operating room. Prior anterior fusion hardware is in place. There is new posterior fusion from C3 through C7. Fluoroscopy time 43 seconds. Dose 5.97 mGy. IMPRESSION: Intraoperative fluoroscopy during posterior fusion. Electronically Signed   By: Narda Rutherford M.D.   On: 06/30/2022 18:54   DG C-Arm 1-60 Min-No Report  Result Date: 06/30/2022 Fluoroscopy was utilized by the requesting physician.  No radiographic interpretation.    Antibiotics:  Anti-infectives (From admission, onward)    Start     Dose/Rate Route Frequency Ordered Stop   07/01/22 0600  ceFAZolin (ANCEF) IVPB 2g/100 mL premix  2 g 200 mL/hr over 30 Minutes Intravenous On call to O.R. 06/30/22 1219 07/01/22 0624   06/30/22 2300  ceFAZolin (ANCEF) IVPB 2g/100 mL premix        2 g 200 mL/hr over 30 Minutes Intravenous Every 8 hours 06/30/22 1939 07/01/22 0623       Discharge Exam: Blood pressure (!) 159/75, pulse 87, temperature 97.9 F (36.6 C), temperature source Oral, resp. rate 16, height 5\' 5"  (1.651 m), weight 70.3 kg, SpO2 100 %. Neurologic: Grossly normal Dressing dry  Discharge Medications:   Allergies as of 07/01/2022   No Known Allergies      Medication List     TAKE these medications    acyclovir 200 MG capsule Commonly known as: ZOVIRAX Take 200 mg by mouth 5 (five) times daily as needed (fever blisters).    CALCIUM 600 + D PO Take 1 tablet by mouth daily.   cetirizine 10 MG tablet Commonly known as: ZYRTEC Take 10 mg by mouth daily.   cyanocobalamin 1000 MCG tablet Commonly known as: VITAMIN B12 Take 1,000 mcg by mouth in the morning and at bedtime.   docusate sodium 100 MG capsule Commonly known as: Colace Take 1 capsule (100 mg total) by mouth 2 (two) times daily as needed for mild constipation. What changed: when to take this   DULoxetine 60 MG capsule Commonly known as: CYMBALTA Take 60 mg by mouth daily.   FISH OIL PO Take 1 capsule by mouth daily.   Ginkgo Biloba 60 MG Tabs Take 60 mg by mouth in the morning and at bedtime.   glycopyrrolate 2 MG tablet Commonly known as: ROBINUL Take 2 mg by mouth 2 (two) times daily.   lidocaine 5 % Commonly known as: LIDODERM Place 1-3 patches onto the skin daily as needed (pain). Remove & Discard patch within 12 hours or as directed by MD   magnesium gluconate 500 MG tablet Commonly known as: MAGONATE Take 500 mg by mouth daily.   methocarbamol 500 MG tablet Commonly known as: ROBAXIN Take 500 mg by mouth 3 (three) times daily as needed for muscle spasms.   MULTIVITAMIN PO Take 1 tablet by mouth daily.   naproxen sodium 220 MG tablet Commonly known as: ALEVE Take 220 mg by mouth daily as needed (pain).   ondansetron 4 MG tablet Commonly known as: ZOFRAN Take 4 mg by mouth every 6 (six) hours as needed for nausea/vomiting.   oxyCODONE-acetaminophen 10-325 MG tablet Commonly known as: PERCOCET Take 1 tablet by mouth every 4 (four) hours as needed for pain.   pantoprazole 40 MG tablet Commonly known as: PROTONIX Take 40 mg by mouth 2 (two) times daily.   polyethylene glycol 17 g packet Commonly known as: MIRALAX / GLYCOLAX Take 17 g by mouth daily.   Potassium 99 MG Tabs Take 99 mg by mouth 2 (two) times daily.   Slow Fe 142 (45 Fe) MG Tbcr tablet Generic drug: ferrous sulfate ER Take 45 mg by mouth daily.    sodium chloride 0.65 % Soln nasal spray Commonly known as: OCEAN Place 1 spray into both nostrils as needed for congestion.   SYSTANE ULTRA OP Place 1 drop into both eyes 2 (two) times daily as needed (dry eyes).   TURMERIC CURCUMIN PO Take 1,000 mg by mouth daily.   vitamin C 1000 MG tablet Take 1,000 mg by mouth daily.   Vitamin D 125 MCG (5000 UT) Caps Take 5,000 Units by mouth 2 (two) times daily.  zinc gluconate 50 MG tablet Take 50 mg by mouth daily.        Disposition: home  Final Dx: posterior cervical fusion C3-7  Discharge Instructions      Remove dressing in 72 hours   Complete by: As directed    Call MD for:  difficulty breathing, headache or visual disturbances   Complete by: As directed    Call MD for:  persistant nausea and vomiting   Complete by: As directed    Call MD for:  redness, tenderness, or signs of infection (pain, swelling, redness, odor or green/yellow discharge around incision site)   Complete by: As directed    Call MD for:  severe uncontrolled pain   Complete by: As directed    Call MD for:  temperature >100.4   Complete by: As directed    Diet - low sodium heart healthy   Complete by: As directed    Increase activity slowly   Complete by: As directed           Signed: Tia Alert 07/01/2022, 7:55 AM

## 2022-07-01 NOTE — Progress Notes (Signed)
Orthopedic Tech Progress Note Patient Details:  [name] 03/28/1950 [id number]  Called by Tyrone Apple RN for soft collar. Ortho Devices Type of Ortho Device: Soft collar Ortho Device/Splint Location: On pt Ortho Device/Splint Interventions: Ordered, Application, Adjustment   Post Interventions Patient Tolerated: Well Instructions Provided: Adjustment of device, Care of device  [name] 07/01/2022, 4:38 PM

## 2022-07-01 NOTE — Evaluation (Signed)
Occupational Therapy Evaluation Patient Details Name: [name] MRN: [id number] DOB: 01/13/1951 Today's Date: 07/01/2022   History of Present Illness 72 yo F adm for ACDF.  PMH includes: anxiety/depression, prior neck and back surgeries.   Clinical Impression   Patient admitted for the diagnosis above.  PTA she lives with her spouse, and remained independent despite back pain.  Patient is doing well, she is very close to her baseline, and has a good understanding of all precautions.  NO further OT needs exist in the acute setting, and recommend follow up as prescribed by MD.        Recommendations for follow up therapy are one component of a multi-disciplinary discharge planning process, led by the attending physician.  Recommendations may be updated based on patient status, additional functional criteria and insurance authorization.   Assistance Recommended at Discharge Set up Supervision/Assistance  Patient can return home with the following Assist for transportation    Functional Status Assessment  Patient has not had a recent decline in their functional status  Equipment Recommendations  None recommended by OT    Recommendations for Other Services       Precautions / Restrictions Precautions Precautions: Cervical Precaution Booklet Issued: Yes (comment) Precaution Comments: verbalized understanding Required Braces or Orthoses: Cervical Brace Cervical Brace: For comfort Restrictions Weight Bearing Restrictions: No      Mobility Bed Mobility Overal bed mobility: Modified Independent                  Transfers Overall transfer level: Modified independent                        Balance Overall balance assessment: Mild deficits observed, not formally tested                                         ADL either performed or assessed with clinical judgement   ADL Overall ADL's : At baseline                                              Vision Patient Visual Report: No change from baseline       Perception     Praxis      Pertinent Vitals/Pain Pain Assessment Pain Assessment: Faces Faces Pain Scale: Hurts even more Pain Descriptors / Indicators: Operative site guarding Pain Intervention(s): Monitored during session     Hand Dominance Right   Extremity/Trunk Assessment Upper Extremity Assessment Upper Extremity Assessment: Overall WFL for tasks assessed   Lower Extremity Assessment Lower Extremity Assessment: Overall WFL for tasks assessed   Cervical / Trunk Assessment Cervical / Trunk Assessment: Neck Surgery   Communication Communication Communication: No difficulties   Cognition Arousal/Alertness: Awake/alert Behavior During Therapy: WFL for tasks assessed/performed Overall Cognitive Status: Within Functional Limits for tasks assessed                                       General Comments   VSS on RA    Exercises     Shoulder Instructions      Home Living Family/patient expects to be discharged to:: Private residence Living Arrangements: Spouse/significant other;Other relatives  Available Help at Discharge: Family;Available 24 hours/day Type of Home: House Home Access: Stairs to enter [employer] of Steps: 2 Entrance Stairs-Rails: None Home Layout: One level     Bathroom Shower/Tub: Chief Strategy Officer: Standard Bathroom Accessibility: Yes How Accessible: Accessible via walker Home Equipment: Cane - single point;Adaptive equipment Adaptive Equipment: Sock aid;Reacher;Long-handled sponge        Prior Functioning/Environment Prior Level of Function : Independent/Modified Independent;Driving                        OT Problem List: Pain      OT Treatment/Interventions:      OT Goals(Current goals can be found in the care plan section) Acute Rehab OT Goals Patient Stated Goal: Return home OT Goal  Formulation: With patient Time For Goal Achievement: 07/09/22 Potential to Achieve Goals: Good  OT Frequency:      Co-evaluation              AM-PAC OT "6 Clicks" Daily Activity     Outcome Measure Help from another person eating meals?: None Help from another person taking care of personal grooming?: None Help from another person toileting, which includes using toliet, bedpan, or urinal?: None Help from another person bathing (including washing, rinsing, drying)?: None Help from another person to put on and taking off regular upper body clothing?: None Help from another person to put on and taking off regular lower body clothing?: None 6 Click Score: 24   End of Session Nurse Communication: Mobility status  Activity Tolerance: Patient tolerated treatment well Patient left: in bed;with call bell/phone within reach  OT Visit Diagnosis: Unsteadiness on feet (R26.81)                Time: 1610-9604 OT Time Calculation (min): 21 min Charges:  OT General Charges $OT Visit: 1 Visit OT Evaluation $OT Eval Moderate Complexity: 1 Mod  07/01/2022  RP, OTR/L  Acute Rehabilitation Services  Office:  [phone]    Suzanna Obey 07/01/2022, 8:42 AM

## 2022-07-01 NOTE — Plan of Care (Signed)

## 2022-07-01 NOTE — Anesthesia Postprocedure Evaluation (Signed)
Anesthesia Post Note  Patient: [name]  Procedure(s) Performed: Posterior cervical fusion with lateral mass fixation Cervical three-cervical seven (Neck)     Patient location during evaluation: PACU Anesthesia Type: General Level of consciousness: awake and alert Pain management: pain level controlled Vital Signs Assessment: post-procedure vital signs reviewed and stable Respiratory status: spontaneous breathing, nonlabored ventilation, respiratory function stable and patient connected to nasal cannula oxygen Cardiovascular status: blood pressure returned to baseline and stable Postop Assessment: no apparent nausea or vomiting Anesthetic complications: no   There were no known notable events for this encounter.  Last Vitals:  Vitals:   07/01/22 0328 07/01/22 0734  BP: (!) 150/74 (!) 159/75  Pulse: 96 87  Resp: 18 16  Temp: 36.8 C 36.6 C  SpO2: 97% 100%    Last Pain:  Vitals:   07/01/22 0800  TempSrc:   PainSc: 8                  Cristol Engdahl P Sameeha Rockefeller

## 2022-09-09 ENCOUNTER — Emergency Department (HOSPITAL_COMMUNITY): Payer: Medicare Other

## 2022-09-09 ENCOUNTER — Other Ambulatory Visit: Payer: Self-pay

## 2022-09-09 ENCOUNTER — Encounter (HOSPITAL_COMMUNITY): Payer: Self-pay

## 2022-09-09 ENCOUNTER — Inpatient Hospital Stay (HOSPITAL_COMMUNITY)
Admission: EM | Admit: 2022-09-09 | Discharge: 2022-10-03 | DRG: 391 | Disposition: E | Payer: Medicare Other | Attending: Internal Medicine | Admitting: Internal Medicine

## 2022-09-09 DIAGNOSIS — K559 Vascular disorder of intestine, unspecified: Secondary | ICD-10-CM | POA: Diagnosis present

## 2022-09-09 DIAGNOSIS — R0689 Other abnormalities of breathing: Secondary | ICD-10-CM | POA: Diagnosis not present

## 2022-09-09 DIAGNOSIS — E872 Acidosis, unspecified: Secondary | ICD-10-CM

## 2022-09-09 DIAGNOSIS — G8929 Other chronic pain: Secondary | ICD-10-CM | POA: Diagnosis present

## 2022-09-09 DIAGNOSIS — R1319 Other dysphagia: Secondary | ICD-10-CM | POA: Diagnosis not present

## 2022-09-09 DIAGNOSIS — Z9049 Acquired absence of other specified parts of digestive tract: Secondary | ICD-10-CM

## 2022-09-09 DIAGNOSIS — M797 Fibromyalgia: Secondary | ICD-10-CM | POA: Diagnosis present

## 2022-09-09 DIAGNOSIS — E876 Hypokalemia: Secondary | ICD-10-CM | POA: Diagnosis present

## 2022-09-09 DIAGNOSIS — Z515 Encounter for palliative care: Secondary | ICD-10-CM

## 2022-09-09 DIAGNOSIS — E871 Hypo-osmolality and hyponatremia: Secondary | ICD-10-CM | POA: Diagnosis present

## 2022-09-09 DIAGNOSIS — E860 Dehydration: Secondary | ICD-10-CM | POA: Diagnosis present

## 2022-09-09 DIAGNOSIS — D6489 Other specified anemias: Secondary | ICD-10-CM | POA: Diagnosis present

## 2022-09-09 DIAGNOSIS — J9601 Acute respiratory failure with hypoxia: Secondary | ICD-10-CM | POA: Diagnosis not present

## 2022-09-09 DIAGNOSIS — I468 Cardiac arrest due to other underlying condition: Secondary | ICD-10-CM | POA: Diagnosis not present

## 2022-09-09 DIAGNOSIS — K5903 Drug induced constipation: Secondary | ICD-10-CM | POA: Diagnosis present

## 2022-09-09 DIAGNOSIS — R6521 Severe sepsis with septic shock: Secondary | ICD-10-CM | POA: Diagnosis not present

## 2022-09-09 DIAGNOSIS — E875 Hyperkalemia: Secondary | ICD-10-CM | POA: Diagnosis present

## 2022-09-09 DIAGNOSIS — R1310 Dysphagia, unspecified: Secondary | ICD-10-CM | POA: Diagnosis present

## 2022-09-09 DIAGNOSIS — J690 Pneumonitis due to inhalation of food and vomit: Secondary | ICD-10-CM | POA: Diagnosis not present

## 2022-09-09 DIAGNOSIS — T402X5A Adverse effect of other opioids, initial encounter: Secondary | ICD-10-CM | POA: Diagnosis present

## 2022-09-09 DIAGNOSIS — G931 Anoxic brain damage, not elsewhere classified: Secondary | ICD-10-CM | POA: Diagnosis not present

## 2022-09-09 DIAGNOSIS — R7989 Other specified abnormal findings of blood chemistry: Secondary | ICD-10-CM

## 2022-09-09 DIAGNOSIS — K219 Gastro-esophageal reflux disease without esophagitis: Secondary | ICD-10-CM | POA: Diagnosis not present

## 2022-09-09 DIAGNOSIS — Z79891 Long term (current) use of opiate analgesic: Secondary | ICD-10-CM

## 2022-09-09 DIAGNOSIS — N170 Acute kidney failure with tubular necrosis: Secondary | ICD-10-CM | POA: Diagnosis not present

## 2022-09-09 DIAGNOSIS — D751 Secondary polycythemia: Secondary | ICD-10-CM | POA: Diagnosis present

## 2022-09-09 DIAGNOSIS — K56699 Other intestinal obstruction unspecified as to partial versus complete obstruction: Secondary | ICD-10-CM

## 2022-09-09 DIAGNOSIS — F329 Major depressive disorder, single episode, unspecified: Secondary | ICD-10-CM | POA: Diagnosis present

## 2022-09-09 DIAGNOSIS — Z66 Do not resuscitate: Secondary | ICD-10-CM | POA: Diagnosis present

## 2022-09-09 DIAGNOSIS — R109 Unspecified abdominal pain: Secondary | ICD-10-CM | POA: Diagnosis not present

## 2022-09-09 DIAGNOSIS — K5909 Other constipation: Secondary | ICD-10-CM

## 2022-09-09 DIAGNOSIS — I469 Cardiac arrest, cause unspecified: Secondary | ICD-10-CM

## 2022-09-09 DIAGNOSIS — Z87891 Personal history of nicotine dependence: Secondary | ICD-10-CM

## 2022-09-09 DIAGNOSIS — K5981 Ogilvie syndrome: Principal | ICD-10-CM | POA: Diagnosis present

## 2022-09-09 DIAGNOSIS — M542 Cervicalgia: Secondary | ICD-10-CM | POA: Diagnosis present

## 2022-09-09 DIAGNOSIS — H5509 Other forms of nystagmus: Secondary | ICD-10-CM | POA: Diagnosis not present

## 2022-09-09 DIAGNOSIS — Z90710 Acquired absence of both cervix and uterus: Secondary | ICD-10-CM

## 2022-09-09 DIAGNOSIS — Z981 Arthrodesis status: Secondary | ICD-10-CM

## 2022-09-09 DIAGNOSIS — F410 Panic disorder [episodic paroxysmal anxiety] without agoraphobia: Secondary | ICD-10-CM | POA: Diagnosis present

## 2022-09-09 DIAGNOSIS — G9341 Metabolic encephalopathy: Secondary | ICD-10-CM | POA: Diagnosis not present

## 2022-09-09 DIAGNOSIS — M549 Dorsalgia, unspecified: Secondary | ICD-10-CM | POA: Diagnosis present

## 2022-09-09 DIAGNOSIS — A419 Sepsis, unspecified organism: Secondary | ICD-10-CM | POA: Diagnosis not present

## 2022-09-09 DIAGNOSIS — J9602 Acute respiratory failure with hypercapnia: Secondary | ICD-10-CM | POA: Diagnosis not present

## 2022-09-09 DIAGNOSIS — Z79899 Other long term (current) drug therapy: Secondary | ICD-10-CM

## 2022-09-09 DIAGNOSIS — F419 Anxiety disorder, unspecified: Secondary | ICD-10-CM | POA: Diagnosis present

## 2022-09-09 DIAGNOSIS — J189 Pneumonia, unspecified organism: Secondary | ICD-10-CM | POA: Diagnosis not present

## 2022-09-09 DIAGNOSIS — E874 Mixed disorder of acid-base balance: Secondary | ICD-10-CM | POA: Diagnosis present

## 2022-09-09 DIAGNOSIS — F330 Major depressive disorder, recurrent, mild: Secondary | ICD-10-CM | POA: Diagnosis present

## 2022-09-09 DIAGNOSIS — R748 Abnormal levels of other serum enzymes: Secondary | ICD-10-CM | POA: Diagnosis present

## 2022-09-09 DIAGNOSIS — K2289 Other specified disease of esophagus: Secondary | ICD-10-CM | POA: Diagnosis present

## 2022-09-09 LAB — CREATININE, SERUM
Creatinine, Ser: 1.68 mg/dL — ABNORMAL HIGH (ref 0.44–1.00)
GFR, Estimated: 33 mL/min — ABNORMAL LOW (ref >60)

## 2022-09-09 LAB — CBC WITH DIFFERENTIAL/PLATELET
Abs Immature Granulocytes: 0.01 10*3/uL (ref 0.00–0.07)
Basophils Absolute: 0 10*3/uL (ref 0.0–0.1)
Basophils Relative: 0 %
Eosinophils Absolute: 0 10*3/uL (ref 0.0–0.5)
Eosinophils Relative: 0 %
HCT: 47.9 % — ABNORMAL HIGH (ref 36.0–46.0)
Hemoglobin: 15.2 g/dL — ABNORMAL HIGH (ref 12.0–15.0)
Immature Granulocytes: 0 %
Lymphocytes Relative: 13 %
Lymphs Abs: 1.1 10*3/uL (ref 0.7–4.0)
MCH: 30.2 pg (ref 26.0–34.0)
MCHC: 31.7 g/dL (ref 30.0–36.0)
MCV: 95.2 fL (ref 80.0–100.0)
Monocytes Absolute: 0.4 10*3/uL (ref 0.1–1.0)
Monocytes Relative: 4 %
Neutro Abs: 6.8 10*3/uL (ref 1.7–7.7)
Neutrophils Relative %: 83 %
Platelets: 367 10*3/uL (ref 150–400)
RBC: 5.03 MIL/uL (ref 3.87–5.11)
RDW: 12.8 % (ref 11.5–15.5)
WBC: 8.3 10*3/uL (ref 4.0–10.5)
nRBC: 0 % (ref 0.0–0.2)

## 2022-09-09 LAB — CBC
HCT: 38.9 % (ref 36.0–46.0)
Hemoglobin: 12.4 g/dL (ref 12.0–15.0)
MCH: 30.4 pg (ref 26.0–34.0)
MCHC: 31.9 g/dL (ref 30.0–36.0)
MCV: 95.3 fL (ref 80.0–100.0)
Platelets: 225 10*3/uL (ref 150–400)
RBC: 4.08 MIL/uL (ref 3.87–5.11)
RDW: 13 % (ref 11.5–15.5)
WBC: 5.1 10*3/uL (ref 4.0–10.5)
nRBC: 0 % (ref 0.0–0.2)

## 2022-09-09 LAB — COMPREHENSIVE METABOLIC PANEL
ALT: 104 U/L — ABNORMAL HIGH (ref 0–44)
AST: 157 U/L — ABNORMAL HIGH (ref 15–41)
Albumin: 3.9 g/dL (ref 3.5–5.0)
Alkaline Phosphatase: 109 U/L (ref 38–126)
Anion gap: 18 — ABNORMAL HIGH (ref 5–15)
BUN: 18 mg/dL (ref 8–23)
CO2: 20 mmol/L — ABNORMAL LOW (ref 22–32)
Calcium: 8.9 mg/dL (ref 8.9–10.3)
Chloride: 91 mmol/L — ABNORMAL LOW (ref 98–111)
Creatinine, Ser: 1.44 mg/dL — ABNORMAL HIGH (ref 0.44–1.00)
GFR, Estimated: 39 mL/min — ABNORMAL LOW (ref >60)
Glucose, Bld: 178 mg/dL — ABNORMAL HIGH (ref 70–99)
Potassium: 3.8 mmol/L (ref 3.5–5.1)
Sodium: 129 mmol/L — ABNORMAL LOW (ref 135–145)
Total Bilirubin: 0.8 mg/dL (ref 0.3–1.2)
Total Protein: 7.3 g/dL (ref 6.5–8.1)

## 2022-09-09 LAB — LIPASE, BLOOD: Lipase: 24 U/L (ref 11–51)

## 2022-09-09 LAB — HIV ANTIBODY (ROUTINE TESTING W REFLEX): HIV Screen 4th Generation wRfx: NONREACTIVE

## 2022-09-09 LAB — MAGNESIUM: Magnesium: 4.7 mg/dL — ABNORMAL HIGH (ref 1.7–2.4)

## 2022-09-09 MED ORDER — SODIUM CHLORIDE 0.9 % IV SOLN: INTRAVENOUS | Status: DC

## 2022-09-09 MED ORDER — SODIUM CHLORIDE 0.9 % IV BOLUS
500.0000 mL | Freq: Once | INTRAVENOUS | Status: AC
  Administered 2022-09-09: 500 mL via INTRAVENOUS

## 2022-09-09 MED ORDER — ACETAMINOPHEN 650 MG RE SUPP: 650.0000 mg | Freq: Four times a day (QID) | RECTAL | Status: DC | PRN

## 2022-09-09 MED ORDER — HYDROMORPHONE HCL 1 MG/ML IJ SOLN
1.0000 mg | Freq: Once | INTRAMUSCULAR | Status: AC
  Administered 2022-09-09: 1 mg via INTRAVENOUS
  Filled 2022-09-09: qty 1

## 2022-09-09 MED ORDER — SENNA 8.6 MG PO TABS
1.0000 | ORAL_TABLET | Freq: Two times a day (BID) | ORAL | Status: DC
  Filled 2022-09-09: qty 1

## 2022-09-09 MED ORDER — ACETAMINOPHEN 325 MG PO TABS: 650.0000 mg | ORAL_TABLET | Freq: Four times a day (QID) | ORAL | Status: DC | PRN

## 2022-09-09 MED ORDER — ONDANSETRON HCL 4 MG/2ML IJ SOLN: 4.0000 mg | Freq: Four times a day (QID) | INTRAMUSCULAR | Status: DC | PRN

## 2022-09-09 MED ORDER — BISACODYL 5 MG PO TBEC
20.0000 mg | DELAYED_RELEASE_TABLET | Freq: Once | ORAL | Status: AC
  Administered 2022-09-09: 20 mg via ORAL
  Filled 2022-09-09: qty 4

## 2022-09-09 MED ORDER — IOHEXOL 350 MG/ML SOLN
70.0000 mL | Freq: Once | INTRAVENOUS | Status: AC | PRN
  Administered 2022-09-09: 70 mL via INTRAVENOUS

## 2022-09-09 MED ORDER — HYDROMORPHONE HCL 1 MG/ML IJ SOLN
0.5000 mg | INTRAMUSCULAR | Status: DC | PRN
  Administered 2022-09-09: 1 mg via INTRAVENOUS
  Filled 2022-09-09: qty 1

## 2022-09-09 MED ORDER — SODIUM CHLORIDE 0.9 % IV SOLN
12.5000 mg | Freq: Once | INTRAVENOUS | Status: AC
  Administered 2022-09-09: 12.5 mg via INTRAVENOUS
  Filled 2022-09-09: qty 12.5

## 2022-09-09 MED ORDER — DOCUSATE SODIUM 100 MG PO CAPS
100.0000 mg | ORAL_CAPSULE | Freq: Two times a day (BID) | ORAL | Status: DC
  Filled 2022-09-09: qty 1

## 2022-09-09 MED ORDER — SORBITOL 70 % SOLN
960.0000 mL | TOPICAL_OIL | Freq: Once | ORAL | Status: AC
  Administered 2022-09-09: 960 mL via RECTAL
  Filled 2022-09-09: qty 240

## 2022-09-09 MED ORDER — HYOSCYAMINE SULFATE 0.5 MG/ML IJ SOLN
0.2500 mg | INTRAMUSCULAR | Status: DC | PRN
  Administered 2022-09-09: 0.25 mg via INTRAVENOUS
  Filled 2022-09-09 (×2): qty 0.5

## 2022-09-09 MED ORDER — ENOXAPARIN SODIUM 40 MG/0.4ML IJ SOSY
40.0000 mg | PREFILLED_SYRINGE | INTRAMUSCULAR | Status: DC
  Administered 2022-09-09: 40 mg via SUBCUTANEOUS
  Filled 2022-09-09: qty 0.4

## 2022-09-09 MED ORDER — SODIUM CHLORIDE 0.9 % IV BOLUS
1000.0000 mL | Freq: Once | INTRAVENOUS | Status: AC
  Administered 2022-09-09: 1000 mL via INTRAVENOUS

## 2022-09-09 MED ORDER — HYDROMORPHONE HCL 1 MG/ML IJ SOLN
0.5000 mg | INTRAMUSCULAR | Status: DC | PRN
  Administered 2022-09-09 (×3): 1 mg via INTRAVENOUS
  Filled 2022-09-09 (×3): qty 1

## 2022-09-09 MED ORDER — ONDANSETRON HCL 4 MG PO TABS: 4.0000 mg | ORAL_TABLET | Freq: Four times a day (QID) | ORAL | Status: DC | PRN

## 2022-09-09 NOTE — ED Notes (Signed)
Patient transported to X-ray 

## 2022-09-09 NOTE — ED Notes (Signed)
Patient transported to CT 

## 2022-09-09 NOTE — ED Notes (Signed)
ED TO INPATIENT HANDOFF REPORT  ED Nurse Name and Phone #: Virgilio Belling Name/Age/Gender [name] 72 y.o. female Room/Bed: 041C/041C  Code Status   Code Status: Full Code  Home/SNF/Other Home Patient oriented to: self, place, time, and situation Is this baseline? 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 Patient states she takes oxycodone for neck pain after surgeries, stating she takes up to 4 10-325 per day. Patient states she had a normal bowel movement 4-5 days ago, with a small bowel movement yesterday with no relief. Patient's abdomen is tender on palpation.    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]  Admitting Physician: Chevis Pretty  Attending Physician: Chevis Pretty  Certification:: I certify this patient will need inpatient services for at least 2 midnights  Estimated Length of Stay: 3          B Medical/Surgery History Past Medical History:  Diagnosis Date   Anemia    Anxiety 2000   Arthritis    Depression 2000   Family history of adverse reaction to anesthesia    mother and sister have nausea after surgery   Fibromyalgia    GERD (gastroesophageal reflux disease)    Headache(784.0)    Pneumonia    PONV (postoperative nausea and vomiting)    Seasonal allergies    Past Surgical History:  Procedure Laterality Date   ABDOMINAL HYSTERECTOMY  1984   ANTERIOR  CERVICAL DECOMP/DISCECTOMY FUSION N/A 10/16/2021   Procedure: Anterior Cervical Discectomy and Fusion Cervical Three-Four;  Surgeon: Tia Alert, MD;  Location: [facility] OR;  Service: Neurosurgery;  Laterality: N/A;  3C   BACK SURGERY  2006, 2008   lower back, fusion in l5-s1   CHOLECYSTECTOMY  2003   ERCP W/ SPHINCTEROTOMY AND BALLOON DILATION  2005   pancreatitis    LUMBAR LAMINECTOMY/DECOMPRESSION MICRODISCECTOMY N/A 05/31/2012   Procedure: MICRO LUMBAR DECOMPRESSION L4 - L5, L5 - S1 ;  Surgeon: Javier Docker, MD;  Location: [facility] ORS;  Service: Orthopedics;  Laterality: N/A;   LUMBAR LAMINECTOMY/DECOMPRESSION MICRODISCECTOMY Right 02/06/2014   Procedure: LUMBAR DECOMPRESSION L3-L4, REDO LUMBAR DECOMPRESSION L4-L5 ON THE RIGHT;  Surgeon: Javier Docker, MD;  Location: [facility] ORS;  Service: Orthopedics;  Laterality: Right;   NECK SURGERY  2001,2007   herniated disc- cervical fusion(limited side to side mobility)   POSTERIOR CERVICAL FUSION/FORAMINOTOMY N/A 06/30/2022   Procedure: Posterior cervical fusion with lateral mass fixation Cervical three-cervical seven;  Surgeon: Tia Alert, MD;  Location: [facility] OR;  Service: Neurosurgery;  Laterality: N/A;   SHOULDER ARTHROSCOPY Bilateral 2010, 2011   debridement     A IV Location/Drains/Wounds Patient Lines/Drains/Airways Status     Active Line/Drains/Airways     Name Placement date Placement time Site Days   Peripheral IV 09/09/22 20 G Right Antecubital 09/09/22  0811  Antecubital  less than 1            Intake/Output Last  24 hours No intake or output data in the 24 hours ending 09/09/22 1413  Labs/Imaging Results for orders placed or performed during the hospital encounter of 09/09/22 (from the past 48 hour(s))  Comprehensive metabolic panel     Status: Abnormal   Collection Time: 09/09/22  8:13 AM  Result Value Ref Range   Sodium 129 (L) 135 - 145 mmol/L   Potassium 3.8 3.5 - 5.1 mmol/L   Chloride 91 (L) 98 - 111 mmol/L   CO2 20 (L)  22 - 32 mmol/L   Glucose, Bld 178 (H) 70 - 99 mg/dL    Comment: Glucose reference range applies only to samples taken after fasting for at least 8 hours.   BUN 18 8 - 23 mg/dL   Creatinine, Ser 2.95 (H) 0.44 - 1.00 mg/dL   Calcium 8.9 8.9 - 28.4 mg/dL   Total Protein 7.3 6.5 - 8.1 g/dL   Albumin 3.9 3.5 - 5.0 g/dL   AST 132 (H) 15 - 41 U/L   ALT 104 (H) 0 - 44 U/L    Comment: RESULT CONFIRMED BY MANUAL DILUTION   Alkaline Phosphatase 109 38 - 126 U/L   Total Bilirubin 0.8 0.3 - 1.2 mg/dL   GFR, Estimated 39 (L) >60 mL/min    Comment: (NOTE) Calculated using the CKD-EPI Creatinine Equation (2021)    Anion gap 18 (H) 5 - 15    Comment: Performed at [facility], [address] [place], [place] [address]  CBC with Differential     Status: Abnormal   Collection Time: 09/09/22  8:13 AM  Result Value Ref Range   WBC 8.3 4.0 - 10.5 K/uL   RBC 5.03 3.87 - 5.11 MIL/uL   Hemoglobin 15.2 (H) 12.0 - 15.0 g/dL   HCT 27.2 (H) 53.6 - 64.4 %   MCV 95.2 80.0 - 100.0 fL   MCH 30.2 26.0 - 34.0 pg   MCHC 31.7 30.0 - 36.0 g/dL   RDW 03.4 74.2 - 59.5 %   Platelets 367 150 - 400 K/uL   nRBC 0.0 0.0 - 0.2 %   Neutrophils Relative % 83 %   Neutro Abs 6.8 1.7 - 7.7 K/uL   Lymphocytes Relative 13 %   Lymphs Abs 1.1 0.7 - 4.0 K/uL   Monocytes Relative 4 %   Monocytes Absolute 0.4 0.1 - 1.0 K/uL   Eosinophils Relative 0 %   Eosinophils Absolute 0.0 0.0 - 0.5 K/uL   Basophils Relative 0 %   Basophils Absolute 0.0 0.0 - 0.1 K/uL   Immature Granulocytes 0 %   Abs Immature Granulocytes 0.01 0.00 - 0.07 K/uL    Comment: Performed at [facility], [address] [place], [place] [address]  Lipase, blood     Status: None   Collection Time: 09/09/22  8:13 AM  Result Value Ref Range   Lipase 24 11 - 51 U/L    Comment: Performed at [facility], [address] [place], [place] [address]  Magnesium     Status: Abnormal   Collection Time: 09/09/22  8:13 AM  Result Value Ref Range    Magnesium 4.7 (H) 1.7 - 2.4 mg/dL    Comment: Performed at [facility], [address] [place], [place] [address]   CT ABDOMEN PELVIS W CONTRAST  Result Date: 09/09/2022 CLINICAL DATA:  Bowel obstruction suspected EXAM: CT ABDOMEN AND PELVIS WITH CONTRAST TECHNIQUE: Multidetector CT imaging of the abdomen and pelvis was performed  using the standard protocol following bolus administration of intravenous contrast. RADIATION DOSE REDUCTION: This exam was performed according to the departmental dose-optimization program which includes automated exposure control, adjustment of the mA and/or kV according to patient size and/or use of iterative reconstruction technique. CONTRAST:  70mL OMNIPAQUE IOHEXOL 350 MG/ML SOLN COMPARISON:  None Available. FINDINGS: Lower chest: There are dependent and patchy atelectatic changes in the visualized lung bases. No overt consolidation. No pleural effusion. The heart is normal in size. No pericardial effusion. Hepatobiliary: The liver is normal in size. Non-cirrhotic configuration. No suspicious mass. No intrahepatic or extrahepatic bile duct dilation. Gallbladder is surgically absent. Pancreas: Small/atrophic pancreas. No focal lesion. No pancreatic ductal dilatation or surrounding inflammatory changes. Spleen: Within normal limits. No focal lesion. Adrenals/Urinary Tract: Adrenal glands are unremarkable. No suspicious renal mass. No hydronephrosis. No renal or ureteric calculi. Unremarkable urinary bladder. Stomach/Bowel: Visualized lower thoracic esophagus is dilated and fluid-filled, which is nonspecific but most likely seen in the settings of chronic gastroesophageal reflux disease versus esophageal dysmotility. There is marked disproportionate dilation of the colon measuring up to 8.4 cm in diameter. There is large amount of fluid and stool within. Stomach and small bowel loops including appendix are nondilated. No evidence of abnormal bowel wall thickening or  inflammatory changes. No pneumatosis or portal venous gas. The appendix is unremarkable. Vascular/Lymphatic: No ascites or pneumoperitoneum. No abdominal or pelvic lymphadenopathy, by size criteria. No aneurysmal dilation of the major abdominal arteries. There are mild peripheral atherosclerotic vascular calcifications of the aorta and its major branches. Reproductive: The uterus is surgically absent. No large adnexal mass. Other: There is a tiny fat containing umbilical hernia. The soft tissues and abdominal wall are otherwise unremarkable. Musculoskeletal: No suspicious osseous lesions. There are mild multilevel degenerative changes in the visualized spine. Posterior spinal fixation of L3 through S1 noted. IMPRESSION: *Marked disproportionate dilation of the colon with large amount of fluid and stool within. Nondilated stomach and small bowel loops. No mechanical obstruction noted. 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 *Multiple other nonacute observations, as described above. Electronically Signed   By: Jules Schick M.D.   On: 09/09/2022 10:52   DG Abdomen Acute W/Chest  Result Date: 09/09/2022 CLINICAL DATA:  abdominal distension, eval for free air/evidence of perforation EXAM: DG ABDOMEN ACUTE WITH 1 VIEW CHEST COMPARISON:  06/13/2020 FINDINGS: There is no evidence of dilated bowel loops or free intraperitoneal air. No radiopaque calculi or other significant radiographic abnormality is seen. Heart size and mediastinal contours are within normal limits. Both lungs are clear. Partially seen lower cervical and lower lumbar spinal fixation hardware. IMPRESSION: Negative abdominal radiographs.  No acute cardiopulmonary disease. No free intraperitoneal air. Electronically Signed   By: Jules Schick M.D.   On: 09/09/2022 08:47    Pending Labs Unresulted Labs (From admission, onward)     Start     Ordered   09/16/22 0500  Creatinine, serum  (enoxaparin (LOVENOX)    CrCl >/=  30 ml/min)  Weekly,   R     Comments: while on enoxaparin therapy    09/09/22 1257   09/21/2022 0500  Comprehensive metabolic panel  Tomorrow morning,   R        09/09/22 1257   10/02/2022 0500  CBC  Tomorrow morning,   R        09/09/22 1257   09/21/2022 0500  Magnesium  Tomorrow morning,   R        09/09/22 1257   09/09/2022  0500  Phosphorus  Tomorrow morning,   R        09/09/22 1257   09/09/22 1255  HIV Antibody (routine testing w rflx)  (HIV Antibody (Routine testing w reflex) panel)  Once,   R        09/09/22 1257   09/09/22 1255  CBC  (enoxaparin (LOVENOX)    CrCl >/= 30 ml/min)  Once,   R       Comments: Baseline for enoxaparin therapy IF NOT ALREADY DRAWN.  Notify MD if PLT < 100 K.    09/09/22 1257   09/09/22 1255  Creatinine, serum  (enoxaparin (LOVENOX)    CrCl >/= 30 ml/min)  Once,   R       Comments: Baseline for enoxaparin therapy IF NOT ALREADY DRAWN.    09/09/22 1257            Vitals/Pain Today's Vitals   09/09/22 1132 09/09/22 1202 09/09/22 1230 09/09/22 1243  BP:  (!) 89/66 (!) 92/53   Pulse:  93 92   Resp:  (!) 21 (!) 22   Temp:      TempSrc:      SpO2:  99% 97%   Weight:      Height:      PainSc: 7    4     Isolation Precautions No active isolations  Medications Medications  enoxaparin (LOVENOX) injection 40 mg (has no administration in time range)  0.9 %  sodium chloride infusion (has no administration in time range)  acetaminophen (TYLENOL) tablet 650 mg (has no administration in time range)    Or  acetaminophen (TYLENOL) suppository 650 mg (has no administration in time range)  HYDROmorphone (DILAUDID) injection 0.5-1 mg (has no administration in time range)  docusate sodium (COLACE) capsule 100 mg (0 mg Oral Hold 09/09/22 1308)  senna (SENOKOT) tablet 8.6 mg (0 mg Oral Hold 09/09/22 1309)  ondansetron (ZOFRAN) tablet 4 mg (has no administration in time range)    Or  ondansetron (ZOFRAN) injection 4 mg (has no administration in time range)   sorbitol, milk of mag, mineral oil, glycerin (SMOG) enema (has no administration in time range)  HYDROmorphone (DILAUDID) injection 1 mg (1 mg Intravenous Given 09/09/22 0841)  promethazine (PHENERGAN) 12.5 mg in sodium chloride 0.9 % 50 mL IVPB (0 mg Intravenous Stopped 09/09/22 0938)  sodium chloride 0.9 % bolus 1,000 mL (0 mLs Intravenous Stopped 09/09/22 1200)  iohexol (OMNIPAQUE) 350 MG/ML injection 70 mL (70 mLs Intravenous Contrast Given 09/09/22 1040)  HYDROmorphone (DILAUDID) injection 1 mg (1 mg Intravenous Given 09/09/22 1221)  sodium chloride 0.9 % bolus 1,000 mL (1,000 mLs Intravenous New Bag/Given 09/09/22 1220)    Mobility walks     Focused Assessments     R Recommendations: See Admitting Provider Note  Report given to:   Additional Notes:

## 2022-09-09 NOTE — ED Triage Notes (Signed)
Patient coming POV to ED for complaints of generalized abdominal pain with swelling. Patient states she takes oxycodone for neck pain after surgeries, stating she takes up to 4 10-325 per day. Patient states she had a normal bowel movement 4-5 days ago, with a small bowel movement yesterday with no relief. Patient's abdomen is tender on palpation.

## 2022-09-09 NOTE — Consult Note (Addendum)
Consultation Note   Referring Provider:  Emergency Services PCP: Lytle Michaels, PA-C Primary Gastroenterologist: Dr. Ian Malkin - [facility]       Reason for Consultation: pseudoobstruction  DOA: 09/09/2022         Hospital Day: 1   ASSESSMENT & PLAN   Patient is a 72 y.o. year old female with a past medical history of fibromyalgia, depression, IBS, GERD, chronic constipation, pancreatitis, diverticulosis, chronic intermittent dysphagia , presbyesophagus   Acute pseudoobstruction . CT scan showing marked disproportionate dilation of the colon measuring up to 8.4 cm in diameter. There is large amount of fluid and stool within.   Etiology unclear. Could be medication related but she hasn't started anything new. Except for mild hyponatremia her electrolytes are normal. No recent surgeries or illnesses. No known neurologic diseases. She is in quite a bit of discomfort but afebrile with normal WBC count and no peritoneal signs. -Keep electrolytes within normal range. Please correct hyponatremia.  -Do not continue home Robinul Forte for now.  -IV fluids -Hopefully will not need Zofran for long as it can be constipating -SMOG enema now.  -If no improvement  will try Mestinon.   Abnormal liver tests, hepatocellular pattern. AST and ALT are 2-3 x ULN. Historically liver enzymes have been normal. Liver appears normal on CT AP.  -Monitor for now, may be reactive  Colon cancer screening. Per GI notes in Care Everywhere she has a normal colonoscopy in 2018 and is due for 10 year follow up in 2028  GERD / chronic intermittent dysphagia improved with empirical dilation / presbyesophagus . CT scan showing thoracic esophagus dilated and fluid-filled  See PMH for additional medical history   -------------------------------------------------------------------------------------------------------   [facility] Attending   I have taken an interval  history, reviewed the chart and examined the patient. I agree with the Advanced Practitioner's note, impression and recommendations with the following additions:  Exacerbation of chronic constipation/pseudoobstruction  I did a rectal w/ female RN present in ED - no impaction but multiple chunks of hard brown stool  Strart w/ SMOG enema  Will f/u  Iva Boop, MD, [facility] See Loretha Stapler on call - gastroenterology for best contact person 09/09/2022 2:59 PM    HISTORY OF PRESENT ILLNESS   Patient presented to ED today with abdominal distention and generalized abdominal pain. CT scan shows a pseudoobstruction.   She takes Oxycodone for neck pain, Robinul forte daily for IBS.  She takes daily iron. She has chronic constipation but seems to manage okay with daily MIralax having a BM about every other day. Currently however she hasn't had a BM in about 4 days. Two days ago her abdomen started to get distended and become uncomfortable. Yesterday she had progressive distention of the abdomen and severe generalized abdominal pain and nausea. This has never occurred before. Had neck surgery in May, nothing more recent. No recent medication changes.   ED / Admission workup notable for :  WBC normal. Hgb 15.2 ( baseline mid 12) Na 129 Cr 1.44 ( baseline 0.8) AST 157 / ALT 104 ( both historically normal ). Normal AP and bilirubin  CT AP  Marked disproportionate dilation of the colon ( 8 cm) with large amount of  fluid and stool within. Nondilated stomach and small bowel loops. No mechanical obstruction noted. 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  Previous GI Evaluations   Per [facility] ( Dr. Ian Malkin) 03/19/22 office note EGD May 2018 and colonoscopy at that time showed diverticulosis placed on 10-year recall.  EGD November 2019 because of dysphagia with esophagus empirically dilated   ? EGD in Nov 2023 - [facility]   Labs  and Imaging: Recent Labs    09/09/22 0813  WBC 8.3  HGB 15.2*  HCT 47.9*  PLT 367   Recent Labs    09/09/22 0813  NA 129*  K 3.8  CL 91*  CO2 20*  GLUCOSE 178*  BUN 18  CREATININE 1.44*  CALCIUM 8.9   Recent Labs    09/09/22 0813  PROT 7.3  ALBUMIN 3.9  AST 157*  ALT 104*  ALKPHOS 109  BILITOT 0.8   No results for input(s): "HEPBSAG", "HCVAB", "HEPAIGM", "HEPBIGM" in the last 72 hours. No results for input(s): "LABPROT", "INR" in the last 72 hours.    Past Medical History:  Diagnosis Date   Anemia    Anxiety 2000   Arthritis    Depression 2000   Family history of adverse reaction to anesthesia    mother and sister have nausea after surgery   Fibromyalgia    GERD (gastroesophageal reflux disease)    Headache(784.0)    Pneumonia    PONV (postoperative nausea and vomiting)    Seasonal allergies     Past Surgical History:  Procedure Laterality Date   ABDOMINAL HYSTERECTOMY  1984   ANTERIOR CERVICAL DECOMP/DISCECTOMY FUSION N/A 10/16/2021   Procedure: Anterior Cervical Discectomy and Fusion Cervical Three-Four;  Surgeon: Tia Alert, MD;  Location: [facility] OR;  Service: Neurosurgery;  Laterality: N/A;  3C   BACK SURGERY  2006, 2008   lower back, fusion in l5-s1   CHOLECYSTECTOMY  2003   ERCP W/ SPHINCTEROTOMY AND BALLOON DILATION  2005   pancreatitis    LUMBAR LAMINECTOMY/DECOMPRESSION MICRODISCECTOMY N/A 05/31/2012   Procedure: MICRO LUMBAR DECOMPRESSION L4 - L5, L5 - S1 ;  Surgeon: Javier Docker, MD;  Location: [facility] ORS;  Service: Orthopedics;  Laterality: N/A;   LUMBAR LAMINECTOMY/DECOMPRESSION MICRODISCECTOMY Right 02/06/2014   Procedure: LUMBAR DECOMPRESSION L3-L4, REDO LUMBAR DECOMPRESSION L4-L5 ON THE RIGHT;  Surgeon: Javier Docker, MD;  Location: [facility] ORS;  Service: Orthopedics;  Laterality: Right;   NECK SURGERY  2001,2007   herniated disc- cervical fusion(limited side to side mobility)   POSTERIOR CERVICAL FUSION/FORAMINOTOMY N/A 06/30/2022    Procedure: Posterior cervical fusion with lateral mass fixation Cervical three-cervical seven;  Surgeon: Tia Alert, MD;  Location: [facility];  Service: Neurosurgery;  Laterality: N/A;   SHOULDER ARTHROSCOPY Bilateral 2010, 2011   debridement    History reviewed. No pertinent family history.  Prior to Admission medications   Medication Sig Start Date End Date Taking? Authorizing Provider  cyclobenzaprine (FLEXERIL) 10 MG tablet Take 1 tablet by mouth 3 (three) times daily as needed. 07/21/22  Yes [provider]  acyclovir (ZOVIRAX) 200 MG capsule Take 200 mg by mouth 5 (five) times daily as needed (fever blisters). 10/07/21   [provider]  Ascorbic Acid (VITAMIN C) 1000 MG tablet Take 1,000 mg by mouth daily.    [provider]  Calcium Carb-Cholecalciferol (CALCIUM 600 + D PO) Take 1 tablet by mouth daily.    [provider]  cetirizine (ZYRTEC) 10 MG tablet Take 10 mg  by mouth daily.    [provider]  Cholecalciferol (VITAMIN D) 125 MCG (5000 UT) CAPS Take 5,000 Units by mouth 2 (two) times daily.    [provider]  docusate sodium (COLACE) 100 MG capsule Take 1 capsule (100 mg total) by mouth 2 (two) times daily as needed for mild constipation. Patient taking differently: Take 100 mg by mouth daily. 02/06/14   Jene Every, MD  DULoxetine (CYMBALTA) 60 MG capsule Take 60 mg by mouth daily. 06/11/22   [provider]  ferrous sulfate ER (SLOW FE) 142 (45 Fe) MG TBCR tablet Take 45 mg by mouth daily.    [provider]  Ginkgo Biloba 60 MG TABS Take 60 mg by mouth in the morning and at bedtime.    [provider]  glycopyrrolate (ROBINUL) 2 MG tablet Take 2 mg by mouth 2 (two) times daily.    [provider]  lidocaine (LIDODERM) 5 % Place 1-3 patches onto the skin daily as needed (pain). Remove & Discard patch within 12 hours or as directed by MD    [provider]  magnesium gluconate  (MAGONATE) 500 MG tablet Take 500 mg by mouth daily.    [provider]  methocarbamol (ROBAXIN) 500 MG tablet Take 500 mg by mouth 3 (three) times daily as needed for muscle spasms.    [provider]  Multiple Vitamins-Minerals (MULTIVITAMIN PO) Take 1 tablet by mouth daily.    [provider]  naproxen sodium (ALEVE) 220 MG tablet Take 220 mg by mouth daily as needed (pain).    [provider]  Omega-3 Fatty Acids (FISH OIL PO) Take 1 capsule by mouth daily.    [provider]  ondansetron (ZOFRAN) 4 MG tablet Take 4 mg by mouth every 6 (six) hours as needed for nausea/vomiting. 05/02/21   [provider]  oxyCODONE-acetaminophen (PERCOCET) 10-325 MG tablet Take 1 tablet by mouth every 4 (four) hours as needed for pain. 06/18/20   Arman Bogus, MD  pantoprazole (PROTONIX) 40 MG tablet Take 40 mg by mouth 2 (two) times daily.     [provider]  Polyethyl Glycol-Propyl Glycol (SYSTANE ULTRA OP) Place 1 drop into both eyes 2 (two) times daily as needed (dry eyes).    [provider]  polyethylene glycol (MIRALAX / GLYCOLAX) packet Take 17 g by mouth daily.    [provider]  Potassium 99 MG TABS Take 99 mg by mouth 2 (two) times daily.    [provider]  sodium chloride (OCEAN) 0.65 % SOLN nasal spray Place 1 spray into both nostrils as needed for congestion.    [provider]  TURMERIC CURCUMIN PO Take 1,000 mg by mouth daily.    [provider]  vitamin B-12 (CYANOCOBALAMIN) 1000 MCG tablet Take 1,000 mcg by mouth in the morning and at bedtime.    [provider]  zinc gluconate 50 MG tablet Take 50 mg by mouth daily.    [provider]    Current Facility-Administered Medications  Medication Dose Route Frequency Provider Last Rate Last Admin   0.9 %  sodium chloride infusion   Intravenous Continuous Willeen Niece, MD       acetaminophen (TYLENOL) tablet 650 mg   650 mg Oral Q6H PRN Willeen Niece, MD       Or   acetaminophen (TYLENOL) suppository 650 mg  650 mg Rectal Q6H PRN Willeen Niece, MD       docusate sodium (  COLACE) capsule 100 mg  100 mg Oral BID Willeen Niece, MD       enoxaparin (LOVENOX) injection 40 mg  40 mg Subcutaneous Q24H Idelle Leech, Pardeep, MD       HYDROmorphone (DILAUDID) injection 0.5-1 mg  0.5-1 mg Intravenous Q4H PRN Willeen Niece, MD       ondansetron (ZOFRAN) tablet 4 mg  4 mg Oral Q6H PRN Willeen Niece, MD       Or   ondansetron (ZOFRAN) injection 4 mg  4 mg Intravenous Q6H PRN Willeen Niece, MD       senna (SENOKOT) tablet 8.6 mg  1 tablet Oral BID Willeen Niece, MD       Current Outpatient Medications  Medication Sig Dispense Refill   cyclobenzaprine (FLEXERIL) 10 MG tablet Take 1 tablet by mouth 3 (three) times daily as needed.     acyclovir (ZOVIRAX) 200 MG capsule Take 200 mg by mouth 5 (five) times daily as needed (fever blisters).     Ascorbic Acid (VITAMIN C) 1000 MG tablet Take 1,000 mg by mouth daily.     Calcium Carb-Cholecalciferol (CALCIUM 600 + D PO) Take 1 tablet by mouth daily.     cetirizine (ZYRTEC) 10 MG tablet Take 10 mg by mouth daily.     Cholecalciferol (VITAMIN D) 125 MCG (5000 UT) CAPS Take 5,000 Units by mouth 2 (two) times daily.     docusate sodium (COLACE) 100 MG capsule Take 1 capsule (100 mg total) by mouth 2 (two) times daily as needed for mild constipation. (Patient taking differently: Take 100 mg by mouth daily.) 20 capsule 1   DULoxetine (CYMBALTA) 60 MG capsule Take 60 mg by mouth daily.     ferrous sulfate ER (SLOW FE) 142 (45 Fe) MG TBCR tablet Take 45 mg by mouth daily.     Ginkgo Biloba 60 MG TABS Take 60 mg by mouth in the morning and at bedtime.     glycopyrrolate (ROBINUL) 2 MG tablet Take 2 mg by mouth 2 (two) times daily.     lidocaine (LIDODERM) 5 % Place 1-3 patches onto the skin daily as needed (pain). Remove & Discard patch within 12 hours or as directed by MD      magnesium gluconate (MAGONATE) 500 MG tablet Take 500 mg by mouth daily.     methocarbamol (ROBAXIN) 500 MG tablet Take 500 mg by mouth 3 (three) times daily as needed for muscle spasms.     Multiple Vitamins-Minerals (MULTIVITAMIN PO) Take 1 tablet by mouth daily.     naproxen sodium (ALEVE) 220 MG tablet Take 220 mg by mouth daily as needed (pain).     Omega-3 Fatty Acids (FISH OIL PO) Take 1 capsule by mouth daily.     ondansetron (ZOFRAN) 4 MG tablet Take 4 mg by mouth every 6 (six) hours as needed for nausea/vomiting.     oxyCODONE-acetaminophen (PERCOCET) 10-325 MG tablet Take 1 tablet by mouth every 4 (four) hours as needed for pain. 40 tablet 0   pantoprazole (PROTONIX) 40 MG tablet Take 40 mg by mouth 2 (two) times daily.      Polyethyl Glycol-Propyl Glycol (SYSTANE ULTRA OP) Place 1 drop into both eyes 2 (two) times daily as needed (dry eyes).     polyethylene glycol (MIRALAX / GLYCOLAX) packet Take 17 g by mouth daily.     Potassium 99 MG TABS Take 99 mg by mouth 2 (two) times daily.     sodium chloride (OCEAN) 0.65 % SOLN nasal spray  Place 1 spray into both nostrils as needed for congestion.     TURMERIC CURCUMIN PO Take 1,000 mg by mouth daily.     vitamin B-12 (CYANOCOBALAMIN) 1000 MCG tablet Take 1,000 mcg by mouth in the morning and at bedtime.     zinc gluconate 50 MG tablet Take 50 mg by mouth daily.      Allergies as of 09/09/2022   (No Known Allergies)    Social History   Socioeconomic History   Marital status: Married    Spouse name: Not on file   Number of children: Not on file   Years of education: Not on file   Highest education level: Not on file  Occupational History   Not on file  Tobacco Use   Smoking status: Former    Current packs/day: 0.00    Average packs/day: 0.5 packs/day for 3.0 years (1.5 ttl pk-yrs)    Types: Cigarettes    Start date: 02/01/1969    Quit date: 02/02/1972    Years since quitting: 50.6   Smokeless tobacco: Never  Vaping Use    Vaping status: Never Used  Substance and Sexual Activity   Alcohol use: No   Drug use: No   Sexual activity: Yes  Other Topics Concern   Not on file  Social History Narrative   Not on file   Social Determinants of Health   Financial Resource Strain: Low Risk  (07/27/2022)   Received from [facility]   Overall Financial Resource Strain (CARDIA)    Difficulty of Paying Living Expenses: Not hard at all  Food Insecurity: No Food Insecurity (07/27/2022)   Received from [facility]   Hunger Vital Sign    Worried About Running Out of Food in the Last Year: Never true    Ran Out of Food in the Last Year: Never true  Transportation Needs: No Transportation Needs (07/27/2022)   Received from [facility] - Transportation    Lack of Transportation (Medical): No    Lack of Transportation (Non-Medical): No  Physical Activity: Insufficiently Active (07/27/2022)   Received from [facility]   Exercise Vital Sign    Days of Exercise per Week: 1 day    Minutes of Exercise per Session: 10 min  Stress: No Stress Concern Present (07/27/2022)   Received from [facility] - Occupational Stress Questionnaire    Feeling of Stress : Not at all  Social Connections: Socially Integrated (07/27/2022)   Received from [facility]   Social Network    How would you rate your social network (family, work, friends)?: Good participation with social networks  Intimate Partner Violence: Not At Risk (07/27/2022)   Received from [facility]   HITS    Over the last 12 months how often did your partner physically hurt you?: 1    Over the last 12 months how often did your partner insult you or talk down to you?: 1    Over the last 12 months how often did your partner threaten you with physical harm?: 1    Over the last 12 months how often did your partner scream or curse at you?: 1     Code Status   Code Status: Full Code  Review of Systems: All  systems reviewed and negative except where noted in HPI.  Physical Exam: Vital signs in last 24 hours: Temp:  [96.7 F (35.9 C)-97 F (36.1 C)] 97 F (36.1 C) (08/08 1131)  Pulse Rate:  [91-106] 92 (08/08 1230) Resp:  [17-29] 22 (08/08 1230) BP: (89-110)/(53-73) 92/53 (08/08 1230) SpO2:  [96 %-100 %] 97 % (08/08 1230) Weight:  [72.6 kg] 72.6 kg (08/08 0800)    General:  Pleasant female in obvious physical discomfort holding belly Psych:  Cooperative. Normal mood and affect Eyes: Pupils equal Ears:  Normal auditory acuity Nose: No deformity, discharge or lesions Neck:  Supple, no masses felt Lungs:  Clear to auscultation.  Heart:  Regular rate, regular rhythm.  Abdomen:  Soft, mod distention, hypoactive bowel sounds, mild generalized tenderness.  Msk: Symmetrical without gross deformities.  Neurologic:  Alert, oriented, grossly normal neurologically Extremities : No edema Skin:  Intact without significant lesions.    Intake/Output from previous day: No intake/output data recorded. Intake/Output this shift:  No intake/output data recorded.  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

## 2022-09-09 NOTE — H&P (Signed)
History and Physical    [name] PIR:[id number] DOB: December 11, 1950 DOA: 09/09/2022  PCP: Lytle Michaels, PA-C   Patient coming from: Home  I have personally briefly reviewed patient's old medical records in [facility]  Chief Complaint: Severe constipation leading to abdominal pain nausea.  HPI: [name] is a 72 y.o. female with PMH significant for chronic constipation, fibromyalgia, depression, IBS, history of diverticulosis, chronic intermittent dysphagia, chronic back pain,  history of lumbar fusion and cervical fusion, chronic opioid intake presented in the ED with complaints of severe abdominal pain associated with distention for last few days.  Patient takes oxycodone for neck and back pain, Robinul for daily for IBS, iron supplementation.  She seems to manage her constipation with daily MiraLAX and have bowel movement every other day.  Recently she has not had a bowel movement in last 4 days despite taking MiraLAX and other bowel medications.  She has developed severe abdominal pain associated with nausea yesterday, she is not able to pass flatus.  She presented in the ED and was advised admission.  ED Course: She was hypotensive, hypothermic,  tachypneic. other vitals were stable. Temp 96.7 which improved to 97.4, HR 92, RR 22, BP 92/53, SpO2 100% on room air Labs include sodium 129, potassium 3.8, chloride 91, bicarb 20, glucose 178, BUN 18, creatinine 1.44, calcium 8.9, anion gap 18, magnesium 4.7, alkaline phosphatase 109, albumin 3.9, lipase 24, AST 157, ALT 104, total protein 7.3, total bilirubin 0.8, WBC 8.3, hemoglobin 15.2, hematocrit 47.9, platelet 367 CT abdomen and pelvis : Marked disproportionate dilation of the colon with large amount of fluid and stool within. Nondilated stomach and small bowel loops. No mechanical obstruction noted. 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  Review of Systems:   Review of Systems  Constitutional:  Negative.   HENT: Negative.    Eyes: Negative.   Respiratory: Negative.    Cardiovascular: Negative.   Gastrointestinal:  Positive for abdominal pain, constipation, heartburn, nausea and vomiting.  Genitourinary: Negative.   Musculoskeletal: Negative.   Skin: Negative.   Neurological: Negative.   Endo/Heme/Allergies: Negative.   Psychiatric/Behavioral:  Positive for depression.     Past Medical History:  Diagnosis Date   Anemia    Anxiety 2000   Arthritis    Depression 2000   Family history of adverse reaction to anesthesia    mother and sister have nausea after surgery   Fibromyalgia    GERD (gastroesophageal reflux disease)    Headache(784.0)    Pneumonia    PONV (postoperative nausea and vomiting)    Seasonal allergies     Past Surgical History:  Procedure Laterality Date   ABDOMINAL HYSTERECTOMY  1984   ANTERIOR CERVICAL DECOMP/DISCECTOMY FUSION N/A 10/16/2021   Procedure: Anterior Cervical Discectomy and Fusion Cervical Three-Four;  Surgeon: Tia Alert, MD;  Location: [facility] OR;  Service: Neurosurgery;  Laterality: N/A;  3C   BACK SURGERY  2006, 2008   lower back, fusion in l5-s1   CHOLECYSTECTOMY  2003   ERCP W/ SPHINCTEROTOMY AND BALLOON DILATION  2005   pancreatitis    LUMBAR LAMINECTOMY/DECOMPRESSION MICRODISCECTOMY N/A 05/31/2012   Procedure: MICRO LUMBAR DECOMPRESSION L4 - L5, L5 - S1 ;  Surgeon: Javier Docker, MD;  Location: [facility] ORS;  Service: Orthopedics;  Laterality: N/A;   LUMBAR LAMINECTOMY/DECOMPRESSION MICRODISCECTOMY Right 02/06/2014   Procedure: LUMBAR DECOMPRESSION L3-L4, REDO LUMBAR DECOMPRESSION L4-L5 ON THE RIGHT;  Surgeon: Javier Docker, MD;  Location: [facility] ORS;  Service:  Orthopedics;  Laterality: Right;   NECK SURGERY  2001,2007   herniated disc- cervical fusion(limited side to side mobility)   POSTERIOR CERVICAL FUSION/FORAMINOTOMY N/A 06/30/2022   Procedure: Posterior cervical fusion with lateral mass fixation Cervical three-cervical seven;   Surgeon: Tia Alert, MD;  Location: [facility] OR;  Service: Neurosurgery;  Laterality: N/A;   SHOULDER ARTHROSCOPY Bilateral 2010, 2011   debridement     reports that she quit smoking about 50 years ago. Her smoking use included cigarettes. She started smoking about 53 years ago. She has a 1.5 pack-year smoking history. She has never used smokeless tobacco. She reports that she does not drink alcohol and does not use drugs.  No Known Allergies  History reviewed. No pertinent family history. Family history reviewed and not pertinent.  Prior to Admission medications   Medication Sig Start Date End Date Taking? Authorizing Provider  acyclovir (ZOVIRAX) 200 MG capsule Take 200 mg by mouth 5 (five) times daily as needed (fever blisters). 10/07/21   [provider]  Ascorbic Acid (VITAMIN C) 1000 MG tablet Take 1,000 mg by mouth daily.    [provider]  Calcium Carb-Cholecalciferol (CALCIUM 600 + D PO) Take 1 tablet by mouth daily.    [provider]  cetirizine (ZYRTEC) 10 MG tablet Take 10 mg by mouth daily.    [provider]  Cholecalciferol (VITAMIN D) 125 MCG (5000 UT) CAPS Take 5,000 Units by mouth 2 (two) times daily.    [provider]  docusate sodium (COLACE) 100 MG capsule Take 1 capsule (100 mg total) by mouth 2 (two) times daily as needed for mild constipation. Patient taking differently: Take 100 mg by mouth daily. 02/06/14   Jene Every, MD  DULoxetine (CYMBALTA) 60 MG capsule Take 60 mg by mouth daily. 06/11/22   [provider]  ferrous sulfate ER (SLOW FE) 142 (45 Fe) MG TBCR tablet Take 45 mg by mouth daily.    [provider]  Ginkgo Biloba 60 MG TABS Take 60 mg by mouth in the morning and at bedtime.    [provider]  glycopyrrolate (ROBINUL) 2 MG tablet Take 2 mg by mouth 2 (two) times daily.    [provider]  lidocaine (LIDODERM) 5 % Place 1-3 patches onto the skin daily as needed (pain).  Remove & Discard patch within 12 hours or as directed by MD    [provider]  magnesium gluconate (MAGONATE) 500 MG tablet Take 500 mg by mouth daily.    [provider]  methocarbamol (ROBAXIN) 500 MG tablet Take 500 mg by mouth 3 (three) times daily as needed for muscle spasms.    [provider]  Multiple Vitamins-Minerals (MULTIVITAMIN PO) Take 1 tablet by mouth daily.    [provider]  naproxen sodium (ALEVE) 220 MG tablet Take 220 mg by mouth daily as needed (pain).    [provider]  Omega-3 Fatty Acids (FISH OIL PO) Take 1 capsule by mouth daily.    [provider]  ondansetron (ZOFRAN) 4 MG tablet Take 4 mg by mouth every 6 (six) hours as needed for nausea/vomiting. 05/02/21   [provider]  oxyCODONE-acetaminophen (PERCOCET) 10-325 MG tablet Take 1 tablet by mouth every 4 (four) hours as needed for pain. 06/18/20   Arman Bogus, MD  pantoprazole (PROTONIX) 40 MG tablet Take 40 mg by mouth 2 (two) times daily.     [provider]  Polyethyl Glycol-Propyl Glycol (SYSTANE ULTRA OP)  Place 1 drop into both eyes 2 (two) times daily as needed (dry eyes).    [provider]  polyethylene glycol (MIRALAX / GLYCOLAX) packet Take 17 g by mouth daily.    [provider]  Potassium 99 MG TABS Take 99 mg by mouth 2 (two) times daily.    [provider]  sodium chloride (OCEAN) 0.65 % SOLN nasal spray Place 1 spray into both nostrils as needed for congestion.    [provider]  TURMERIC CURCUMIN PO Take 1,000 mg by mouth daily.    [provider]  vitamin B-12 (CYANOCOBALAMIN) 1000 MCG tablet Take 1,000 mcg by mouth in the morning and at bedtime.    [provider]  zinc gluconate 50 MG tablet Take 50 mg by mouth daily.    [provider]    Physical Exam: Vitals:   09/09/22 1015 09/09/22 1131 09/09/22 1202 09/09/22 1230  BP: 98/65 98/65 (!) 89/66 (!) 92/53   Pulse: 100 91 93 92  Resp: 17 20 (!) 21 (!) 22  Temp:  (!) 97 F (36.1 C)    TempSrc:  Oral    SpO2: 98% 100% 99% 97%  Weight:      Height:        Constitutional: Appears in a lot of pain, deconditioned, crying in pain. Vitals:   09/09/22 1015 09/09/22 1131 09/09/22 1202 09/09/22 1230  BP: 98/65 98/65 (!) 89/66 (!) 92/53  Pulse: 100 91 93 92  Resp: 17 20 (!) 21 (!) 22  Temp:  (!) 97 F (36.1 C)    TempSrc:  Oral    SpO2: 98% 100% 99% 97%  Weight:      Height:       Eyes: PERRL, lids and conjunctivae normal ENMT: Mucous membranes are moist. Posterior pharynx clear of any exudate or lesions. Neck: normal, supple, no masses, no thyromegaly Respiratory: CTA bilaterally, no wheezing, no crackles. Normal respiratory effort. No accessory muscle use.  Cardiovascular: S1-S2 heard, regular rate and rhythm, no murmurs / rubs / gallops.  Abdomen: Soft, distended, firm, significant tenderness noted, bowel sounds absent  Musculoskeletal: no clubbing / cyanosis.  Good ROM, no contractures. Normal muscle tone.  Skin: no rashes, lesions, ulcers. No induration Neurologic: CN 2-12 grossly intact. Sensation intact, DTR normal. Strength 5/5 in all 4.  Psychiatric: Normal judgment and insight. Alert and oriented x 3. Normal mood.    Labs on Admission: I have personally reviewed following labs and imaging studies  CBC: Recent Labs  Lab 09/09/22 0813  WBC 8.3  NEUTROABS 6.8  HGB 15.2*  HCT 47.9*  MCV 95.2  PLT 367   Basic Metabolic Panel: Recent Labs  Lab 09/09/22 0813  NA 129*  K 3.8  CL 91*  CO2 20*  GLUCOSE 178*  BUN 18  CREATININE 1.44*  CALCIUM 8.9  MG 4.7*   GFR: Estimated Creatinine Clearance: 36.8 mL/min (A) (by C-G formula based on SCr of 1.44 mg/dL (H)). Liver Function Tests: Recent Labs  Lab 09/09/22 0813  AST 157*  ALT 104*  ALKPHOS 109  BILITOT 0.8  PROT 7.3  ALBUMIN 3.9   Recent Labs  Lab 09/09/22 0813  LIPASE 24   No results for input(s):  "AMMONIA" in the last 168 hours. Coagulation Profile: No results for input(s): "INR", "PROTIME" in the last 168 hours. Cardiac Enzymes: No results for input(s): "CKTOTAL", "CKMB", "CKMBINDEX", "TROPONINI" in the last 168 hours. BNP (last 3 results) No results for input(s): "PROBNP" in the  last 8760 hours. HbA1C: No results for input(s): "HGBA1C" in the last 72 hours. CBG: No results for input(s): "GLUCAP" in the last 168 hours. Lipid Profile: No results for input(s): "CHOL", "HDL", "LDLCALC", "TRIG", "CHOLHDL", "LDLDIRECT" in the last 72 hours. Thyroid Function Tests: No results for input(s): "TSH", "T4TOTAL", "FREET4", "T3FREE", "THYROIDAB" in the last 72 hours. Anemia Panel: No results for input(s): "VITAMINB12", "FOLATE", "FERRITIN", "TIBC", "IRON", "RETICCTPCT" in the last 72 hours. Urine analysis: No results found for: "COLORURINE", "APPEARANCEUR", "LABSPEC", "PHURINE", "GLUCOSEU", "HGBUR", "BILIRUBINUR", "KETONESUR", "PROTEINUR", "UROBILINOGEN", "NITRITE", "LEUKOCYTESUR"  Radiological Exams on Admission: CT ABDOMEN PELVIS W CONTRAST  Result Date: 09/09/2022 CLINICAL DATA:  Bowel obstruction suspected EXAM: CT ABDOMEN AND PELVIS WITH CONTRAST TECHNIQUE: Multidetector CT imaging of the abdomen and pelvis was performed using the standard protocol following bolus administration of intravenous contrast. RADIATION DOSE REDUCTION: This exam was performed according to the departmental dose-optimization program which includes automated exposure control, adjustment of the mA and/or kV according to patient size and/or use of iterative reconstruction technique. CONTRAST:  70mL OMNIPAQUE IOHEXOL 350 MG/ML SOLN COMPARISON:  None Available. FINDINGS: Lower chest: There are dependent and patchy atelectatic changes in the visualized lung bases. No overt consolidation. No pleural effusion. The heart is normal in size. No pericardial effusion. Hepatobiliary: The liver is normal in size. Non-cirrhotic  configuration. No suspicious mass. No intrahepatic or extrahepatic bile duct dilation. Gallbladder is surgically absent. Pancreas: Small/atrophic pancreas. No focal lesion. No pancreatic ductal dilatation or surrounding inflammatory changes. Spleen: Within normal limits. No focal lesion. Adrenals/Urinary Tract: Adrenal glands are unremarkable. No suspicious renal mass. No hydronephrosis. No renal or ureteric calculi. Unremarkable urinary bladder. Stomach/Bowel: Visualized lower thoracic esophagus is dilated and fluid-filled, which is nonspecific but most likely seen in the settings of chronic gastroesophageal reflux disease versus esophageal dysmotility. There is marked disproportionate dilation of the colon measuring up to 8.4 cm in diameter. There is large amount of fluid and stool within. Stomach and small bowel loops including appendix are nondilated. No evidence of abnormal bowel wall thickening or inflammatory changes. No pneumatosis or portal venous gas. The appendix is unremarkable. Vascular/Lymphatic: No ascites or pneumoperitoneum. No abdominal or pelvic lymphadenopathy, by size criteria. No aneurysmal dilation of the major abdominal arteries. There are mild peripheral atherosclerotic vascular calcifications of the aorta and its major branches. Reproductive: The uterus is surgically absent. No large adnexal mass. Other: There is a tiny fat containing umbilical hernia. The soft tissues and abdominal wall are otherwise unremarkable. Musculoskeletal: No suspicious osseous lesions. There are mild multilevel degenerative changes in the visualized spine. Posterior spinal fixation of L3 through S1 noted. IMPRESSION: *Marked disproportionate dilation of the colon with large amount of fluid and stool within. Nondilated stomach and small bowel loops. No mechanical obstruction noted. 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 *Multiple other nonacute observations, as described above.  Electronically Signed   By: Jules Schick M.D.   On: 09/09/2022 10:52   DG Abdomen Acute W/Chest  Result Date: 09/09/2022 CLINICAL DATA:  abdominal distension, eval for free air/evidence of perforation EXAM: DG ABDOMEN ACUTE WITH 1 VIEW CHEST COMPARISON:  06/13/2020 FINDINGS: There is no evidence of dilated bowel loops or free intraperitoneal air. No radiopaque calculi or other significant radiographic abnormality is seen. Heart size and mediastinal contours are within normal limits. Both lungs are clear. Partially seen lower cervical and lower lumbar spinal fixation hardware. IMPRESSION: Negative abdominal radiographs.  No acute cardiopulmonary disease. No free intraperitoneal air. Electronically Signed   By: Rhea Belton  Ramiro Harvest M.D.   On: 09/09/2022 08:47    EKG: Ordered.  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 CT shows marked disproportionate dilatation of colon.  This could be medication related. Gastroenterology is consulted.  No recent surgeries or illnesses.  No known neurological diseases. Patient seems in a lot of discomfort but remains afebrile with normal WBC count and no peritoneal signs. Continue NPO, IV fluids, IV pain control. No need for NG tube insertion at this time. Keep electrolytes within normal range. GI recommended smog enema.  If no improvement will try Mestinon.  Hyponatremia: Could be secondary to decreased oral intake. Continue IV fluids.  Continue to monitor serum sodium  Elevated liver enzymes: This could be reactive, liver appears normal  on CT scan. Continue to trend liver enzymes.  High anion gap acidosis: Likely due to above.  Continue IV hydration.  AKI: Baseline serum creatinine normal, creatinine on admission 1.44 likely due to dehydration. Continue IV hydration, avoid nephrotoxic medications, monitor serum creatinine  Chronic back pain: Patient takes chronic opioids Continue Dilaudid as needed for pain control.  GERD /chronic intermittent dysphagia: Patient underwent dilatation. Continue pantoprazole 40 mg daily.   DVT prophylaxis: Lovenox Code Status: Full code Family Communication: No family at bed side. Disposition Plan:  Status is: Inpatient Remains inpatient appropriate because: Noted for suspected Ogilvie syndrome due to severe constipation,  also found to have electrolyte abnormalities hyponatremia,  hypokalemia,  high anion gap requiring IV hydration.  GI is consulted   Consults called:  [place] consulted Admission status: Inpatient   Willeen Niece MD [facility]   If 7PM-7AM, please contact night-coverage   09/09/2022, 2:50 PM

## 2022-09-09 NOTE — Progress Notes (Signed)
   09/09/22 1535  Assess: MEWS Score  Temp (!) 97.5 F (36.4 C)  BP 90/61  MAP (mmHg) 71  Pulse Rate (!) 103  Resp 20  SpO2 94 %  Assess: MEWS Score  MEWS Temp 0  MEWS Systolic 1  MEWS Pulse 1  MEWS RR 0  MEWS LOC 0  MEWS Score 2  MEWS Score Color Yellow  Assess: if the MEWS score is Yellow or Red  Were vital signs accurate and taken at a resting state? Yes  Does the patient meet 2 or more of the SIRS criteria? No  MEWS guidelines implemented  Yes, yellow  Treat  MEWS Interventions Considered administering scheduled or prn medications/treatments as ordered  Take Vital Signs  Increase Vital Sign Frequency  Yellow: Q2hr x1, continue Q4hrs until patient remains green for 12hrs  Escalate  MEWS: Escalate Yellow: Discuss with charge nurse and consider notifying provider and/or RRT  Notify: Charge Nurse/RN  Name of Charge Nurse/RN Notified Evette, RN  Provider Notification  Provider Name/Title Dr. Willeen Niece  Date Provider Notified 09/09/22  Time Provider Notified 1540  Method of Notification Page  Notification Reason Other (Comment) (Yellow MEWS)  Provider response See new orders  Date of Provider Response 09/09/22  Time of Provider Response 1540  Assess: SIRS CRITERIA  SIRS Temperature  0  SIRS Pulse 1  SIRS Respirations  0  SIRS WBC 0  SIRS Score Sum  1

## 2022-09-09 NOTE — ED Provider Notes (Signed)
for pain. 06/18/20   Arman Bogus, MD  pantoprazole (PROTONIX) 40 MG tablet Take 40 mg by mouth 2 (two) times daily.     [provider]  Polyethyl Glycol-Propyl Glycol (SYSTANE ULTRA OP) Place 1 drop into both eyes 2 (two) times daily as needed (dry eyes).    [provider]  polyethylene glycol (MIRALAX / GLYCOLAX) packet Take 17 g by mouth daily.    [provider]  Potassium 99 MG TABS Take 99 mg by mouth 2 (two) times daily.    [provider]  sodium chloride (OCEAN) 0.65 % SOLN nasal spray Place 1 spray into both nostrils as needed for congestion.    [provider]  TURMERIC CURCUMIN PO Take 1,000 mg by mouth daily.    [provider]  vitamin B-12 (CYANOCOBALAMIN) 1000 MCG tablet Take 1,000 mcg by mouth in the morning and at bedtime.    [provider]  zinc gluconate 50 MG tablet Take 50 mg by mouth daily.    [provider]      Allergies    Patient has no known allergies.    Review of Systems   Review of Systems  Physical Exam Updated Vital Signs BP (!) 89/66   Pulse 93   Temp (!) 97 F (36.1 C) (Oral)   Resp (!) 21   Ht 5\' 5"  (1.651 m)   Wt 72.6 kg   SpO2 99%   BMI 26.63 kg/m  Physical Exam Constitutional:      General: She is not in acute distress. HENT:     Head: Normocephalic and atraumatic.  Eyes:     Conjunctiva/sclera: Conjunctivae normal.     Pupils: Pupils are equal, round, and reactive to light.  Cardiovascular:     Rate and Rhythm: Normal rate and regular rhythm.  Pulmonary:     Effort: Pulmonary effort is normal. No respiratory distress.  Abdominal:     General: There is distension.     Tenderness: There is generalized abdominal tenderness. There is guarding.  Skin:    General: Skin is warm and dry.  Neurological:     General: No focal deficit present.     Mental Status: She is alert. Mental status is at baseline.  Psychiatric:        Mood and Affect: Mood normal.        Behavior: Behavior normal.     ED Results / Procedures / Treatments   Labs (all labs ordered are listed, but only abnormal results are displayed) Labs Reviewed  COMPREHENSIVE METABOLIC PANEL - Abnormal; Notable for the following components:      Result Value   Sodium 129 (*)    Chloride 91 (*)    CO2 20 (*)    Glucose, Bld 178 (*)    Creatinine, Ser 1.44 (*)    AST 157 (*)    ALT 104 (*)    GFR, Estimated 39 (*)    Anion gap 18 (*)    All other components within normal limits  CBC WITH DIFFERENTIAL/PLATELET - Abnormal; Notable for the following components:   Hemoglobin 15.2 (*)    HCT 47.9 (*)    All other components within normal limits  MAGNESIUM - Abnormal;  Notable for the following components:   Magnesium 4.7 (*)    All other components within normal limits  LIPASE, BLOOD    EKG None  Radiology CT ABDOMEN PELVIS W CONTRAST  Result Date: 09/23/2022 CLINICAL DATA:  Bowel  Course/ Medical Decision Making/ A&P Clinical Course as of 09/26/2022 1253  Thu Sep 09, 2022  1108 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  Will consult with gastroenterology [MT]  1227 Gi consulted - will admit to medicine.  Pt reports improvement of nausea but still having abdominal pain.  Additional 1 mg dilaudid ordered [MT]  1253 Admitted to hospitalist [MT]    Clinical Course User Index [MT] , Kermit Balo, MD                                 Medical Decision Making Amount and/or Complexity of Data Reviewed Labs: ordered. Radiology: ordered.  Risk Prescription drug management. Decision regarding hospitalization.   This patient presents to the ED with concern for significant abdominal pain and distention.. This involves an extensive number of treatment options, and is a complaint that carries with it a high risk of complications and morbidity.  The differential diagnosis includes bowel obstruction versus ileus versus perforation versus other  Co-morbidities that complicate the patient evaluation: History of chronic opioid use at high risk of ileus versus obstruction  Additional history obtained from patient's husband at the bedside  External records from outside source obtained and reviewed including PDMP reviewed, patient is prescribed oxycodone 10 mg tablets which she takes regularly  I ordered and personally interpreted labs.  The pertinent results include: Hyponatremia sodium 129.  Creatinine 1.44.   No leukocytosis.  I ordered imaging studies including x-ray of the chest and abdomen, CT of the abdomen I independently visualized and interpreted imaging which showed likely lower lobe atelectasis.  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  I personally viewed and interpreted the cardiac monitored which showed an underlying rhythm of: Sinus rhythm  I ordered medication including IV pain and nausea medication  I have reviewed the patients home medicines and have made adjustments as needed   I requested consultation with the gastroenterology,  and discussed lab and imaging findings as well as pertinent plan - they recommend: Admission to medical service, they will consult on the patient regarding potential intervention for Ogilvie.  No recommendation for NG tube at this time, given that the patient is not having active vomiting or nausea, GI consulted by phone did not feel this was emergently necessary.  After the interventions noted above, I reevaluated the patient and found that they have: stayed the same   Dispostion:  After consideration of the diagnostic results and the patients response to treatment, I feel that the patent would benefit from hospitalization         Final Clinical Impression(s) / ED Diagnoses Final diagnoses:  Ogilvie syndrome  Hyponatremia  Abdominal pain, unspecified abdominal location    Rx / DC Orders ED Discharge Orders     None         , Kermit Balo, MD 09/29/2022 1254  for pain. 06/18/20   Arman Bogus, MD  pantoprazole (PROTONIX) 40 MG tablet Take 40 mg by mouth 2 (two) times daily.     [provider]  Polyethyl Glycol-Propyl Glycol (SYSTANE ULTRA OP) Place 1 drop into both eyes 2 (two) times daily as needed (dry eyes).    [provider]  polyethylene glycol (MIRALAX / GLYCOLAX) packet Take 17 g by mouth daily.    [provider]  Potassium 99 MG TABS Take 99 mg by mouth 2 (two) times daily.    [provider]  sodium chloride (OCEAN) 0.65 % SOLN nasal spray Place 1 spray into both nostrils as needed for congestion.    [provider]  TURMERIC CURCUMIN PO Take 1,000 mg by mouth daily.    [provider]  vitamin B-12 (CYANOCOBALAMIN) 1000 MCG tablet Take 1,000 mcg by mouth in the morning and at bedtime.    [provider]  zinc gluconate 50 MG tablet Take 50 mg by mouth daily.    [provider]      Allergies    Patient has no known allergies.    Review of Systems   Review of Systems  Physical Exam Updated Vital Signs BP (!) 89/66   Pulse 93   Temp (!) 97 F (36.1 C) (Oral)   Resp (!) 21   Ht 5\' 5"  (1.651 m)   Wt 72.6 kg   SpO2 99%   BMI 26.63 kg/m  Physical Exam Constitutional:      General: She is not in acute distress. HENT:     Head: Normocephalic and atraumatic.  Eyes:     Conjunctiva/sclera: Conjunctivae normal.     Pupils: Pupils are equal, round, and reactive to light.  Cardiovascular:     Rate and Rhythm: Normal rate and regular rhythm.  Pulmonary:     Effort: Pulmonary effort is normal. No respiratory distress.  Abdominal:     General: There is distension.     Tenderness: There is generalized abdominal tenderness. There is guarding.  Skin:    General: Skin is warm and dry.  Neurological:     General: No focal deficit present.     Mental Status: She is alert. Mental status is at baseline.  Psychiatric:        Mood and Affect: Mood normal.        Behavior: Behavior normal.     ED Results / Procedures / Treatments   Labs (all labs ordered are listed, but only abnormal results are displayed) Labs Reviewed  COMPREHENSIVE METABOLIC PANEL - Abnormal; Notable for the following components:      Result Value   Sodium 129 (*)    Chloride 91 (*)    CO2 20 (*)    Glucose, Bld 178 (*)    Creatinine, Ser 1.44 (*)    AST 157 (*)    ALT 104 (*)    GFR, Estimated 39 (*)    Anion gap 18 (*)    All other components within normal limits  CBC WITH DIFFERENTIAL/PLATELET - Abnormal; Notable for the following components:   Hemoglobin 15.2 (*)    HCT 47.9 (*)    All other components within normal limits  MAGNESIUM - Abnormal;  Notable for the following components:   Magnesium 4.7 (*)    All other components within normal limits  LIPASE, BLOOD    EKG None  Radiology CT ABDOMEN PELVIS W CONTRAST  Result Date: 09/23/2022 CLINICAL DATA:  Bowel  Course/ Medical Decision Making/ A&P Clinical Course as of 09/26/2022 1253  Thu Sep 09, 2022  1108 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  Will consult with gastroenterology [MT]  1227 Gi consulted - will admit to medicine.  Pt reports improvement of nausea but still having abdominal pain.  Additional 1 mg dilaudid ordered [MT]  1253 Admitted to hospitalist [MT]    Clinical Course User Index [MT] , Kermit Balo, MD                                 Medical Decision Making Amount and/or Complexity of Data Reviewed Labs: ordered. Radiology: ordered.  Risk Prescription drug management. Decision regarding hospitalization.   This patient presents to the ED with concern for significant abdominal pain and distention.. This involves an extensive number of treatment options, and is a complaint that carries with it a high risk of complications and morbidity.  The differential diagnosis includes bowel obstruction versus ileus versus perforation versus other  Co-morbidities that complicate the patient evaluation: History of chronic opioid use at high risk of ileus versus obstruction  Additional history obtained from patient's husband at the bedside  External records from outside source obtained and reviewed including PDMP reviewed, patient is prescribed oxycodone 10 mg tablets which she takes regularly  I ordered and personally interpreted labs.  The pertinent results include: Hyponatremia sodium 129.  Creatinine 1.44.   No leukocytosis.  I ordered imaging studies including x-ray of the chest and abdomen, CT of the abdomen I independently visualized and interpreted imaging which showed likely lower lobe atelectasis.  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  I personally viewed and interpreted the cardiac monitored which showed an underlying rhythm of: Sinus rhythm  I ordered medication including IV pain and nausea medication  I have reviewed the patients home medicines and have made adjustments as needed   I requested consultation with the gastroenterology,  and discussed lab and imaging findings as well as pertinent plan - they recommend: Admission to medical service, they will consult on the patient regarding potential intervention for Ogilvie.  No recommendation for NG tube at this time, given that the patient is not having active vomiting or nausea, GI consulted by phone did not feel this was emergently necessary.  After the interventions noted above, I reevaluated the patient and found that they have: stayed the same   Dispostion:  After consideration of the diagnostic results and the patients response to treatment, I feel that the patent would benefit from hospitalization         Final Clinical Impression(s) / ED Diagnoses Final diagnoses:  Ogilvie syndrome  Hyponatremia  Abdominal pain, unspecified abdominal location    Rx / DC Orders ED Discharge Orders     None         , Kermit Balo, MD 09/29/2022 1254

## 2022-09-09 NOTE — Plan of Care (Signed)
  Problem: Education: Goal: Knowledge of General Education information will improve Description: Including pain rating scale, medication(s)/side effects and non-pharmacologic comfort measures Outcome: Progressing   Problem: Health Behavior/Discharge Planning: Goal: Ability to manage health-related needs will improve Outcome: Progressing   Problem: Clinical Measurements: Goal: Ability to maintain clinical measurements within normal limits will improve Outcome: Progressing Goal: Will remain free from infection Outcome: Progressing Goal: Diagnostic test results will improve Outcome: Progressing   Problem: Elimination: Goal: Will not experience complications related to urinary retention Outcome: Progressing   Problem: Pain Managment: Goal: General experience of comfort will improve Outcome: Progressing   Problem: Safety: Goal: Ability to remain free from injury will improve Outcome: Progressing

## 2022-09-10 ENCOUNTER — Inpatient Hospital Stay (HOSPITAL_COMMUNITY): Payer: Medicare Other | Admitting: Anesthesiology

## 2022-09-10 ENCOUNTER — Inpatient Hospital Stay (HOSPITAL_COMMUNITY): Payer: Medicare Other

## 2022-09-10 DIAGNOSIS — R109 Unspecified abdominal pain: Secondary | ICD-10-CM | POA: Diagnosis not present

## 2022-09-10 DIAGNOSIS — R0689 Other abnormalities of breathing: Secondary | ICD-10-CM

## 2022-09-10 DIAGNOSIS — I469 Cardiac arrest, cause unspecified: Secondary | ICD-10-CM | POA: Diagnosis not present

## 2022-09-10 DIAGNOSIS — K5981 Ogilvie syndrome: Secondary | ICD-10-CM | POA: Diagnosis not present

## 2022-09-10 DIAGNOSIS — E872 Acidosis, unspecified: Secondary | ICD-10-CM | POA: Diagnosis not present

## 2022-09-10 LAB — COMPREHENSIVE METABOLIC PANEL
ALT: 455 U/L — ABNORMAL HIGH (ref 0–44)
AST: 472 U/L — ABNORMAL HIGH (ref 15–41)
Albumin: 2 g/dL — ABNORMAL LOW (ref 3.5–5.0)
Alkaline Phosphatase: 80 U/L (ref 38–126)
Anion gap: 19 — ABNORMAL HIGH (ref 5–15)
BUN: 32 mg/dL — ABNORMAL HIGH (ref 8–23)
CO2: 11 mmol/L — ABNORMAL LOW (ref 22–32)
Calcium: 6.2 mg/dL — CL (ref 8.9–10.3)
Chloride: 102 mmol/L (ref 98–111)
Creatinine, Ser: 2.14 mg/dL — ABNORMAL HIGH (ref 0.44–1.00)
GFR, Estimated: 24 mL/min — ABNORMAL LOW (ref >60)
Glucose, Bld: 102 mg/dL — ABNORMAL HIGH (ref 70–99)
Potassium: 3.2 mmol/L — ABNORMAL LOW (ref 3.5–5.1)
Sodium: 132 mmol/L — ABNORMAL LOW (ref 135–145)
Total Bilirubin: 1.1 mg/dL (ref 0.3–1.2)
Total Protein: 3.9 g/dL — ABNORMAL LOW (ref 6.5–8.1)

## 2022-09-10 LAB — BASIC METABOLIC PANEL
Anion gap: 20 — ABNORMAL HIGH (ref 5–15)
BUN: 34 mg/dL — ABNORMAL HIGH (ref 8–23)
CO2: 11 mmol/L — ABNORMAL LOW (ref 22–32)
Calcium: 8.1 mg/dL — ABNORMAL LOW (ref 8.9–10.3)
Chloride: 99 mmol/L (ref 98–111)
Creatinine, Ser: 2.08 mg/dL — ABNORMAL HIGH (ref 0.44–1.00)
GFR, Estimated: 25 mL/min — ABNORMAL LOW (ref >60)
Glucose, Bld: 147 mg/dL — ABNORMAL HIGH (ref 70–99)
Potassium: 4.7 mmol/L (ref 3.5–5.1)
Sodium: 130 mmol/L — ABNORMAL LOW (ref 135–145)

## 2022-09-10 LAB — CULTURE, BLOOD (ROUTINE X 2)
Culture: NO GROWTH
Culture: NO GROWTH
Special Requests: ADEQUATE
Special Requests: ADEQUATE

## 2022-09-10 LAB — CBC
HCT: 32.2 % — ABNORMAL LOW (ref 36.0–46.0)
Hemoglobin: 9.9 g/dL — ABNORMAL LOW (ref 12.0–15.0)
MCH: 30.6 pg (ref 26.0–34.0)
MCHC: 30.7 g/dL (ref 30.0–36.0)
MCV: 99.4 fL (ref 80.0–100.0)
Platelets: 238 10*3/uL (ref 150–400)
RBC: 3.24 MIL/uL — ABNORMAL LOW (ref 3.87–5.11)
RDW: 13.2 % (ref 11.5–15.5)
WBC: 5.2 10*3/uL (ref 4.0–10.5)
nRBC: 0 % (ref 0.0–0.2)

## 2022-09-10 LAB — GLUCOSE, CAPILLARY
Glucose-Capillary: 110 mg/dL — ABNORMAL HIGH (ref 70–99)
Glucose-Capillary: 152 mg/dL — ABNORMAL HIGH (ref 70–99)
Glucose-Capillary: 106 mg/dL — ABNORMAL HIGH (ref 70–99)
Glucose-Capillary: 70 mg/dL (ref 70–99)

## 2022-09-10 LAB — POCT I-STAT 7, (LYTES, BLD GAS, ICA,H+H)
Acid-base deficit: 22 mmol/L — ABNORMAL HIGH (ref 0.0–2.0)
Acid-base deficit: 19 mmol/L — ABNORMAL HIGH (ref 0.0–2.0)
Acid-base deficit: 21 mmol/L — ABNORMAL HIGH (ref 0.0–2.0)
Bicarbonate: 10.5 mmol/L — ABNORMAL LOW (ref 20.0–28.0)
Bicarbonate: 10.5 mmol/L — ABNORMAL LOW (ref 20.0–28.0)
Bicarbonate: 8.6 mmol/L — ABNORMAL LOW (ref 20.0–28.0)
Calcium, Ion: 0.95 mmol/L — ABNORMAL LOW (ref 1.15–1.40)
Calcium, Ion: 1.08 mmol/L — ABNORMAL LOW (ref 1.15–1.40)
Calcium, Ion: 1.23 mmol/L (ref 1.15–1.40)
HCT: 31 % — ABNORMAL LOW (ref 36.0–46.0)
HCT: 32 % — ABNORMAL LOW (ref 36.0–46.0)
HCT: 25 % — ABNORMAL LOW (ref 36.0–46.0)
Hemoglobin: 10.5 g/dL — ABNORMAL LOW (ref 12.0–15.0)
Hemoglobin: 10.9 g/dL — ABNORMAL LOW (ref 12.0–15.0)
Hemoglobin: 8.5 g/dL — ABNORMAL LOW (ref 12.0–15.0)
O2 Saturation: 82 %
O2 Saturation: 97 %
O2 Saturation: 96 %
Patient temperature: 97.7
Patient temperature: 93.9
Patient temperature: 95.9
Potassium: 3.6 mmol/L (ref 3.5–5.1)
Potassium: 4.1 mmol/L (ref 3.5–5.1)
Potassium: 5.9 mmol/L — ABNORMAL HIGH (ref 3.5–5.1)
Sodium: 131 mmol/L — ABNORMAL LOW (ref 135–145)
Sodium: 129 mmol/L — ABNORMAL LOW (ref 135–145)
Sodium: 130 mmol/L — ABNORMAL LOW (ref 135–145)
TCO2: 12 mmol/L — ABNORMAL LOW (ref 22–32)
TCO2: 12 mmol/L — ABNORMAL LOW (ref 22–32)
TCO2: 10 mmol/L — ABNORMAL LOW (ref 22–32)
pCO2 arterial: 52.1 mmHg — ABNORMAL HIGH (ref 32–48)
pCO2 arterial: 32.6 mmHg (ref 32–48)
pCO2 arterial: 32.5 mmHg (ref 32–48)
pH, Arterial: 6.907 — CL (ref 7.35–7.45)
pH, Arterial: 7.1 — CL (ref 7.35–7.45)
pH, Arterial: 7.018 — CL (ref 7.35–7.45)
pO2, Arterial: 74 mmHg — ABNORMAL LOW (ref 83–108)
pO2, Arterial: 115 mmHg — ABNORMAL HIGH (ref 83–108)
pO2, Arterial: 112 mmHg — ABNORMAL HIGH (ref 83–108)

## 2022-09-10 LAB — MRSA NEXT GEN BY PCR, NASAL: MRSA by PCR Next Gen: NOT DETECTED

## 2022-09-10 LAB — TYPE AND SCREEN
ABO/RH(D): A POS
Antibody Screen: NEGATIVE

## 2022-09-10 LAB — TROPONIN I (HIGH SENSITIVITY)
Troponin I (High Sensitivity): 13 ng/L (ref <18)
Troponin I (High Sensitivity): 17 ng/L (ref <18)

## 2022-09-10 LAB — LACTIC ACID, PLASMA
Lactic Acid, Venous: >9 mmol/L (ref 0.5–1.9)
Lactic Acid, Venous: >9 mmol/L (ref 0.5–1.9)

## 2022-09-10 LAB — HEMOGLOBIN A1C
Hgb A1c MFr Bld: 5.6 % (ref 4.8–5.6)
Mean Plasma Glucose: 114.02 mg/dL

## 2022-09-10 LAB — CG4 I-STAT (LACTIC ACID): Lactic Acid, Venous: >15 mmol/L (ref 0.5–1.9)

## 2022-09-10 MED ORDER — SODIUM CHLORIDE 0.9 % IV SOLN
250.0000 mL | INTRAVENOUS | Status: DC
  Administered 2022-09-10: 250 mL via INTRAVENOUS

## 2022-09-10 MED ORDER — SODIUM BICARBONATE 8.4 % IV SOLN
100.0000 meq | Freq: Once | INTRAVENOUS | Status: AC
  Administered 2022-09-10: 100 meq via INTRAVENOUS

## 2022-09-10 MED ORDER — SODIUM CHLORIDE 0.9 % IV SOLN: 4.0000 g | Freq: Once | INTRAVENOUS | Status: DC

## 2022-09-10 MED ORDER — SODIUM BICARBONATE 8.4 % IV SOLN
INTRAVENOUS | Status: DC
  Filled 2022-09-10: qty 1000
  Filled 2022-09-10: qty 150

## 2022-09-10 MED ORDER — NOREPINEPHRINE 4 MG/250ML-% IV SOLN
INTRAVENOUS | Status: AC
  Administered 2022-09-10: 5 ug/min via INTRAVENOUS
  Filled 2022-09-10: qty 250

## 2022-09-10 MED ORDER — SODIUM BICARBONATE 8.4 % IV SOLN
INTRAVENOUS | Status: AC
  Filled 2022-09-10: qty 100

## 2022-09-10 MED ORDER — SODIUM CHLORIDE 0.9 % IV BOLUS
1000.0000 mL | Freq: Once | INTRAVENOUS | Status: AC
  Administered 2022-09-10: 1000 mL via INTRAVENOUS

## 2022-09-10 MED ORDER — ACETAMINOPHEN 325 MG PO TABS: 650.0000 mg | ORAL_TABLET | Freq: Four times a day (QID) | ORAL | Status: DC | PRN

## 2022-09-10 MED ORDER — SODIUM BICARBONATE 8.4 % IV SOLN
150.0000 meq | Freq: Once | INTRAVENOUS | Status: AC
  Administered 2022-09-10: 150 meq via INTRAVENOUS

## 2022-09-10 MED ORDER — INSULIN ASPART 100 UNIT/ML IJ SOLN: 0.0000 [IU] | INTRAMUSCULAR | Status: DC

## 2022-09-10 MED ORDER — PIPERACILLIN-TAZOBACTAM 3.375 G IVPB
3.3750 g | Freq: Three times a day (TID) | INTRAVENOUS | Status: DC
  Administered 2022-09-10: 3.375 g via INTRAVENOUS
  Filled 2022-09-10: qty 50

## 2022-09-10 MED ORDER — CALCIUM GLUCONATE-NACL 2-0.675 GM/100ML-% IV SOLN
2.0000 g | Freq: Once | INTRAVENOUS | Status: AC
  Administered 2022-09-10: 2000 mg via INTRAVENOUS
  Filled 2022-09-10: qty 100

## 2022-09-10 MED ORDER — NOREPINEPHRINE 4 MG/250ML-% IV SOLN
0.0000 ug/min | INTRAVENOUS | Status: DC
  Administered 2022-09-10: 30 ug/min via INTRAVENOUS
  Administered 2022-09-10: 8 ug/min via INTRAVENOUS
  Filled 2022-09-10: qty 250

## 2022-09-10 MED ORDER — MORPHINE 100MG IN NS 100ML (1MG/ML) PREMIX INFUSION
0.0000 mg/h | INTRAVENOUS | Status: DC
  Administered 2022-09-10: 5 mg/h via INTRAVENOUS
  Filled 2022-09-10: qty 100

## 2022-09-10 MED ORDER — GLYCOPYRROLATE 0.2 MG/ML IJ SOLN: 0.2000 mg | INTRAMUSCULAR | Status: DC | PRN

## 2022-09-10 MED ORDER — HYDROMORPHONE HCL 1 MG/ML IJ SOLN: 1.0000 mg | Freq: Once | INTRAMUSCULAR | Status: DC

## 2022-09-10 MED ORDER — PANTOPRAZOLE SODIUM 40 MG IV SOLR: 40.0000 mg | Freq: Every day | INTRAVENOUS | Status: DC

## 2022-09-10 MED ORDER — ORAL CARE MOUTH RINSE: 15.0000 mL | OROMUCOSAL | Status: DC | PRN

## 2022-09-10 MED ORDER — CALCIUM GLUCONATE-NACL 1-0.675 GM/50ML-% IV SOLN
1.0000 g | Freq: Once | INTRAVENOUS | Status: AC
  Administered 2022-09-10: 1000 mg via INTRAVENOUS
  Filled 2022-09-10: qty 50

## 2022-09-10 MED ORDER — CHLORHEXIDINE GLUCONATE CLOTH 2 % EX PADS: 6.0000 | MEDICATED_PAD | Freq: Every day | CUTANEOUS | Status: DC

## 2022-09-10 MED ORDER — FENTANYL CITRATE PF 50 MCG/ML IJ SOSY: 25.0000 ug | PREFILLED_SYRINGE | INTRAMUSCULAR | Status: DC | PRN

## 2022-09-10 MED ORDER — GLYCOPYRROLATE 1 MG PO TABS: 1.0000 mg | ORAL_TABLET | ORAL | Status: DC | PRN

## 2022-09-10 MED ORDER — SODIUM BICARBONATE 8.4 % IV SOLN
INTRAVENOUS | Status: AC
  Filled 2022-09-10: qty 150

## 2022-09-10 MED ORDER — ACETAMINOPHEN 650 MG RE SUPP: 650.0000 mg | Freq: Four times a day (QID) | RECTAL | Status: DC | PRN

## 2022-09-10 MED ORDER — SODIUM BICARBONATE 8.4 % IV SOLN: 150.0000 meq | Freq: Once | INTRAVENOUS | Status: AC

## 2022-09-10 MED ORDER — MORPHINE BOLUS VIA INFUSION
5.0000 mg | INTRAVENOUS | Status: DC | PRN
  Administered 2022-09-10 (×2): 5 mg via INTRAVENOUS

## 2022-09-10 MED ORDER — DOCUSATE SODIUM 50 MG/5ML PO LIQD: 100.0000 mg | Freq: Two times a day (BID) | ORAL | Status: DC

## 2022-09-10 MED ORDER — SODIUM BICARBONATE 8.4 % IV SOLN: 100.0000 meq | Freq: Once | INTRAVENOUS | Status: DC

## 2022-09-10 MED ORDER — SODIUM CHLORIDE 0.9 % IV SOLN: INTRAVENOUS | Status: DC | PRN

## 2022-09-10 MED ORDER — POLYETHYLENE GLYCOL 3350 17 G PO PACK: 17.0000 g | PACK | Freq: Every day | ORAL | Status: DC

## 2022-09-10 MED ORDER — POLYVINYL ALCOHOL 1.4 % OP SOLN: 1.0000 [drp] | Freq: Four times a day (QID) | OPHTHALMIC | Status: DC | PRN

## 2022-09-10 MED ORDER — SODIUM CHLORIDE 0.9 % IV SOLN: INTRAVENOUS | Status: DC

## 2022-09-10 MED ORDER — MIDAZOLAM HCL 2 MG/2ML IJ SOLN: 1.0000 mg | INTRAMUSCULAR | Status: DC | PRN

## 2022-09-10 MED ORDER — VASOPRESSIN 20 UNITS/100 ML INFUSION FOR SHOCK
INTRAVENOUS | Status: AC
  Administered 2022-09-10: 0.03 [IU]/min via INTRAVENOUS
  Filled 2022-09-10: qty 100

## 2022-09-10 MED ORDER — NOREPINEPHRINE 4 MG/250ML-% IV SOLN
2.0000 ug/min | INTRAVENOUS | Status: DC
  Administered 2022-09-10: 35 ug/min via INTRAVENOUS
  Filled 2022-09-10: qty 250

## 2022-09-10 MED ORDER — VASOPRESSIN 20 UNITS/100 ML INFUSION FOR SHOCK: 0.0000 [IU]/min | INTRAVENOUS | Status: DC

## 2022-09-10 MED ORDER — CALCIUM GLUCONATE-NACL 2-0.675 GM/100ML-% IV SOLN
2.0000 g | INTRAVENOUS | Status: AC
  Administered 2022-09-10 (×2): 2000 mg via INTRAVENOUS
  Filled 2022-09-10 (×2): qty 100

## 2022-09-10 MED ORDER — FAMOTIDINE IN NACL 20-0.9 MG/50ML-% IV SOLN: 20.0000 mg | Freq: Every day | INTRAVENOUS | Status: DC

## 2022-09-10 MED ORDER — POTASSIUM CHLORIDE 10 MEQ/100ML IV SOLN: 10.0000 meq | INTRAVENOUS | Status: DC

## 2022-09-10 MED ORDER — ORAL CARE MOUTH RINSE: 15.0000 mL | OROMUCOSAL | Status: DC

## 2022-09-10 MED ORDER — MIDAZOLAM HCL 2 MG/2ML IJ SOLN
2.0000 mg | INTRAMUSCULAR | Status: DC | PRN
  Filled 2022-09-10: qty 4

## 2022-09-10 MED ORDER — POTASSIUM CHLORIDE 10 MEQ/50ML IV SOLN
10.0000 meq | INTRAVENOUS | Status: AC
  Administered 2022-09-10 (×3): 10 meq via INTRAVENOUS
  Filled 2022-09-10: qty 50

## 2022-09-10 MED ORDER — ORAL CARE MOUTH RINSE
15.0000 mL | OROMUCOSAL | Status: DC
  Administered 2022-09-10 (×3): 15 mL via OROMUCOSAL

## 2022-10-03 NOTE — Procedures (Signed)
Arterial Catheter Insertion Procedure Note  [name]  [id number]  27-Jun-1950  Date:09/04/2022  Time:2:30 AM    Provider Performing: Rutherford Guys    Procedure: Insertion of Arterial Line (47425) with US guidance (95638)   Indication(s) Blood pressure monitoring and/or need for frequent ABGs  Consent Unable to obtain consent due to emergent nature of procedure.  Anesthesia None   Time Out Verified patient identification, verified procedure, site/side was marked, verified correct patient position, special equipment/implants available, medications/allergies/relevant history reviewed, required imaging and test results available.   Sterile Technique Maximal sterile technique including full sterile barrier drape, hand hygiene, sterile gown, sterile gloves, mask, hair covering, sterile ultrasound probe cover (if used).   Procedure Description Area of catheter insertion was cleaned with chlorhexidine and draped in sterile fashion. With real-time ultrasound guidance an arterial catheter was placed into the right femoral artery.  Appropriate arterial tracings confirmed on monitor.     Complications/Tolerance None; patient tolerated the procedure well.   EBL Minimal   Specimen(s) None   Rutherford Guys, PA - [facility] For pager details, please see AMION or use Epic chat  After 1900, please call [facility] for cross coverage needs 09/26/2022, 2:31 AM

## 2022-10-03 NOTE — Progress Notes (Signed)
Responded to Code Blue Pt. unresponsive. BVM initiated. Intubated with a 7.0 ETT. Color change confirmed on Colorimetric and Bilateral breath sounds heard upon auscultation. Pt. transported to 2H.

## 2022-10-03 NOTE — Hospital Course (Signed)
[id number] 

## 2022-10-03 NOTE — Progress Notes (Signed)
Patient received in severe pain. 7/10.Gave her pain med  Dilaudid at 2030 and was not relieved.Gave her another Dilaudid. at 2330.Was not relieved and referred to the doctor [facility].While waiting for the new order, I was at another patient and my charge nurse was at the hallway and heard her IV beep.She was unresponsive upon entering room.Doctor notified. Code blue.

## 2022-10-03 NOTE — Progress Notes (Signed)
   09/07/2022 0100  Spiritual Encounters  Type of Visit Initial  Care provided to: Patient  Conversation partners present during encounter Nurse  Referral source Code page  Reason for visit Code  OnCall Visit Yes   Ch responded to code blue. There was no family present at bedside. Ch provided emotional support to staff. No follow-up needed at this time.

## 2022-10-03 NOTE — IPAL (Signed)
  Interdisciplinary Goals of Care Family Meeting   Date carried out: 09/08/2022  Location of the meeting: Bedside  Member's involved: Nurse Practitioner and Family Member or next of kin  Durable Power of Attorney or acting medical decision maker: [name]   Discussion: We discussed goals of care for [name] .  [name] (husband) and [name]'s sister [name] were both at bed side. We discussed the current shock state. The concern about profound acidosis, need for emergent CRRT but also prob needs abd exploration (which she would not survive). [name] noted that she would not want to be supported on life support for extended periods of time and [name] did not feel like [name] would choose the aggressive route given the uncertainly of success and the high likelihood that we could do dialysis, surgery and she could not recover, could arrest in OR or even go through all this and find out she had a irreversible brain injury. Based on this the family [name] and Downs) have decided to forgo surgery and dialysis and we will transition to comfort care.   Code status:   Full DNR  Disposition: In-patient comfort care  Time spent for the meeting: 43 min     Shelby Mattocks, NP  09/05/2022, 8:54 AM

## 2022-10-03 NOTE — Procedures (Signed)
Central Venous Catheter Insertion Procedure Note  JAYNAE MASARIK  [id number]  [birth year]  Date:09/07/2022  Time:2:31 AM   Provider Performing: Celine Mans   Procedure: Insertion of Non-tunneled Central Venous Catheter(36556) with US guidance ([id number])   Indication(s) Medication administration and Difficult access  Consent Unable to obtain consent due to emergent nature of procedure.  Anesthesia Topical only with 1% lidocaine   Timeout Verified patient identification, verified procedure, site/side was marked, verified correct patient position, special equipment/implants available, medications/allergies/relevant history reviewed, required imaging and test results available.  Sterile Technique Maximal sterile technique including full sterile barrier drape, hand hygiene, sterile gown, sterile gloves, mask, hair covering, sterile ultrasound probe cover (if used).  Procedure Description Area of catheter insertion was cleaned with chlorhexidine and draped in sterile fashion.  With real-time ultrasound guidance a central venous catheter was placed into the right femoral vein. Nonpulsatile blood flow and easy flushing noted in all ports.  The catheter was sutured in place and sterile dressing applied.  Complications/Tolerance None; patient tolerated the procedure well. Chest X-ray is ordered to verify placement for internal jugular or subclavian cannulation.   Chest x-ray is not ordered for femoral cannulation.  EBL Minimal  Specimen(s) None   Rutherford Guys, PA - [facility] For pager details, please see AMION or use Epic chat  After 1900, please call [facility] for cross coverage needs 09/22/2022, 2:32 AM

## 2022-10-03 NOTE — Consult Note (Signed)
NAME:  [name], MRN:  [id number], DOB:  09-08-1950, LOS: 1 ADMISSION DATE:  09/09/2022, CONSULTATION DATE:  09/04/2022 REFERRING MD:  Idelle Leech CHIEF COMPLAINT:  Cardiac Arrest   History of Present Illness:  Pt is encephelopathic; therefore, this HPI is obtained from chart review. [name] is a 72 y.o. female who has a PMH as below including but not limited to chronic constipation, IBS, diverticulosis, intermittent dysphagia, chronic opioid intake. She presented to [facility] ED 8/8 with worsening abdominal distention for a few days. She typically has BM every other day but needs to take Miralax daily; however, she had not had any BM's x 4 days despite Miralax. She also experienced abdominal pain and nausea.  In ED, she had CT A/P that demonstrated pseudo obstruction / Ogilvie Syndrome. GI was consulted and recommended SMOG enema to start and Mestinon if no improvement.  She was admitted by [facility] and was admitted to the floor. Early AM 8/9, she was found slumped over on the floor in PEA then asystole. She received 11 minutes of CPR before ROSC and was given Epi/Mag/Bicarb during the code. She had received 1mg  Dilaudid roughly 1.5 - 2 hours prior to her being found slumped over.  She was intubated during the code and was then transferred to the ICU for ongoing management. After arrival to ICU, her SBP was noted to be low in the 50's. She was started on Levophed with gradual improvement. ABG demonstrated profound mixed acidosis. Her vent rate was increased from 24 to 30 and she was given 2amps HCO3 while a HCO3 infusion was ordered. She had CVL and art line placed.  Her family has not been able to be reached yet despite primary and code team calling. We will attempt a 3rd time.  Pertinent  Medical History:  has Pelvic hematoma, female; Pancreatitis; DISC DISEASE, CERVICAL; HEADACHE, CHRONIC; DEPRESSION, HX OF; Spinal stenosis, lumbar region, without neurogenic claudication; Spinal stenosis at L4-L5 level;  Anemia; Chronic back pain; Hiatal hernia; Herpes simplex; S/P hysterectomy; History of DVT (deep vein thrombosis); History of UTI; Insomnia related to another mental disorder; Leg numbness; Leukocytosis; MDD (major depressive disorder), recurrent episode, mild (HCC); Migraine; Mixed incontinence; Osteoarthritis of cervical spine; Palpitations; Panic disorder without agoraphobia; PTSD (post-traumatic stress disorder); Rectocele; S/P lumbar fusion; S/P cervical spinal fusion; and Colonic pseudoobstruction on their problem list.  Significant Hospital Events: Including procedures, antibiotic start and stop dates in addition to other pertinent events   8/8 admit. 8/9 11 minutes PEA then asystole arrest, intubated, transferred to ICU  Interim History / Subjective:  Non-responsive on vent. BP Improved on Levo. Has had CVL and art line placed.  Objective:  Blood pressure (!) 92/58, pulse (!) 101, temperature 97.7 F (36.5 C), temperature source Oral, resp. rate 18, height 5\' 5"  (1.651 m), weight 72.6 kg, SpO2 (!) 86%.    Vent Mode: PRVC FiO2 (%):  [100 %] 100 % Set Rate:  [24 bmp-30 bmp] 30 bmp Vt Set:  [450 mL] 450 mL PEEP:  [8 cmH20] 8 cmH20 Plateau Pressure:  [39 cmH20] 39 cmH20   Intake/Output Summary (Last 24 hours) at 09/09/2022 0143 Last data filed at 09/09/2022 1835 Gross per 24 hour  Intake 1900.33 ml  Output --  Net 1900.33 ml   Filed Weights   09/09/22 0800  Weight: 72.6 kg    Examination: General: Adult female, critically ill appearing. Neuro: Not responsive despite no sedation (did receive RSI meds during intubation roughly 1 hour ago). HEENT: [place]/AT.  Pupils blown. Sclerae anicteric. ETT in place. Cardiovascular: RRR, no M/R/G.  Lungs: Respirations even and unlabored.  CTA bilaterally, No W/R/R. Abdomen: Significantly distended. BS hypoactive. Not rigid.  Musculoskeletal: No gross deformities, no edema.  Skin: Intact, warm, no rashes.  Labs/imaging personally reviewed:   CT A/P 8/8 > Pseudoobstruction/Ogilvie Syndrome with large amount of stool and fluid. CT A/P 8/9 >  CT head 8/9 >  Echo 8/9 >   Assessment & Plan:   Cardiac Arrest - unclear etiology thus far. Did receive Dilaudid but this was 1.5 - 2 hours prior to code. Of note, she is on chronic opioids for chronic pain. - F/u all labs. - Get CT head and repeat CT A/P now. - Echo in AM.  Profound mixed respiratory and metabolic acidosis - s/p 3 amps HCO3 since her code. - Start HCO3 infusion. - Vent RR increased from 24 to 30. - Repeat ABG. - Supportive care.  Acute hypoxic respiratory failure - 2/2 above. - Full vent support. - RR increased as above. - Bronchial hygiene. - Follow CXR.  Concern for anoxic injury - 2/2 above. Of note, pupils currently blown post ROSC. - Supportive care as above. - CT head when able.  Hyponatremia. AKI. Hypocalcemia. - Fluids. - 1g Ca gluconate. - Follow BMP.  Pseudo-obstruction/Ogilvie Syndrome. - GI consulted, appreciate the recs. - OGT to LIWS for now. - F/u on repeat CT A/P. - Consider Mestinon per GI.  Hx Chronic back pain, Anxiety, Depression, Fibromyalgia. - Hold PTA meds.  Best practice (evaluated daily):  Diet/type: NPO DVT prophylaxis: LMWH GI prophylaxis: H2B Lines: Central line and Arterial Line Foley:  Yes, and it is still needed Code Status:  full code Last date of multidisciplinary goals of care discussion: None yet. I did manage to reach spouse Edgar Frisk over the phone and updated him on the events and current condition of Mrs. Matassa. He is understandably upset and will be making his way to the hospital shortly.  Labs   CBC: Recent Labs  Lab 09/09/22 0813 09/09/22 1655 09/26/2022 0131  WBC 8.3 5.1  --   NEUTROABS 6.8  --   --   HGB 15.2* 12.4 10.5*  HCT 47.9* 38.9 31.0*  MCV 95.2 95.3  --   PLT 367 225  --     Basic Metabolic Panel: Recent Labs  Lab 09/09/22 0813 09/09/22 1655 09/22/2022 0131  NA 129*  --  131*   K 3.8  --  3.6  CL 91*  --   --   CO2 20*  --   --   GLUCOSE 178*  --   --   BUN 18  --   --   CREATININE 1.44* 1.68*  --   CALCIUM 8.9  --   --   MG 4.7*  --   --    GFR: Estimated Creatinine Clearance: 31.5 mL/min (A) (by C-G formula based on SCr of 1.68 mg/dL (H)). Recent Labs  Lab 09/09/22 0813 09/09/22 1655  WBC 8.3 5.1    Liver Function Tests: Recent Labs  Lab 09/09/22 0813  AST 157*  ALT 104*  ALKPHOS 109  BILITOT 0.8  PROT 7.3  ALBUMIN 3.9   Recent Labs  Lab 09/09/22 0813  LIPASE 24   No results for input(s): "AMMONIA" in the last 168 hours.  ABG    Component Value Date/Time   PHART 6.907 (LL) 09/12/2022 0131   PCO2ART 52.1 (H) 09/28/2022 0131   PO2ART 74 (L) 09/17/2022 0131  HCO3 10.5 (L) 09/30/2022 0131   TCO2 12 (L) 09/25/2022 0131   ACIDBASEDEF 22.0 (H) 09/03/2022 0131   O2SAT 82 09/03/2022 0131     Coagulation Profile: No results for input(s): "INR", "PROTIME" in the last 168 hours.  Cardiac Enzymes: No results for input(s): "CKTOTAL", "CKMB", "CKMBINDEX", "TROPONINI" in the last 168 hours.  HbA1C: No results found for: "HGBA1C"  CBG: No results for input(s): "GLUCAP" in the last 168 hours.  Review of Systems:   Unable to obtain as pt is encephalopathic.  Past Medical History:  She,  has a past medical history of Anemia, Anxiety (2000), Arthritis, Depression (2000), Family history of adverse reaction to anesthesia, Fibromyalgia, GERD (gastroesophageal reflux disease), Headache(784.0), Pneumonia, PONV (postoperative nausea and vomiting), and Seasonal allergies.   Surgical History:   Past Surgical History:  Procedure Laterality Date   ABDOMINAL HYSTERECTOMY  1984   ANTERIOR CERVICAL DECOMP/DISCECTOMY FUSION N/A 10/16/2021   Procedure: Anterior Cervical Discectomy and Fusion Cervical Three-Four;  Surgeon: Tia Alert, MD;  Location: [facility] OR;  Service: Neurosurgery;  Laterality: N/A;  3C   BACK SURGERY  2006, 2008   lower back,  fusion in l5-s1   CHOLECYSTECTOMY  2003   ERCP W/ SPHINCTEROTOMY AND BALLOON DILATION  2005   pancreatitis    LUMBAR LAMINECTOMY/DECOMPRESSION MICRODISCECTOMY N/A 05/31/2012   Procedure: MICRO LUMBAR DECOMPRESSION L4 - L5, L5 - S1 ;  Surgeon: Javier Docker, MD;  Location: [facility] ORS;  Service: Orthopedics;  Laterality: N/A;   LUMBAR LAMINECTOMY/DECOMPRESSION MICRODISCECTOMY Right 02/06/2014   Procedure: LUMBAR DECOMPRESSION L3-L4, REDO LUMBAR DECOMPRESSION L4-L5 ON THE RIGHT;  Surgeon: Javier Docker, MD;  Location: [facility] ORS;  Service: Orthopedics;  Laterality: Right;   NECK SURGERY  2001,2007   herniated disc- cervical fusion(limited side to side mobility)   POSTERIOR CERVICAL FUSION/FORAMINOTOMY N/A 06/30/2022   Procedure: Posterior cervical fusion with lateral mass fixation Cervical three-cervical seven;  Surgeon: Tia Alert, MD;  Location: [facility] OR;  Service: Neurosurgery;  Laterality: N/A;   SHOULDER ARTHROSCOPY Bilateral 2010, 2011   debridement     Social History:   reports that she quit smoking about 50 years ago. Her smoking use included cigarettes. She started smoking about 53 years ago. She has a 1.5 pack-year smoking history. She has never used smokeless tobacco. She reports that she does not drink alcohol and does not use drugs.   Family History:  Her family history is not on file.   Allergies No Known Allergies   Home Medications  Prior to Admission medications   Medication Sig Start Date End Date Taking? Authorizing Provider  acyclovir (ZOVIRAX) 200 MG capsule Take 200 mg by mouth 5 (five) times daily as needed (fever blisters). 10/07/21  Yes [provider]  Ascorbic Acid (VITAMIN C) 1000 MG tablet Take 1,000 mg by mouth daily.   Yes [provider]  Calcium Carb-Cholecalciferol (CALCIUM 600 + D PO) Take 1 tablet by mouth daily.   Yes [provider]  cetirizine (ZYRTEC) 10 MG tablet Take 10 mg by mouth daily.   Yes [provider]   Cholecalciferol (VITAMIN D) 125 MCG (5000 UT) CAPS Take 5,000 Units by mouth 2 (two) times daily.   Yes [provider]  cyclobenzaprine (FLEXERIL) 10 MG tablet Take 1 tablet by mouth 3 (three) times daily as needed. 07/21/22  Yes [provider]  docusate sodium (COLACE) 100 MG capsule Take 1 capsule (100 mg total) by mouth 2 (two) times daily as needed for  mild constipation. Patient taking differently: Take 100 mg by mouth at bedtime. 02/06/14  Yes Jene Every, MD  DULoxetine (CYMBALTA) 60 MG capsule Take 60 mg by mouth daily. 06/11/22  Yes [provider]  ferrous sulfate ER (SLOW FE) 142 (45 Fe) MG TBCR tablet Take 45 mg by mouth daily.   Yes [provider]  Ginkgo Biloba 60 MG TABS Take 60 mg by mouth in the morning and at bedtime.   Yes [provider]  glycopyrrolate (ROBINUL) 2 MG tablet Take 2 mg by mouth 2 (two) times daily.   Yes [provider]  lidocaine (LIDODERM) 5 % Place 1-3 patches onto the skin daily as needed (pain). Remove & Discard patch within 12 hours or as directed by MD   Yes [provider]  magnesium gluconate (MAGONATE) 500 MG tablet Take 500 mg by mouth daily.   Yes [provider]  Multiple Vitamins-Minerals (MULTIVITAMIN PO) Take 1 tablet by mouth daily.   Yes [provider]  naproxen sodium (ALEVE) 220 MG tablet Take 220 mg by mouth daily as needed (pain).   Yes [provider]  Omega-3 Fatty Acids (FISH OIL PO) Take 1 capsule by mouth daily.   Yes [provider]  ondansetron (ZOFRAN) 4 MG tablet Take 4 mg by mouth every 6 (six) hours as needed for nausea/vomiting. 05/02/21  Yes [provider]  oxyCODONE-acetaminophen (PERCOCET) 10-325 MG tablet Take 1 tablet by mouth every 4 (four) hours as needed for pain. 06/18/20  Yes Arman Bogus, MD  pantoprazole (PROTONIX) 40 MG tablet Take 40 mg by mouth 2 (two) times daily.    Yes [provider]   Polyethyl Glycol-Propyl Glycol (SYSTANE ULTRA OP) Place 1 drop into both eyes 2 (two) times daily as needed (dry eyes).   Yes [provider]  polyethylene glycol (MIRALAX / GLYCOLAX) packet Take 17 g by mouth daily.   Yes [provider]  Potassium 99 MG TABS Take 99 mg by mouth 2 (two) times daily.   Yes [provider]  sodium chloride (OCEAN) 0.65 % SOLN nasal spray Place 1 spray into both nostrils as needed for congestion.   Yes [provider]  TURMERIC CURCUMIN PO Take 1,000 mg by mouth daily.   Yes [provider]  vitamin B-12 (CYANOCOBALAMIN) 1000 MCG tablet Take 1,000 mcg by mouth in the morning and at bedtime.   Yes [provider]  zinc gluconate 50 MG tablet Take 50 mg by mouth daily.   Yes [provider]  methocarbamol (ROBAXIN) 500 MG tablet Take 500 mg by mouth 3 (three) times daily as needed for muscle spasms. Patient not taking: Reported on 09/09/2022    [provider]     Critical care time: 60 minutes.   Rutherford Guys, PA - [facility] For pager details, please see AMION or use Epic chat  After 1900, please call [facility] for cross coverage needs 09/13/2022, 1:43 AM

## 2022-10-03 NOTE — Progress Notes (Signed)
RT transported pt with RN at bedside from 2H12 to CT-2 and back without complication.

## 2022-10-03 NOTE — Progress Notes (Signed)
Pharmacy Antibiotic Note  [name] is a 72 y.o. female admitted on 09/09/2022 with  pseudo-obstruction .  Pharmacy has been consulted for Zosyn dosing. Pt is s/p cardiac arrest with ROSC this AM. WBC WNL. Mild renal dysfunction. Severely acidotic and elevated lactic s/p arrest.   Plan: Zosyn 3.375G IV q8h to be infused over 4 hours   Height: 5\' 5"  (165.1 cm) Weight: 72.6 kg (160 lb) IBW/kg (Calculated) : 57  Temp (24hrs), Avg:97.3 F (36.3 C), Min:96.7 F (35.9 C), Max:97.7 F (36.5 C)  Recent Labs  Lab 09/09/22 0813 09/09/22 1655 09/13/2022 0221  WBC 8.3 5.1 5.2  CREATININE 1.44* 1.68*  --   LATICACIDVEN  --   --  >9.0*    Estimated Creatinine Clearance: 31.5 mL/min (A) (by C-G formula based on SCr of 1.68 mg/dL (H)).    No Known Allergies  Abran Duke, PharmD, BCPS Clinical Pharmacist Phone: [phone]

## 2022-10-03 NOTE — Death Summary Note (Signed)
family decided to withdraw care and keep her comfortable.  Patient was started on comfort care measures, she was palliatively extubated and she passed on 28-Sep-2022 at  11:08 PM.  Patient's family was at bedside  Pertinent Labs and Studies  Significant Diagnostic Studies CT ABDOMEN PELVIS WO CONTRAST  Result Date: 09-28-22 CLINICAL DATA:  Bowel obstruction.  Cardiac arrest. EXAM: CT ABDOMEN AND PELVIS WITHOUT CONTRAST TECHNIQUE: Multidetector CT imaging of the abdomen and pelvis was performed following the standard protocol without IV contrast. RADIATION DOSE REDUCTION: This exam was performed according to the departmental dose-optimization program which includes automated exposure control, adjustment of the mA and/or kV according to patient size and/or use of iterative reconstruction technique. COMPARISON:  09/27/2022 FINDINGS: Lower chest: Bibasilar pulmonary collapse and consolidation. No pleural effusion. Cardiac size within normal limits. Nasogastric tube extends into the mid body of the stomach. Hepatobiliary: No focal liver abnormality is seen. Status post cholecystectomy. No biliary dilatation. Pancreas: Atrophic but otherwise unremarkable Spleen: Unremarkable Adrenals/Urinary Tract: The adrenal glands are unremarkable. The kidneys are normal. Foley catheter balloon seen within a decompressed bladder lumen. Stomach/Bowel: The colon a can appears diffusely dilated with the gas and largely liquid stool with the cecum and mid transverse colon measuring up to 9.5 cm in diameter, slightly progressive since prior examination. There has, additionally, developed diffuse mild dilation and fluid filling of the small bowel without a discrete point of transition suggesting a developing ileus. Trace ascites has developed. No free intraperitoneal gas. Vascular/Lymphatic: Aortic atherosclerosis. No enlarged abdominal or pelvic lymph nodes. Reproductive: Uterus and bilateral adnexa are unremarkable. Other: Right common femoral arterial and venous catheters are noted. No abdominal wall hernia. Musculoskeletal: Lumbar fusion with instrumentation of L3-S1 with posterior decompression  of L4 has been performed. No acute bone abnormality. No lytic or blastic bone lesion. IMPRESSION: 1. Progressive dilation of the colon with the gas and largely liquid stool with the cecum and mid transverse colon measuring up to 9.5 cm in diameter. There has, additionally, developed diffuse mild dilation and fluid filling of the small bowel without a discrete point of transition suggesting a developing ileus. 2. Trace ascites. 3. Bibasilar pulmonary collapse and consolidation. 4. Aortic atherosclerosis. Aortic Atherosclerosis (ICD10-I70.0). Electronically Signed   By: Helyn Numbers M.D.   On: 09/28/22 03:52   CT HEAD WO CONTRAST ( )  Result Date: 2022/09/28 CLINICAL DATA:  Cardiac arrest EXAM: CT HEAD WITHOUT CONTRAST TECHNIQUE: Contiguous axial images were obtained from the base of the skull through the vertex without intravenous contrast. RADIATION DOSE REDUCTION: This exam was performed according to the departmental dose-optimization program which includes automated exposure control, adjustment of the mA and/or kV according to patient size and/or use of iterative reconstruction technique. COMPARISON:  None Available. FINDINGS: Brain: There is no mass, hemorrhage or extra-axial collection. The size and configuration of the ventricles and extra-axial CSF spaces are normal. The brain parenchyma is normal, without acute or chronic infarction. Vascular: No abnormal hyperdensity of the major intracranial arteries or dural venous sinuses. No intracranial atherosclerosis. Skull: The visualized skull base, calvarium and extracranial soft tissues are normal. Sinuses/Orbits: No fluid levels or advanced mucosal thickening of the visualized paranasal sinuses. No mastoid or middle ear effusion. The orbits are normal. IMPRESSION: Normal head CT. Electronically Signed   By: Deatra Robinson M.D.   On: 28-Sep-2022 03:38   DG Chest Port 1 View  Result Date: 09/28/22 CLINICAL DATA:  Respiratory failure EXAM: PORTABLE CHEST  1 VIEW COMPARISON:  1:33 a.m. FINDINGS:  DEATH SUMMARY   Patient Details  Name: [name] MRN: [id number] DOB: 02-08-1950  Admission/Discharge Information   Admit Date:  09/12/2022  Date of Death: Date of Death: 09/11/22  Time of Death: Time of Death: 09/20/06  Length of Stay: 1  Referring Physician: Lytle Michaels, PA-C   Reason(s) for Hospitalization  Acute respiratory failure with hypoxia and hypercapnia S/p in-hospital PEA cardiac arrest likely due to hypoxemia Severe mixed respiratory and metabolic acidosis Severe sepsis with septic shock due to aspiration pneumonia, not POA Acute metabolic encephalopathy in the setting of acidosis and AKI AKI due to ischemic ATN in the setting of cardiac arrest Lactic acidosis Hyperkalemia/hypocalcemia/hyponatremia Pseudo colonic obstruction/Ogilvie syndrome and ileus Possible bowel ischemia Anemia of critical illness  Diagnoses  Preliminary cause of death: Withdrawal of care in the setting of multisystem organ failure Secondary Diagnoses (including complications and co-morbidities):  Principal Problem:   Colonic pseudoobstruction Active Problems:   Chronic back pain   MDD (major depressive disorder), recurrent episode, mild (HCC)   Panic disorder without agoraphobia   S/P lumbar fusion   Abdominal pain   Cardiac arrest, cause unspecified (HCC)   Metabolic acidosis   Brief Hospital Course (including significant findings, care, treatment, and services provided and events leading to death)  [name] is a 72 y.o. year old female with hx of chronic constipation, IBS, diverticulosis, intermittent dysphagia, chronic opioid intake. She presented to [facility] ED 10-Sep-2022 with worsening abdominal distention for a few days. She typically has BM every other day but needs to take Miralax daily; however, she had not had any BM's x 4 days despite Miralax. She also experienced abdominal pain and nausea.   In ED, she had CT A/P that demonstrated pseudo obstruction / Ogilvie Syndrome. GI was  consulted and recommended SMOG enema to start and Mestinon if no improvement.   She was admitted by [facility] and was admitted to the floor. Early AM 2022-09-11, she was found slumped over on the floor in PEA then asystole. She received 11 minutes of CPR before ROSC and was given Epi/Mag/Bicarb during the code. She had received 1mg  Dilaudid roughly 1.5 - 2 hours prior to her being found slumped over.   She was intubated during the code and was then transferred to the ICU for ongoing management. After arrival to ICU, her SBP was noted to be low in the 50's. She was started on Levophed with gradual improvement. ABG demonstrated profound mixed acidosis. Her vent rate was increased from 24 to 30 and she was given 2amps HCO3 while a HCO3 infusion was ordered. She had CVL and art line placed.  The setting was adjusted to clear hypercapnia, she was noted to have mixed metabolic and respiratory acidosis, she was given multiple boluses of bicarbonate, was started on bicarbonate infusion, she was continued on broad-spectrum antibiotics due to aspiration pneumonia which was evidenced on CT chest.  She became an uric as serum creatinine continue to trend up, nephrology was consulted as she was requiring high-dose vasopressor support, nephrology recommended initially to start with CRRT but her blood pressure did not improve, she was noted to have electrolyte abnormalities with hyperkalemia hypocalcemia and hyponatremia, she received hyperkalemia cocktail.  CT abdomen and pelvis suggestive of pseudo colonic obstruction and severe small bowel ileus.  There was a possibility of bowel ischemia considering she had elevated lactate.  As her condition was getting worse and she was going into multisystem organ failure, goals of care discussions were carried with family, patient's  family decided to withdraw care and keep her comfortable.  Patient was started on comfort care measures, she was palliatively extubated and she passed on 28-Sep-2022 at  11:08 PM.  Patient's family was at bedside  Pertinent Labs and Studies  Significant Diagnostic Studies CT ABDOMEN PELVIS WO CONTRAST  Result Date: 09-28-22 CLINICAL DATA:  Bowel obstruction.  Cardiac arrest. EXAM: CT ABDOMEN AND PELVIS WITHOUT CONTRAST TECHNIQUE: Multidetector CT imaging of the abdomen and pelvis was performed following the standard protocol without IV contrast. RADIATION DOSE REDUCTION: This exam was performed according to the departmental dose-optimization program which includes automated exposure control, adjustment of the mA and/or kV according to patient size and/or use of iterative reconstruction technique. COMPARISON:  09/27/2022 FINDINGS: Lower chest: Bibasilar pulmonary collapse and consolidation. No pleural effusion. Cardiac size within normal limits. Nasogastric tube extends into the mid body of the stomach. Hepatobiliary: No focal liver abnormality is seen. Status post cholecystectomy. No biliary dilatation. Pancreas: Atrophic but otherwise unremarkable Spleen: Unremarkable Adrenals/Urinary Tract: The adrenal glands are unremarkable. The kidneys are normal. Foley catheter balloon seen within a decompressed bladder lumen. Stomach/Bowel: The colon a can appears diffusely dilated with the gas and largely liquid stool with the cecum and mid transverse colon measuring up to 9.5 cm in diameter, slightly progressive since prior examination. There has, additionally, developed diffuse mild dilation and fluid filling of the small bowel without a discrete point of transition suggesting a developing ileus. Trace ascites has developed. No free intraperitoneal gas. Vascular/Lymphatic: Aortic atherosclerosis. No enlarged abdominal or pelvic lymph nodes. Reproductive: Uterus and bilateral adnexa are unremarkable. Other: Right common femoral arterial and venous catheters are noted. No abdominal wall hernia. Musculoskeletal: Lumbar fusion with instrumentation of L3-S1 with posterior decompression  of L4 has been performed. No acute bone abnormality. No lytic or blastic bone lesion. IMPRESSION: 1. Progressive dilation of the colon with the gas and largely liquid stool with the cecum and mid transverse colon measuring up to 9.5 cm in diameter. There has, additionally, developed diffuse mild dilation and fluid filling of the small bowel without a discrete point of transition suggesting a developing ileus. 2. Trace ascites. 3. Bibasilar pulmonary collapse and consolidation. 4. Aortic atherosclerosis. Aortic Atherosclerosis (ICD10-I70.0). Electronically Signed   By: Helyn Numbers M.D.   On: 09/28/22 03:52   CT HEAD WO CONTRAST ( )  Result Date: 2022/09/28 CLINICAL DATA:  Cardiac arrest EXAM: CT HEAD WITHOUT CONTRAST TECHNIQUE: Contiguous axial images were obtained from the base of the skull through the vertex without intravenous contrast. RADIATION DOSE REDUCTION: This exam was performed according to the departmental dose-optimization program which includes automated exposure control, adjustment of the mA and/or kV according to patient size and/or use of iterative reconstruction technique. COMPARISON:  None Available. FINDINGS: Brain: There is no mass, hemorrhage or extra-axial collection. The size and configuration of the ventricles and extra-axial CSF spaces are normal. The brain parenchyma is normal, without acute or chronic infarction. Vascular: No abnormal hyperdensity of the major intracranial arteries or dural venous sinuses. No intracranial atherosclerosis. Skull: The visualized skull base, calvarium and extracranial soft tissues are normal. Sinuses/Orbits: No fluid levels or advanced mucosal thickening of the visualized paranasal sinuses. No mastoid or middle ear effusion. The orbits are normal. IMPRESSION: Normal head CT. Electronically Signed   By: Deatra Robinson M.D.   On: 28-Sep-2022 03:38   DG Chest Port 1 View  Result Date: 09/28/22 CLINICAL DATA:  Respiratory failure EXAM: PORTABLE CHEST  1 VIEW COMPARISON:  1:33 a.m. FINDINGS:  DEATH SUMMARY   Patient Details  Name: [name] MRN: [id number] DOB: 02-08-1950  Admission/Discharge Information   Admit Date:  09/12/2022  Date of Death: Date of Death: 09/11/22  Time of Death: Time of Death: 09/20/06  Length of Stay: 1  Referring Physician: Lytle Michaels, PA-C   Reason(s) for Hospitalization  Acute respiratory failure with hypoxia and hypercapnia S/p in-hospital PEA cardiac arrest likely due to hypoxemia Severe mixed respiratory and metabolic acidosis Severe sepsis with septic shock due to aspiration pneumonia, not POA Acute metabolic encephalopathy in the setting of acidosis and AKI AKI due to ischemic ATN in the setting of cardiac arrest Lactic acidosis Hyperkalemia/hypocalcemia/hyponatremia Pseudo colonic obstruction/Ogilvie syndrome and ileus Possible bowel ischemia Anemia of critical illness  Diagnoses  Preliminary cause of death: Withdrawal of care in the setting of multisystem organ failure Secondary Diagnoses (including complications and co-morbidities):  Principal Problem:   Colonic pseudoobstruction Active Problems:   Chronic back pain   MDD (major depressive disorder), recurrent episode, mild (HCC)   Panic disorder without agoraphobia   S/P lumbar fusion   Abdominal pain   Cardiac arrest, cause unspecified (HCC)   Metabolic acidosis   Brief Hospital Course (including significant findings, care, treatment, and services provided and events leading to death)  [name] is a 72 y.o. year old female with hx of chronic constipation, IBS, diverticulosis, intermittent dysphagia, chronic opioid intake. She presented to [facility] ED 10-Sep-2022 with worsening abdominal distention for a few days. She typically has BM every other day but needs to take Miralax daily; however, she had not had any BM's x 4 days despite Miralax. She also experienced abdominal pain and nausea.   In ED, she had CT A/P that demonstrated pseudo obstruction / Ogilvie Syndrome. GI was  consulted and recommended SMOG enema to start and Mestinon if no improvement.   She was admitted by [facility] and was admitted to the floor. Early AM 2022-09-11, she was found slumped over on the floor in PEA then asystole. She received 11 minutes of CPR before ROSC and was given Epi/Mag/Bicarb during the code. She had received 1mg  Dilaudid roughly 1.5 - 2 hours prior to her being found slumped over.   She was intubated during the code and was then transferred to the ICU for ongoing management. After arrival to ICU, her SBP was noted to be low in the 50's. She was started on Levophed with gradual improvement. ABG demonstrated profound mixed acidosis. Her vent rate was increased from 24 to 30 and she was given 2amps HCO3 while a HCO3 infusion was ordered. She had CVL and art line placed.  The setting was adjusted to clear hypercapnia, she was noted to have mixed metabolic and respiratory acidosis, she was given multiple boluses of bicarbonate, was started on bicarbonate infusion, she was continued on broad-spectrum antibiotics due to aspiration pneumonia which was evidenced on CT chest.  She became an uric as serum creatinine continue to trend up, nephrology was consulted as she was requiring high-dose vasopressor support, nephrology recommended initially to start with CRRT but her blood pressure did not improve, she was noted to have electrolyte abnormalities with hyperkalemia hypocalcemia and hyponatremia, she received hyperkalemia cocktail.  CT abdomen and pelvis suggestive of pseudo colonic obstruction and severe small bowel ileus.  There was a possibility of bowel ischemia considering she had elevated lactate.  As her condition was getting worse and she was going into multisystem organ failure, goals of care discussions were carried with family, patient's  DEATH SUMMARY   Patient Details  Name: [name] MRN: [id number] DOB: 02-08-1950  Admission/Discharge Information   Admit Date:  09/12/2022  Date of Death: Date of Death: 09/11/22  Time of Death: Time of Death: 09/20/06  Length of Stay: 1  Referring Physician: Lytle Michaels, PA-C   Reason(s) for Hospitalization  Acute respiratory failure with hypoxia and hypercapnia S/p in-hospital PEA cardiac arrest likely due to hypoxemia Severe mixed respiratory and metabolic acidosis Severe sepsis with septic shock due to aspiration pneumonia, not POA Acute metabolic encephalopathy in the setting of acidosis and AKI AKI due to ischemic ATN in the setting of cardiac arrest Lactic acidosis Hyperkalemia/hypocalcemia/hyponatremia Pseudo colonic obstruction/Ogilvie syndrome and ileus Possible bowel ischemia Anemia of critical illness  Diagnoses  Preliminary cause of death: Withdrawal of care in the setting of multisystem organ failure Secondary Diagnoses (including complications and co-morbidities):  Principal Problem:   Colonic pseudoobstruction Active Problems:   Chronic back pain   MDD (major depressive disorder), recurrent episode, mild (HCC)   Panic disorder without agoraphobia   S/P lumbar fusion   Abdominal pain   Cardiac arrest, cause unspecified (HCC)   Metabolic acidosis   Brief Hospital Course (including significant findings, care, treatment, and services provided and events leading to death)  [name] is a 72 y.o. year old female with hx of chronic constipation, IBS, diverticulosis, intermittent dysphagia, chronic opioid intake. She presented to [facility] ED 10-Sep-2022 with worsening abdominal distention for a few days. She typically has BM every other day but needs to take Miralax daily; however, she had not had any BM's x 4 days despite Miralax. She also experienced abdominal pain and nausea.   In ED, she had CT A/P that demonstrated pseudo obstruction / Ogilvie Syndrome. GI was  consulted and recommended SMOG enema to start and Mestinon if no improvement.   She was admitted by [facility] and was admitted to the floor. Early AM 2022-09-11, she was found slumped over on the floor in PEA then asystole. She received 11 minutes of CPR before ROSC and was given Epi/Mag/Bicarb during the code. She had received 1mg  Dilaudid roughly 1.5 - 2 hours prior to her being found slumped over.   She was intubated during the code and was then transferred to the ICU for ongoing management. After arrival to ICU, her SBP was noted to be low in the 50's. She was started on Levophed with gradual improvement. ABG demonstrated profound mixed acidosis. Her vent rate was increased from 24 to 30 and she was given 2amps HCO3 while a HCO3 infusion was ordered. She had CVL and art line placed.  The setting was adjusted to clear hypercapnia, she was noted to have mixed metabolic and respiratory acidosis, she was given multiple boluses of bicarbonate, was started on bicarbonate infusion, she was continued on broad-spectrum antibiotics due to aspiration pneumonia which was evidenced on CT chest.  She became an uric as serum creatinine continue to trend up, nephrology was consulted as she was requiring high-dose vasopressor support, nephrology recommended initially to start with CRRT but her blood pressure did not improve, she was noted to have electrolyte abnormalities with hyperkalemia hypocalcemia and hyponatremia, she received hyperkalemia cocktail.  CT abdomen and pelvis suggestive of pseudo colonic obstruction and severe small bowel ileus.  There was a possibility of bowel ischemia considering she had elevated lactate.  As her condition was getting worse and she was going into multisystem organ failure, goals of care discussions were carried with family, patient's

## 2022-10-03 NOTE — Progress Notes (Signed)
NAME:  [name], MRN:  [id number], DOB:  November 08, 1950, LOS: 1 ADMISSION DATE:  09/09/2022, CONSULTATION DATE:  09/16/2022 REFERRING MD:  Idelle Leech CHIEF COMPLAINT:  Cardiac Arrest   History of Present Illness:  Pt is encephelopathic; therefore, this HPI is obtained from chart review. [name] is a 72 y.o. female who has a PMH as below including but not limited to chronic constipation, IBS, diverticulosis, intermittent dysphagia, chronic opioid intake. She presented to [facility] ED 8/8 with worsening abdominal distention for a few days. She typically has BM every other day but needs to take Miralax daily; however, she had not had any BM's x 4 days despite Miralax. She also experienced abdominal pain and nausea.  In ED, she had CT A/P that demonstrated pseudo obstruction / Ogilvie Syndrome. GI was consulted and recommended SMOG enema to start and Mestinon if no improvement.  She was admitted by [facility] and was admitted to the floor. Early AM 8/9, she was found slumped over on the floor in PEA then asystole. She received 11 minutes of CPR before ROSC and was given Epi/Mag/Bicarb during the code. She had received 1mg  Dilaudid roughly 1.5 - 2 hours prior to her being found slumped over.  She was intubated during the code and was then transferred to the ICU for ongoing management. After arrival to ICU, her SBP was noted to be low in the 50's. She was started on Levophed with gradual improvement. ABG demonstrated profound mixed acidosis. Her vent rate was increased from 24 to 30 and she was given 2amps HCO3 while a HCO3 infusion was ordered. She had CVL and art line placed.  Her family has not been able to be reached yet despite primary and code team calling. We will attempt a 3rd time.  Pertinent  Medical History:  has Pelvic hematoma, female; Pancreatitis; DISC DISEASE, CERVICAL; HEADACHE, CHRONIC; DEPRESSION, HX OF; Spinal stenosis, lumbar region, without neurogenic claudication; Spinal stenosis at L4-L5 level;  Anemia; Chronic back pain; Hiatal hernia; Herpes simplex; S/P hysterectomy; History of DVT (deep vein thrombosis); History of UTI; Insomnia related to another mental disorder; Leg numbness; Leukocytosis; MDD (major depressive disorder), recurrent episode, mild (HCC); Migraine; Mixed incontinence; Osteoarthritis of cervical spine; Palpitations; Panic disorder without agoraphobia; PTSD (post-traumatic stress disorder); Rectocele; S/P lumbar fusion; S/P cervical spinal fusion; Colonic pseudoobstruction; Abdominal pain; Cardiac arrest, cause unspecified (HCC); and Metabolic acidosis on their problem list.  Significant Hospital Events: Including procedures, antibiotic start and stop dates in addition to other pertinent events   8/8 admit. 8/9 11 minutes PEA then asystole arrest, intubated, transferred to ICU  Interim History / Subjective:  Still high dose pressors.  BP seems bicarb dependent. Surg and nephro consulted.   Objective:  Blood pressure (!) 92/59, pulse 90, temperature (!) 95.5 F (35.3 C), temperature source Axillary, resp. rate (!) 30, height 5\' 5"  (1.651 m), weight 72.6 kg, SpO2 95%.    Vent Mode: PRVC FiO2 (%):  [100 %] 100 % Set Rate:  [24 bmp-30 bmp] 30 bmp Vt Set:  [450 mL] 450 mL PEEP:  [8 cmH20] 8 cmH20 Plateau Pressure:  [39 cmH20] 39 cmH20   Intake/Output Summary (Last 24 hours) at 09/06/2022 0754 Last data filed at 09/06/2022 0700 Gross per 24 hour  Intake 3640.4 ml  Output 111 ml  Net 3529.4 ml   Filed Weights   09/09/22 0800  Weight: 72.6 kg    Examination: General critically ill 72 year old female on full support and high dose pressors  HENT NCAT orally intubated. Pupils fixed/dilated  Pulm scattered rhonchi pcxr ett good position. Low volume film R base volume loss Card RRR  Ext profoundly mottled and cool to touch  Abd distended absent bowel sounds GU min UOP Neuro opens eyes but not to request. Pupils fixed and dilated.   Assessment & Plan:    Circulatory shock s/p Cardiac Arrest - suspect acidosis driven vasoplegia. . Did receive Dilaudid but this was 1.5 - 2 hours prior to code. Of note, she is on chronic opioids for chronic pain. 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  Plan F/u echo   Acute hypoxic and hypercarbic respiratory failure - 2/2 above. Vent support maximized Plan Full vent support. RR increased as above. Bronchial hygiene. VAP bundle  PAD protocol RASS goal 0 to -1   Acute post arrest metabolic encephalopathy w/ Concern for anoxic injury - 2/2 above. Of note, pupils currently blown post ROSC. CT head negative but too early to prognosticate  Plan Cont supportive care Correct metabolic derangements Will need to consider MRI of brain if no improvement and survives  AKI in setting hypoperfusion and volume depletion  Plan Renal adjust meds  Strict I&O Nephro called. Will need CRRT  Severe lactic acidosis s/p arrest suspect element of gut ischemia contributing at this point  POC lactate > 15.  Plan Titrate pressors for MAP > 65 Serial chems F/u lacate  Starting CRRT  Fluid and electrolyte imbalance: hyperkalemia, Hypocalcemia & hyponatremia  Plan Serial labs Start CRRT  Pseudo-obstruction/Ogilvie Syndrome. Given degree of lactic acidosis I am concerned about bowel ischemia.  - GI consulted, appreciate the recs. F/u CT abd shows progressive dilation of colon w/ liquid stool in cecum. Also concern for developing ileus. No transition point Plan Cont OGT to LIWS NPO  Hx Chronic back pain, Anxiety, Depression, Fibromyalgia. Plan Hold PTA meds.  Best practice (evaluated daily):  Diet/type: NPO DVT prophylaxis: LMWH GI prophylaxis: H2B Lines: Central line and Arterial Line Foley:  Yes, and it is still needed Code Status:  full code Last date of multidisciplinary goals of care  discussion: None yet. I did manage to reach spouse Edgar Frisk over the phone and updated him on the events and current condition of [name]. He is understandably upset and will be making his way to the hospital shortly.  Critical care time: 45 min   Simonne Martinet ACNP-BC [facility] Pulmonary/Critical Care Pager # [phone] OR # [phone] if no answer

## 2022-10-03 NOTE — Anesthesia Procedure Notes (Addendum)
Procedure Name: Intubation Date/Time: 09/26/2022 12:53 AM  Performed by: Val Eagle, MDPre-anesthesia Checklist: Emergency Drugs available and Patient being monitored Oxygen Delivery Method: Ambu bag Laryngoscope Size: Mac and 3 Grade View: Grade II Tube type: Oral Tube size: 7.0 mm Number of attempts: 1 Airway Equipment and Method: Stylet and Oral airway Placement Confirmation: ETT inserted through vocal cords under direct vision, positive ETCO2 and breath sounds checked- equal and bilateral Secured at: 21 cm Tube secured with: Tape Dental Injury: Teeth and Oropharynx as per pre-operative assessment

## 2022-10-03 NOTE — Progress Notes (Signed)
Patient extubated to comfort and place on 2L [place] per order with RN at bedside.

## 2022-10-03 NOTE — Progress Notes (Signed)
Transported pt from 6N to 2H bagged pt.

## 2022-10-03 NOTE — Progress Notes (Addendum)
eLink Physician-Brief Progress Note Patient Name: [name] DOB: 10/08/1950 MRN: [id number]   Date of Service  09/12/2022  HPI/Events of Note  72 year old female with a history of fibromyalgia, IBS, diverticulosis and dysphagia who presents with severe constipation with pseudoobstruction and Ogilvie's syndrome.  Initially she was admitted to the hospitalist service.  At that time she was mildly hypotensive, tachycardic but otherwise normal saturations. Ultimately, she developed sudden onset loss of consciousness and PEA arrest and she was transferred to the ICU after being intubated on the floors.  Her laboratory studies show electrolyte derangements consistent with her oral intake, magnesium of 4.7, anion gap metabolic acidosis, and polycythemia which has since resolved.  CT abdomen reviewed consistent with pseudoobstruction/Ogilvie's.  eICU Interventions  Ground team evaluation pending, will continue to monitor  Currently not on sedation, will defer sedatives/analgesics to the primary team.   Anticipating CT abdomen/pelvis   Chest radiograph/abdominal radiograph pending  GI prophylaxis with famotidine DVT prophylaxis with enoxaparin   0320 - Persistent acidosis and electrolyte abnormalities. Scans are still pending. Add Kcl, Calcium, and increase bicarb. Repeat ABG at 5.  0410 -CT head unremarkable, CT abdomen shows further dilation of the cecum, ascending colon, and transverse colon.  Consistent with a developing ileus-no obvious transition point noted.  No intervention at this time.  Will likely need serial abdominal exams.  May be beneficial to get surgery on board this morning.  0450 -added on CBG checks every 4 hours.  New ABG reviewed with evidence of refractory metabolic acidosis although improved.  Maintain bicarbonate infusion.  Will add on every 6 BMP and lactic's.  0538 -critical magnesium at 6.5.  Discussed with nephrology, does not meet any other criteria for  emergent dialysis at this time but does have refractory acidosis with lactic acidosis greater than 9.  Exam also showing horizontal nystagmus, unsure what to make of that at this time.  Would need dialysis line if we headed that direction.  4696 -minimal urine output since 2 AM.  No urinary retention.  Add NS bolus.  Pending renal recommendations.  Likely also benefit from concurrent loop diuretics.  0701 -  Has lost her plethysmography.  She appears quite pale and hemodynamics seem to be declining an increase in norepinephrine from 1 mcg to 80 mcg over short period of time.  Will obtain ABG.  Intervention Category Evaluation Type: New Patient Evaluation    09/19/2022, 1:24 AM

## 2022-10-03 NOTE — Code Documentation (Signed)
  Patient Name: [name]   MRN: [id number]   Date of Birth/ Sex: Oct 03, 1950 , female      Admission Date: 09/09/2022  Attending Provider: Willeen Niece, MD  Primary Diagnosis: Colonic pseudoobstruction   Indication: Pt was in her usual state of health until this PM, when she was noted to be unresponsive without a pulse. Code blue was subsequently called. At the time of arrival on scene, ACLS protocol was underway.   Technical Description:  - CPR performance duration:  10 minutes  - Was defibrillation or cardioversion used? No   - Was external pacer placed? Yes  - Was patient intubated pre/post CPR? Yes   Medications Administered: Y = Yes; Blank = No Amiodarone    Atropine    Calcium    Epinephrine  Y  Lidocaine    Magnesium  Y  Norepinephrine    Phenylephrine    Sodium bicarbonate  Y  Vasopressin     Post CPR evaluation:  - Final Status - Was patient successfully resuscitated ? Yes - What is current rhythm? Normal sinus - What is current hemodynamic status? Stable but critical  Miscellaneous Information:  - Labs sent, including: none  - Primary team notified?  Attempted but unsuccessful  - Family Notified? Attempted but unsuccessful  - Additional notes/ transfer status: Transfer to 2 Heart     Katheran James, DO  09/19/2022, 1:27 AM

## 2022-10-03 DEATH — deceased

## 2022-12-14 MED FILL — Medication: Qty: 1 | Status: AC
